# Patient Record
Sex: Male | Born: 1990 | Race: White | Hispanic: No | Marital: Single | Smoking: Current every day smoker
Health system: Southern US, Community
[De-identification: ages and names within clinical notes are randomized; demographics above are authoritative.]

## PROBLEM LIST (undated history)

## (undated) HISTORY — PX: LEG SURGERY: SHX1003

## (undated) HISTORY — PX: APPENDECTOMY: SHX54

---
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
## 2009-09-01 ENCOUNTER — Emergency Department: Payer: Self-pay | Admitting: Emergency Medicine
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
## 2010-01-26 ENCOUNTER — Emergency Department: Payer: Self-pay | Admitting: Emergency Medicine
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
## 2010-03-22 ENCOUNTER — Emergency Department: Payer: Self-pay | Admitting: Emergency Medicine

## 2010-04-17 ENCOUNTER — Emergency Department: Payer: Self-pay | Admitting: Emergency Medicine

## 2010-06-01 ENCOUNTER — Emergency Department: Payer: Self-pay | Admitting: Emergency Medicine
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
## 2016-03-23 ENCOUNTER — Emergency Department: Payer: Self-pay

## 2016-03-23 ENCOUNTER — Encounter: Payer: Self-pay | Admitting: Emergency Medicine

## 2016-03-23 ENCOUNTER — Emergency Department
Admission: EM | Admit: 2016-03-23 | Discharge: 2016-03-23 | Discharge status: Against Medical Advice | Payer: Self-pay | Attending: Emergency Medicine | Admitting: Emergency Medicine

## 2016-03-23 DIAGNOSIS — R079 Chest pain, unspecified: Secondary | ICD-10-CM

## 2016-03-23 DIAGNOSIS — F1410 Cocaine abuse, uncomplicated: Secondary | ICD-10-CM | POA: Insufficient documentation

## 2016-03-23 DIAGNOSIS — F1910 Other psychoactive substance abuse, uncomplicated: Secondary | ICD-10-CM

## 2016-03-23 DIAGNOSIS — F17200 Nicotine dependence, unspecified, uncomplicated: Secondary | ICD-10-CM | POA: Insufficient documentation

## 2016-03-23 LAB — TROPONIN I: Troponin I: <0.03 ng/mL (ref <0.031)

## 2016-03-23 LAB — CBC
HCT: 48 % (ref 40.0–52.0)
Hemoglobin: 16.2 g/dL (ref 13.0–18.0)
MCH: 28.9 pg (ref 26.0–34.0)
MCHC: 33.6 g/dL (ref 32.0–36.0)
MCV: 85.8 fL (ref 80.0–100.0)
Platelets: 300 10*3/uL (ref 150–440)
RBC: 5.59 MIL/uL (ref 4.40–5.90)
RDW: 13.5 % (ref 11.5–14.5)
WBC: 12.5 10*3/uL — AB (ref 3.8–10.6)

## 2016-03-23 LAB — ETHANOL: Alcohol, Ethyl (B): <5 mg/dL (ref <5)

## 2016-03-23 LAB — URINE DRUG SCREEN, QUALITATIVE (ARMC ONLY)
AMPHETAMINES, UR SCREEN: NOT DETECTED
Barbiturates, Ur Screen: NOT DETECTED
Benzodiazepine, Ur Scrn: POSITIVE — AB
CANNABINOID 50 NG, UR ~~LOC~~: POSITIVE — AB
COCAINE METABOLITE, UR ~~LOC~~: POSITIVE — AB
MDMA (ECSTASY) UR SCREEN: NOT DETECTED
Methadone Scn, Ur: NOT DETECTED
Opiate, Ur Screen: NOT DETECTED
Phencyclidine (PCP) Ur S: NOT DETECTED
TRICYCLIC, UR SCREEN: NOT DETECTED

## 2016-03-23 LAB — COMPREHENSIVE METABOLIC PANEL
ALBUMIN: 4.7 g/dL (ref 3.5–5.0)
ALT: 19 U/L (ref 17–63)
ANION GAP: 14 (ref 5–15)
AST: 26 U/L (ref 15–41)
Alkaline Phosphatase: 63 U/L (ref 38–126)
BUN: 16 mg/dL (ref 6–20)
CO2: 21 mmol/L — AB (ref 22–32)
Calcium: 9.7 mg/dL (ref 8.9–10.3)
Chloride: 104 mmol/L (ref 101–111)
Creatinine, Ser: 1.16 mg/dL (ref 0.61–1.24)
GFR calc Af Amer: >60 mL/min (ref >60)
GFR calc non Af Amer: >60 mL/min (ref >60)
GLUCOSE: 188 mg/dL — AB (ref 65–99)
Potassium: 3.1 mmol/L — ABNORMAL LOW (ref 3.5–5.1)
SODIUM: 139 mmol/L (ref 135–145)
Total Bilirubin: 0.8 mg/dL (ref 0.3–1.2)
Total Protein: 7.9 g/dL (ref 6.5–8.1)

## 2016-03-23 IMAGING — DX DG CHEST 1V PORT
1 series · 2 of 2 positions shown · non-contrast
Comparison: None.

CLINICAL DATA: Chest pain and anxiety after drug use, tachycardia
and hypertension.

EXAM:
PORTABLE CHEST 1 VIEW

[Series 1: chest ap · 0.14mm/px · 2 of 2 slices shown]
[im 1/2]
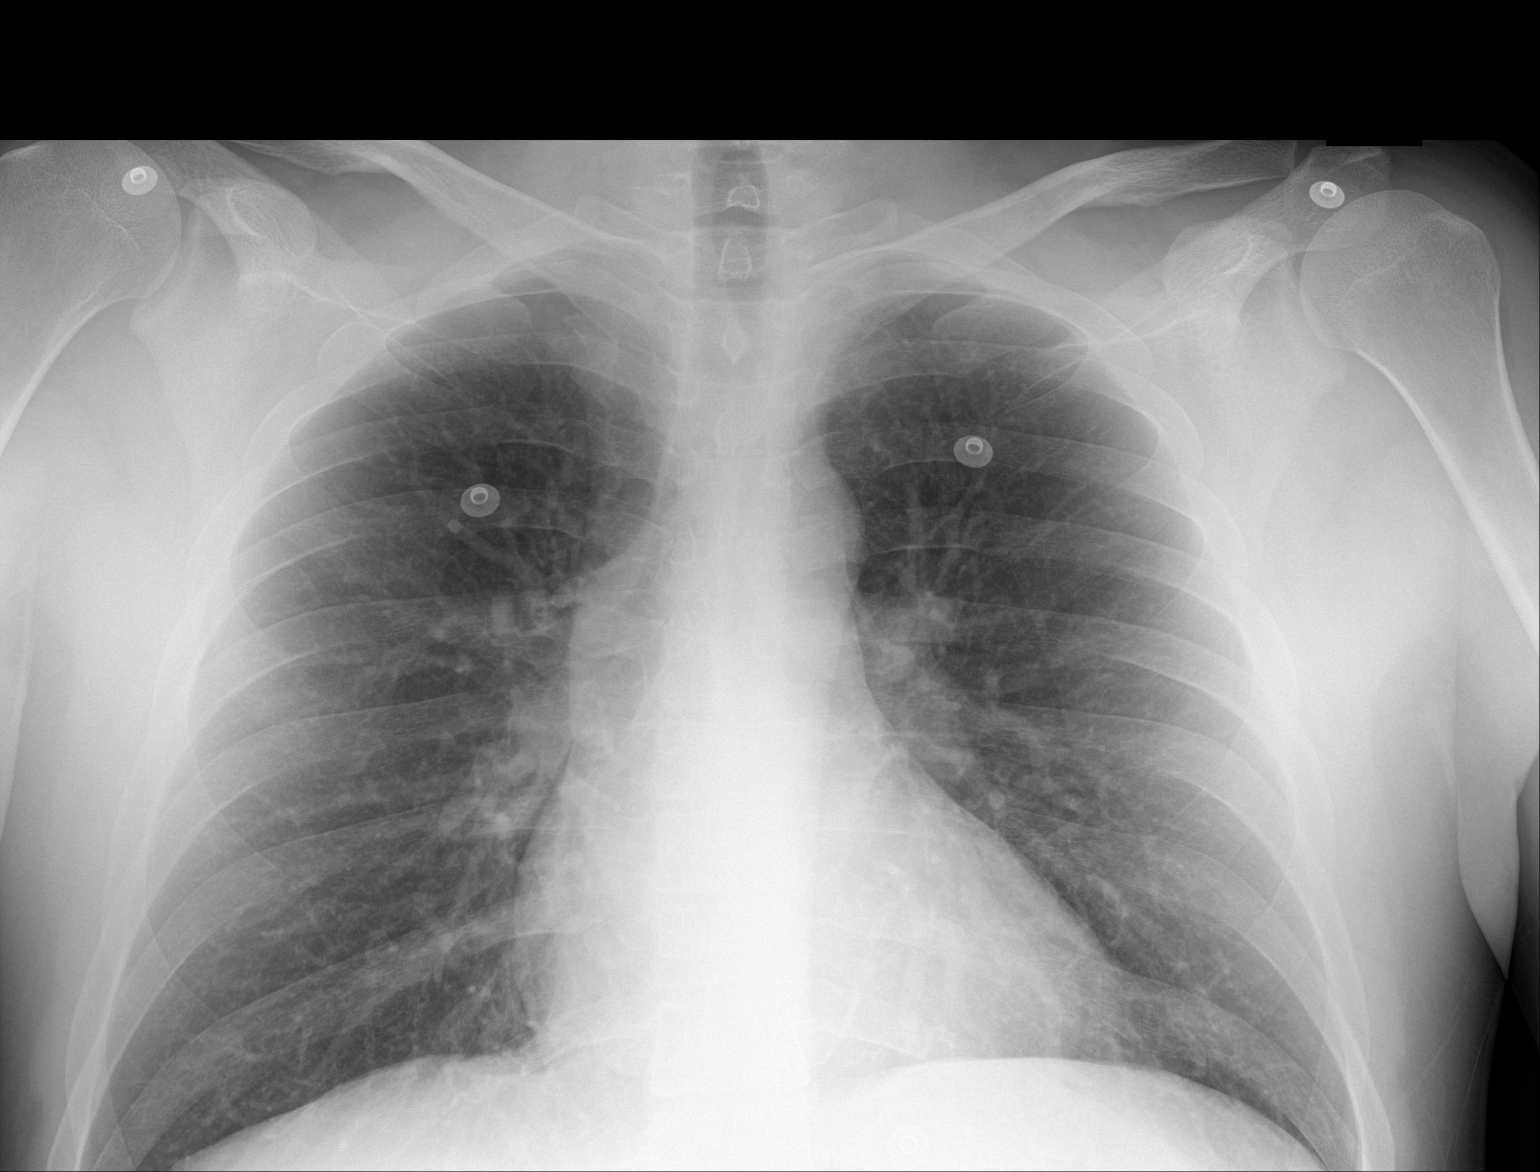
[im 2/2]
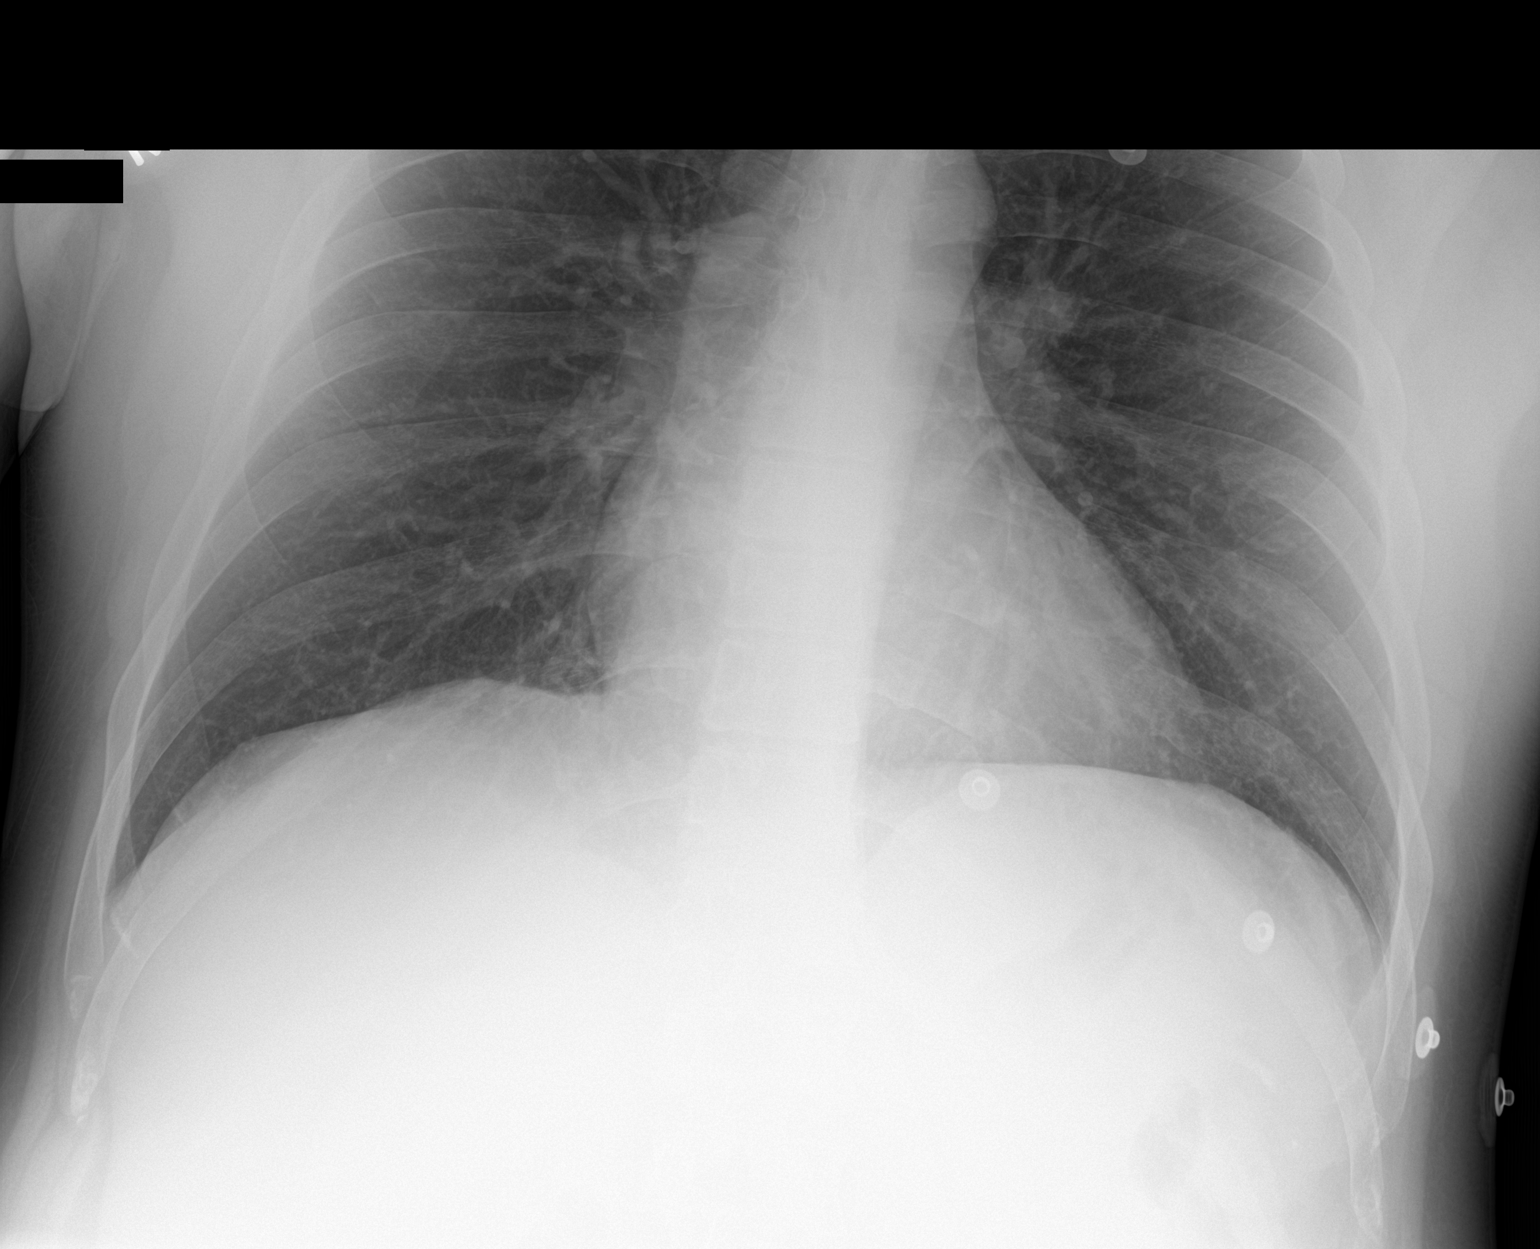

[2 of 2 positions shown; findings below may reference images not displayed]

FINDINGS: Cardiomediastinal silhouette is normal. Lung bases out of
field-of-view. The lungs are clear without pleural effusions or
focal consolidations. Trachea projects midline and there is no
pneumothorax. Soft tissue planes and included osseous structures are
non-suspicious.
IMPRESSION: Normal chest.

## 2016-03-23 MED ORDER — MAGNESIUM SULFATE 2 GM/50ML IV SOLN
2.0000 g | Freq: Once | INTRAVENOUS | Status: AC
Start: 2016-03-23 — End: 2016-03-23
  Administered 2016-03-23: 2 g via INTRAVENOUS
  Filled 2016-03-23: qty 50

## 2016-03-23 MED ORDER — LORAZEPAM 2 MG/ML IJ SOLN
1.0000 mg | Freq: Once | INTRAMUSCULAR | Status: AC
  Administered 2016-03-23: 1 mg via INTRAVENOUS

## 2016-03-23 MED ORDER — GI COCKTAIL ~~LOC~~
30.0000 mL | Freq: Once | ORAL | Status: AC
  Administered 2016-03-23: 30 mL via ORAL

## 2016-03-23 MED ORDER — SODIUM CHLORIDE 0.9 % IV BOLUS (SEPSIS)
1000.0000 mL | Freq: Once | INTRAVENOUS | Status: AC
  Administered 2016-03-23: 1000 mL via INTRAVENOUS

## 2016-03-23 MED ORDER — GI COCKTAIL ~~LOC~~
ORAL | Status: AC
  Administered 2016-03-23: 30 mL via ORAL
  Filled 2016-03-23: qty 30

## 2016-03-23 MED ORDER — LORAZEPAM 2 MG/ML IJ SOLN
1.0000 mg | Freq: Once | INTRAMUSCULAR | Status: AC
  Administered 2016-03-23: 1 mg via INTRAVENOUS
  Filled 2016-03-23: qty 1

## 2016-03-23 NOTE — ED Notes (Signed)
This Clinical research associatewriter went in for hourly rounding and pt voiced that he was ready to go home. Pt agrees to sign out AMA. edp aware and form was signed. No acute distress noted at time of leaving.

## 2016-03-23 NOTE — Discharge Instructions (Signed)
Your chest pain has not been fully evaluated. You will need a repeat troponin. 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 You understand the risk that you take with leaving the hospital and not knowing if there has been damage done to your heart from the drugs and you accept that risk when you sign out against medical advice. You may return for further/continued evaluation at any time.   Nonspecific Chest Pain  Chest pain can be caused by many different conditions. There is always a chance that your pain could be related to something serious, such as a heart attack or a blood clot in your lungs. Chest pain can also be caused by conditions that are not life-threatening. If you have chest pain, it is very important to follow up with your health care provider. CAUSES  Chest pain can be caused by:  Heartburn.  Pneumonia or bronchitis.  Anxiety or stress.  Inflammation around your heart (pericarditis) or lung (pleuritis or pleurisy).  A blood clot in your lung.  A collapsed lung (pneumothorax). It can develop suddenly on its own (spontaneous pneumothorax) or from trauma to the chest.  Shingles infection (varicella-zoster virus).  Heart attack.  Damage to the bones, muscles, and cartilage that make up your chest wall. This can include:  Bruised bones due to injury.  Strained muscles or cartilage due to frequent or repeated coughing or overwork.  Fracture to one or more ribs.  Sore cartilage due to inflammation (costochondritis). RISK FACTORS  Risk factors for chest pain may include:  Activities that increase your risk for trauma or injury to your chest.  Respiratory infections or conditions that cause frequent coughing.  Medical conditions or overeating that can cause heartburn.  Heart disease or family history of heart disease.  Conditions or health behaviors that increase your risk of developing a blood clot.  Having had chicken pox  (varicella zoster). SIGNS AND SYMPTOMS Chest pain can feel like:  Burning or tingling on the surface of your chest or deep in your chest.  Crushing, pressure, aching, or squeezing pain.  Dull or sharp pain that is worse when you move, cough, or take a deep breath.  Pain that is also felt in your back, neck, shoulder, or arm, or pain that spreads to any of these areas. Your chest pain may come and go, or it may stay constant. DIAGNOSIS Lab tests or other studies may be needed to find the cause of your pain. Your health care provider may have you take a test called an ambulatory ECG (electrocardiogram). An ECG records your heartbeat patterns at the time the test is performed. You may also have other tests, such as:  Transthoracic echocardiogram (TTE). During echocardiography, sound waves are used to create a picture of all of the heart structures and to look at how blood flows through your heart.  Transesophageal echocardiogram (TEE).This is a more advanced imaging test that obtains images from inside your body. It allows your health care provider to see your heart in finer detail.  Cardiac monitoring. This allows your health care provider to monitor your heart rate and rhythm in real time.  Holter monitor. This is a portable device that records your heartbeat and can help to diagnose abnormal heartbeats. It allows your health care provider to track your heart activity for several days, if needed.  Stress tests. These can be done through exercise or by taking medicine that makes your heart beat more quickly.  Blood tests.  Imaging tests. TREATMENT  Your treatment depends on what is causing your chest pain. Treatment may include:  Medicines. These may include:  Acid blockers for heartburn.  Anti-inflammatory medicine.  Pain medicine for inflammatory conditions.  Antibiotic medicine, if an infection is present.  Medicines to dissolve blood clots.  Medicines to treat coronary  artery disease.  Supportive care for conditions that do not require medicines. This may include:  Resting.  Applying heat or cold packs to injured areas.  Limiting activities until pain decreases. HOME CARE INSTRUCTIONS  If you were prescribed an antibiotic medicine, finish it all even if you start to feel better.  Avoid any activities that bring on chest pain.  Do not use any tobacco products, including cigarettes, chewing tobacco, or electronic cigarettes. If you need help quitting, ask your health care provider.  Do not drink alcohol.  Take medicines only as directed by your health care provider.  Keep all follow-up visits as directed by your health care provider. This is important. This includes any further testing if your chest pain does not go away.  If heartburn is the cause for your chest pain, you may be told to keep your head raised (elevated) while sleeping. This reduces the chance that acid will go from your stomach into your esophagus.  Make lifestyle changes as directed by your health care provider. These may include:  Getting regular exercise. Ask your health care provider to suggest some activities that are safe for you.  Eating a heart-healthy diet. A registered dietitian can help you to learn healthy eating options.  Maintaining a healthy weight.  Managing diabetes, if necessary.  Reducing stress. SEEK MEDICAL CARE IF:  Your chest pain does not go away after treatment.  You have a rash with blisters on your chest.  You have a fever. SEEK IMMEDIATE MEDICAL CARE IF:   Your chest pain is worse.  You have an increasing cough, or you cough up blood.  You have severe abdominal pain.  You have severe weakness.  You faint.  You have chills.  You have sudden, unexplained chest discomfort.  You have sudden, unexplained discomfort in your arms, back, neck, or jaw.  You have shortness of breath at any time.  You suddenly start to sweat, or your  skin gets clammy.  You feel nauseous or you vomit.  You suddenly feel light-headed or dizzy.  Your heart begins to beat quickly, or it feels like it is skipping beats. These symptoms may represent a serious problem that is an emergency. Do not wait to see if the symptoms will go away. Get medical help right away. Call your local emergency services (911 in the [place] Do not drive yourself to the hospital.   This information is not intended to replace advice given to you by your health care provider. Make sure you discuss any questions you have with your health care provider.   Document Released: 08/19/2005 Document Revised: 11/30/2014 Document Reviewed: 06/15/2014 Elsevier Interactive Patient Education 2016 [employer]  Polysubstance Abuse When people abuse more than one drug or type of drug it is called polysubstance or polydrug abuse. For example, many smokers also drink alcohol. This is one form of polydrug abuse. Polydrug abuse also refers to the use of a drug to counteract an unpleasant effect produced by another drug. It may also be used to help with withdrawal from another drug. People who take stimulants may become agitated. Sometimes this agitation is countered with a tranquilizer. This  helps protect against the unpleasant side effects. Polydrug abuse also refers to the use of different drugs at the same time.  Anytime drug use is interfering with normal living activities, it has become abuse. This includes problems with family and friends. Psychological dependence has developed when your mind tells you that the drug is needed. This is usually followed by physical dependence which has developed when continuing increases of drug are required to get the same feeling or "high". This is known as addiction or chemical dependency. A person's risk is much higher if there is a history of chemical dependency in the family. SIGNS OF CHEMICAL DEPENDENCY  You have been told by friends or family  that drugs have become a problem.  You fight when using drugs.  You are having blackouts (not remembering what you do while using).  You feel sick from using drugs but continue using.  You lie about use or amounts of drugs (chemicals) used.  You need chemicals to get you going.  You are suffering in work performance or in school because of drug use.  You get sick from use of drugs but continue to use anyway.  You need drugs to relate to people or feel comfortable in social situations.  You use drugs to forget problems. "Yes" answered to any of the above signs of chemical dependency indicates there are problems. The longer the use of drugs continues, the greater the problems will become. If there is a family history of drug or alcohol use, it is best not to experiment with these drugs. Continual use leads to tolerance. After tolerance develops more of the drug is needed to get the same feeling. This is followed by addiction. With addiction, drugs become the most important part of life. It becomes more important to take drugs than participate in the other usual activities of life. This includes relating to friends and family. Addiction is followed by dependency. Dependency is a condition where drugs are now needed not just to get high, but to feel normal. Addiction cannot be cured but it can be stopped. This often requires outside help and the care of professionals. Treatment centers are listed in the yellow pages under: Cocaine, Narcotics, and Alcoholics Anonymous. Most hospitals and clinics can refer you to a specialized care center. Talk to your caregiver if you need help.   This information is not intended to replace advice given to you by your health care provider. Make sure you discuss any questions you have with your health care provider.   Document Released: 07/01/2005 Document Revised: 02/01/2012 Document Reviewed: 11/14/2014 Elsevier Interactive Patient Education [employer].
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
## 2016-03-23 NOTE — ED Notes (Signed)
Pt intermittently very anxious...worried about variations in heart rate and the feeling that his heart is racing and pounding. Explained to pt that given what he took these feelings are normal and that variations in heart rate when changing positions are normal. Reassured pt that we are monitoring his vital signs very carefully and that we can see everything on his monitor out at the desk and that if there are any abnormalities with his heart rhythm we will be able to see them. Assisted pt to calm himself down with some deep breathing.  Pt more relaxed afterwards and resting with eyes closed.

## 2016-03-23 NOTE — ED Provider Notes (Signed)
[facility] Emergency Department Provider Note   ____________________________________________  Time seen: Approximately 349 AM  I have reviewed the triage vital signs and the nursing notes.   HISTORY  Chief Complaint Drug Overdose    HPI Danny Turner is a 25 y.o. male who came into the hospital today with some chest pain and tachycardia. According to EMS the patient was doing [name] in cocaine and developed some chest pain. EMS reports that his heart rate was in the 160s and his blood pressure in the 170s. The patient was given Versed 2.5 mg and 4 baby aspirin. The patient also received nitroglycerin but he still having some pain. According to EMS the patient's girlfriend died of an overdose recently.The patient reports he feels as though he can't breathe and his arms are tingling. The patient does have a history of substance abuse and also reports that he buys [name] off the street. He reports that the symptoms started about 30 minutes ago but he used about an hour and a half ago. He reports he has never used [name] before but he has done cocaine. He is very anxious and keeps grabbing his neck to feel his pulse.   History reviewed. No pertinent past medical history.  There are no active problems to display for this patient.   Past Surgical History  Procedure Laterality Date  . Appendectomy    . Leg surgery Left     Current Outpatient Rx  Name  Route  Sig  Dispense  Refill  . buprenorphine-naloxone ([name]) 8-2 MG SUBL SL tablet   Sublingual   Place 1 tablet under the tongue daily.           Allergies Review of patient's allergies indicates no known allergies.  No family history on file.  Social History Social History  Substance Use Topics  . Smoking status: Current Every Day Smoker  . Smokeless tobacco: None  . Alcohol Use: Yes    Review of Systems Constitutional: No fever/chills Eyes: No visual changes. ENT: No sore  throat. Cardiovascular:  chest pain. Respiratory:  shortness of breath. Gastrointestinal: No abdominal pain.  No nausea, no vomiting.  No diarrhea.  No constipation. Genitourinary: Negative for dysuria. Musculoskeletal: Negative for back pain. Skin: Negative for rash. Neurological: Arm tingling  10-point ROS otherwise negative.  ____________________________________________   PHYSICAL EXAM:  VITAL SIGNS: ED Triage Vitals  Enc Vitals Group     BP 03/23/16 0352 140/81 mmHg     Pulse Rate 03/23/16 0352 157     Resp 03/23/16 0352 21     Temp 03/23/16 0352 99.4 F (37.4 C)     Temp Source 03/23/16 0352 Oral     SpO2 03/23/16 0352 99 %     Weight 03/23/16 0352 185 lb (83.915 kg)     Height 03/23/16 0352 5\' 10"  (1.778 m)     Head Cir --      Peak Flow --      Pain Score 03/23/16 0353 8     Pain Loc --      Pain Edu? --      Excl. in GC? --     Constitutional: Alert and oriented. Anxious appearing and in moderate distress. Eyes: Conjunctivae are normal. PERRL. EOMI. Head: Atraumatic. Nose: No congestion/rhinnorhea. Mouth/Throat: Mucous membranes are moist.  Oropharynx non-erythematous. Cardiovascular: Tachycardia regular rhythm. Grossly normal heart sounds.  Good peripheral circulation. Respiratory: Normal respiratory effort.  No retractions. Lungs CTAB. Gastrointestinal: Soft and nontender. No distention.  Positive bowel sounds Musculoskeletal: No lower extremity tenderness nor edema.   Neurologic:  Normal speech and language.  Skin:  Skin is warm, dry and intact.  Psychiatric: Mood and affect are normal.   ____________________________________________   LABS (all labs ordered are listed, but only abnormal results are displayed)  Labs Reviewed  CBC - Abnormal; Notable for the following:    WBC 12.5 (*)    All other components within normal limits  COMPREHENSIVE METABOLIC PANEL - Abnormal; Notable for the following:    Potassium 3.1 (*)    CO2 21 (*)    Glucose, Bld  188 (*)    All other components within normal limits  URINE DRUG SCREEN, QUALITATIVE ([facility] ONLY) - Abnormal; Notable for the following:    Cocaine Metabolite,Ur [place] POSITIVE (*)    Cannabinoid 50 Ng, Ur [place] POSITIVE (*)    Benzodiazepine, Ur [place] POSITIVE (*)    All other components within normal limits  TROPONIN I  ETHANOL   ____________________________________________  EKG  ED ECG REPORT I, [name],  Allison P, the attending physician, personally viewed and interpreted this ECG.   Date: 03/23/2016  EKG Time: 351  Rate: 157  Rhythm: sinus tachycardia  Axis: normal  Intervals:prolonged qtc  ST&T Change: none  ____________________________________________  RADIOLOGY  CXR: Normal chest ____________________________________________   PROCEDURES  Procedure(s) performed: None  Critical Care performed: No  ____________________________________________   INITIAL IMPRESSION / ASSESSMENT AND PLAN / ED COURSE  Pertinent labs & imaging results that were available during my care of the patient were reviewed by me and considered in my medical decision making (see chart for details).  This is a 25 year old male who comes into the hospital today with some chest pain and tachycardia after doing illegal substances. The patient did receive a liter of normal saline as well as 2 mg of Ativan. I will await the results of his blood work and give him some more fluids.  After the fluid and the Ativan the patient's heart rate did begin to improve. He did remain anxious though while in the emergency department. He called myself and the nurse and his room multiple times asking what we were doing and why weren't doing anything for him. I did explain to the patient that he's having these symptoms because of the drugs that he is used. I informed him that the anxiety is due to the new medication and we are evaluating and following his heart. The patient reports that 2 of Ativan is not enough for him and  it will not do anything to help with his symptoms. I again continued to encourage the patient that it is what he needs and I do not want him to be too sleepy given that he had done so many different illegal substances. The patient continued to have improvement in his heart rate although every time he moved or looked at the monitor he became more anxious. The patient began complaining of some heartburn symptoms so I did give him a GI cocktail. I informed him that we needed to repeat his troponin at 3 hours to ensure that he did not damage his heart using drugs. The patient did not have any complaint at that time. I did receive word from the nurse after about 15 minutes at the patient did not want to stay anymore and he wanted to go. His decision was to sign out AGAINST MEDICAL ADVICE. Patient was informed of the risks that he was taking in signing out but  he said he just wants to go home. The patient received paperwork as well as AGAINST MEDICAL ADVICE paperwork and will be discharged AGAINST MEDICAL ADVICE. ____________________________________________   FINAL CLINICAL IMPRESSION(S) / ED DIAGNOSES  Final diagnoses:  Substance abuse  Chest pain, unspecified chest pain type      NEW MEDICATIONS STARTED DURING THIS VISIT:  Discharge Medication List as of 03/23/2016  7:23 AM       Note:  This document was prepared using Dragon voice recognition software and may include unintentional dictation errors.    Rebecka Apley, MD 03/23/16 [phone]

## 2016-03-23 NOTE — ED Notes (Signed)
Pt attempting to urinate in urinal. ivf continue to infuse.

## 2017-06-13 ENCOUNTER — Emergency Department: Payer: Self-pay

## 2017-06-13 ENCOUNTER — Encounter: Payer: Self-pay | Admitting: Emergency Medicine

## 2017-06-13 ENCOUNTER — Emergency Department
Admission: EM | Admit: 2017-06-13 | Discharge: 2017-06-13 | Disposition: A | Discharge status: Home / Self Care | Payer: Self-pay | Attending: Emergency Medicine | Admitting: Emergency Medicine

## 2017-06-13 DIAGNOSIS — F1990 Other psychoactive substance use, unspecified, uncomplicated: Secondary | ICD-10-CM

## 2017-06-13 DIAGNOSIS — F1510 Other stimulant abuse, uncomplicated: Secondary | ICD-10-CM

## 2017-06-13 DIAGNOSIS — R51 Headache: Secondary | ICD-10-CM | POA: Insufficient documentation

## 2017-06-13 DIAGNOSIS — R079 Chest pain, unspecified: Secondary | ICD-10-CM

## 2017-06-13 DIAGNOSIS — F17210 Nicotine dependence, cigarettes, uncomplicated: Secondary | ICD-10-CM | POA: Insufficient documentation

## 2017-06-13 LAB — URINE DRUG SCREEN, QUALITATIVE (ARMC ONLY)
Amphetamines, Ur Screen: POSITIVE — AB
Barbiturates, Ur Screen: NOT DETECTED
Benzodiazepine, Ur Scrn: POSITIVE — AB
CANNABINOID 50 NG, UR ~~LOC~~: NOT DETECTED
COCAINE METABOLITE, UR ~~LOC~~: NOT DETECTED
MDMA (ECSTASY) UR SCREEN: NOT DETECTED
Methadone Scn, Ur: NOT DETECTED
OPIATE, UR SCREEN: NOT DETECTED
Phencyclidine (PCP) Ur S: NOT DETECTED
Tricyclic, Ur Screen: NOT DETECTED

## 2017-06-13 LAB — COMPREHENSIVE METABOLIC PANEL
ALBUMIN: 5 g/dL (ref 3.5–5.0)
ALK PHOS: 57 U/L (ref 38–126)
ALT: 20 U/L (ref 17–63)
ANION GAP: 11 (ref 5–15)
AST: 19 U/L (ref 15–41)
BUN: 14 mg/dL (ref 6–20)
CALCIUM: 10.4 mg/dL — AB (ref 8.9–10.3)
CO2: 26 mmol/L (ref 22–32)
Chloride: 103 mmol/L (ref 101–111)
Creatinine, Ser: 1.03 mg/dL (ref 0.61–1.24)
GFR calc Af Amer: >60 mL/min (ref >60)
GFR calc non Af Amer: >60 mL/min (ref >60)
GLUCOSE: 102 mg/dL — AB (ref 65–99)
POTASSIUM: 3.6 mmol/L (ref 3.5–5.1)
SODIUM: 140 mmol/L (ref 135–145)
Total Bilirubin: 3 mg/dL — ABNORMAL HIGH (ref 0.3–1.2)
Total Protein: 8.2 g/dL — ABNORMAL HIGH (ref 6.5–8.1)

## 2017-06-13 LAB — CBC
HCT: 51.8 % (ref 40.0–52.0)
HEMOGLOBIN: 18 g/dL (ref 13.0–18.0)
MCH: 31.2 pg (ref 26.0–34.0)
MCHC: 34.9 g/dL (ref 32.0–36.0)
MCV: 89.6 fL (ref 80.0–100.0)
Platelets: 380 10*3/uL (ref 150–440)
RBC: 5.78 MIL/uL (ref 4.40–5.90)
RDW: 13.3 % (ref 11.5–14.5)
WBC: 10.6 10*3/uL (ref 3.8–10.6)

## 2017-06-13 LAB — TROPONIN I
Troponin I: <0.03 ng/mL (ref <0.03)
Troponin I: <0.03 ng/mL (ref <0.03)

## 2017-06-13 LAB — ETHANOL: Alcohol, Ethyl (B): <5 mg/dL (ref <5)

## 2017-06-13 LAB — CK: Total CK: 117 U/L (ref 49–397)

## 2017-06-13 IMAGING — CT CT HEAD W/O CM
3 series · 16 of 47 positions shown, 19 images · non-contrast
Comparison: Prior CT scan the head [DATE]

CLINICAL DATA: 26-year-old male with chest pain following several
day methamphetamine binge

EXAM:
CT HEAD WITHOUT CONTRAST
TECHNIQUE: Contiguous axial images were obtained from the base of the skull
through the vertex without intravenous contrast.

[Series 2: head wo · axial · 0.43mm/px · z∈[-135,+0]mm · 10 of 33 slices shown, 13 images]
[im 3/33  brain]
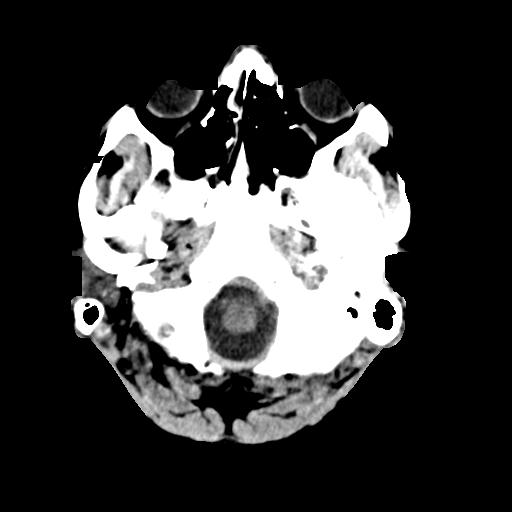
[im 3/33  bone]
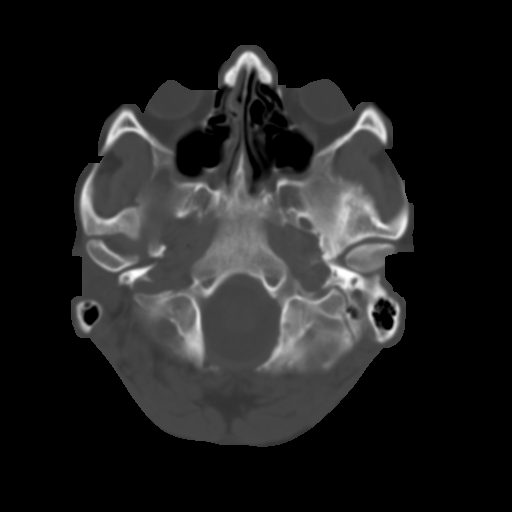
[im 6/33  brain]
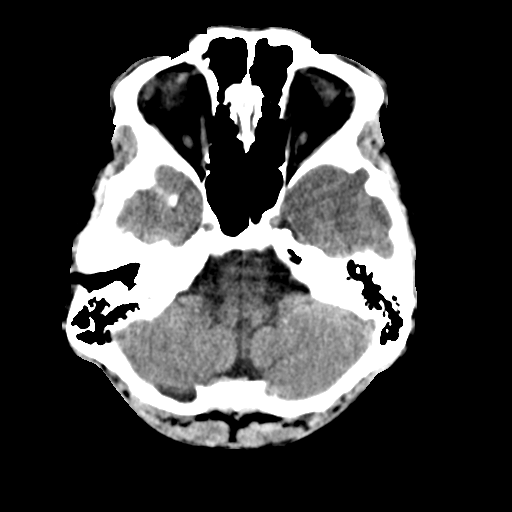
[im 9/33  brain]
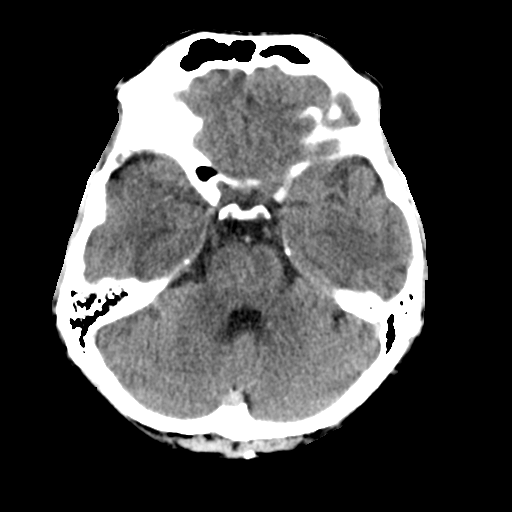
[im 12/33  brain]
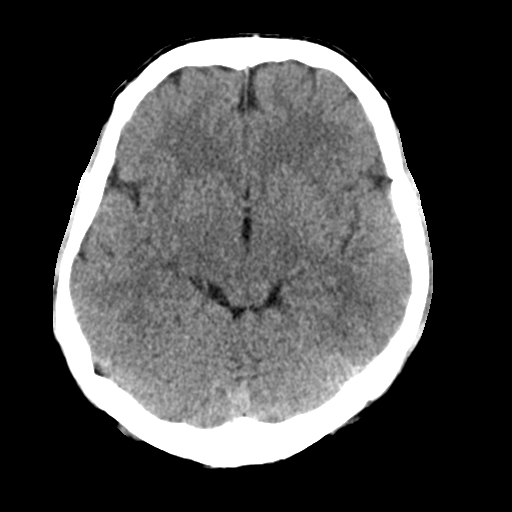
[im 15/33  brain]
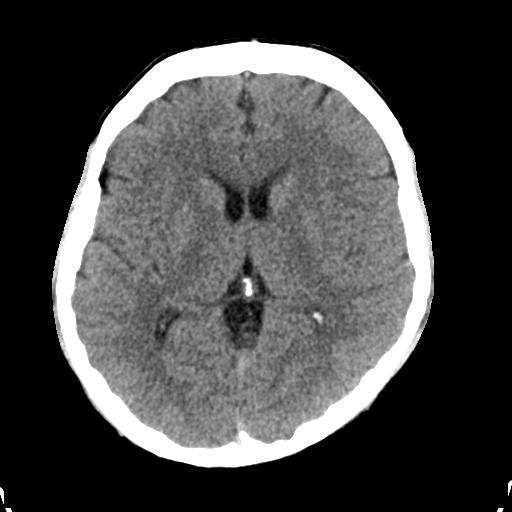
[im 15/33  bone]
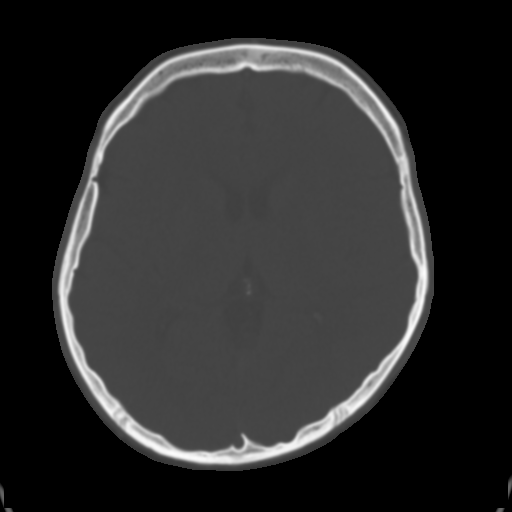
[im 18/33  brain]
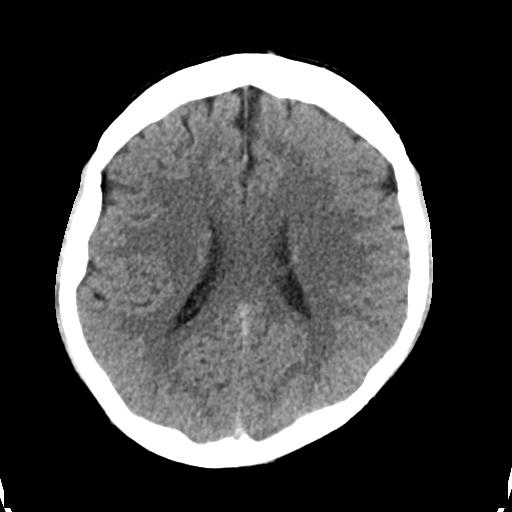
[im 21/33  brain]
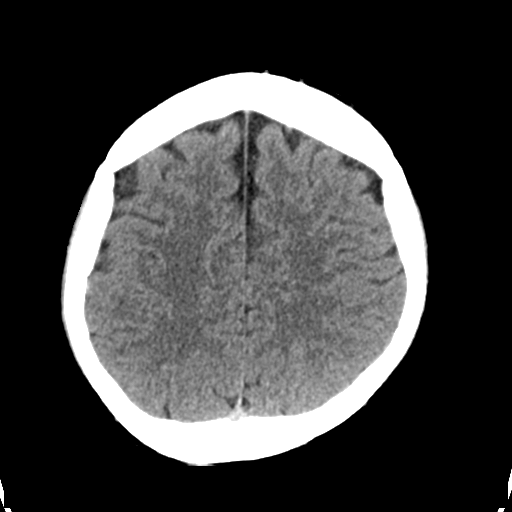
[im 25/33  brain]
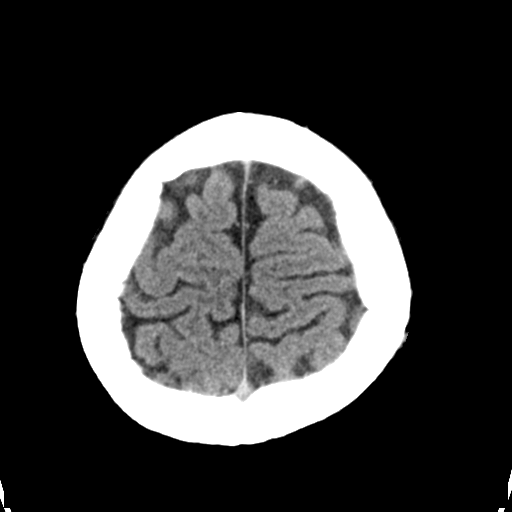
[im 27/33  brain]
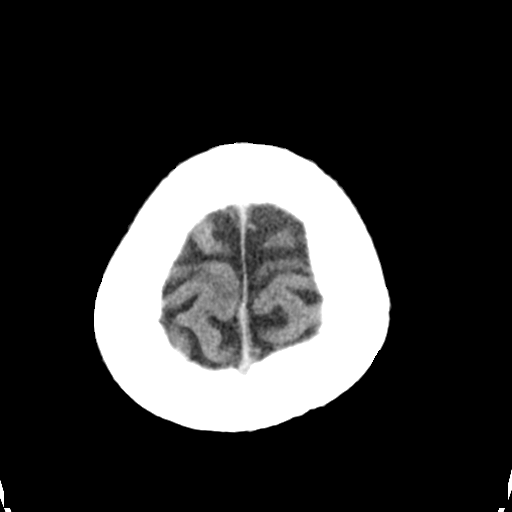
[im 27/33  bone]
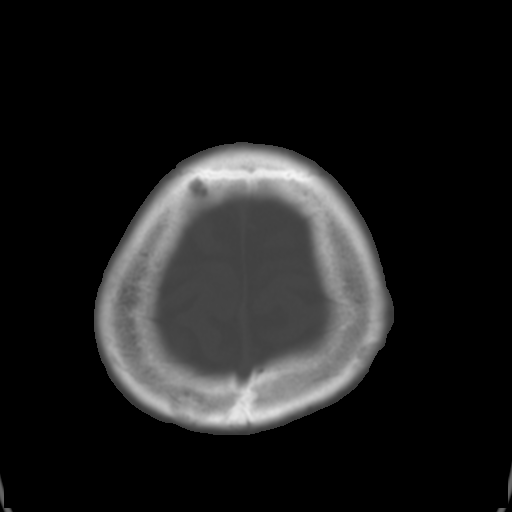
[im 30/33  brain]
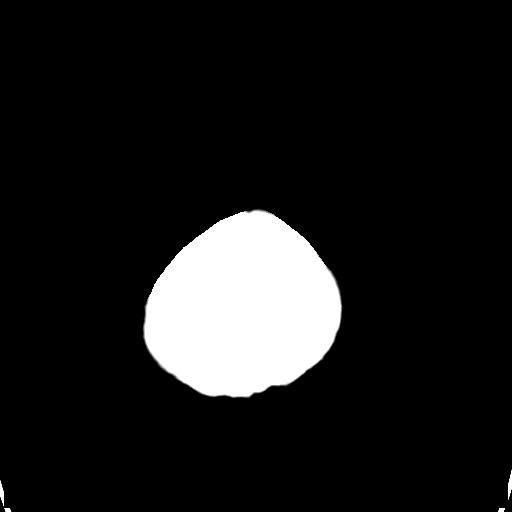

[Series 4: coronal soft tissue · coronal · 0.33mm/px · 3 of 66 slices shown]
[im 22/66  brain]
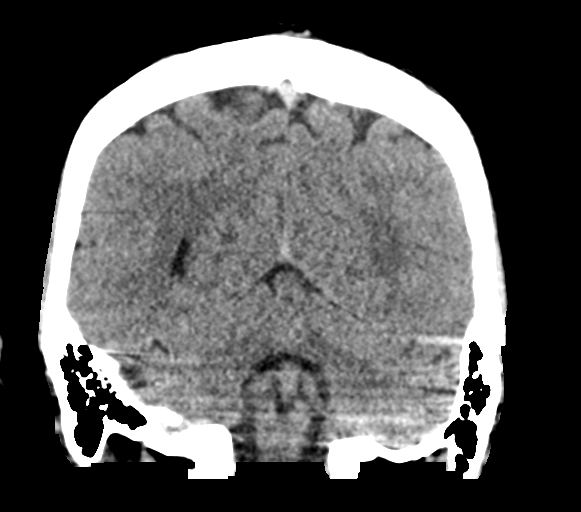
[im 29/66  brain]
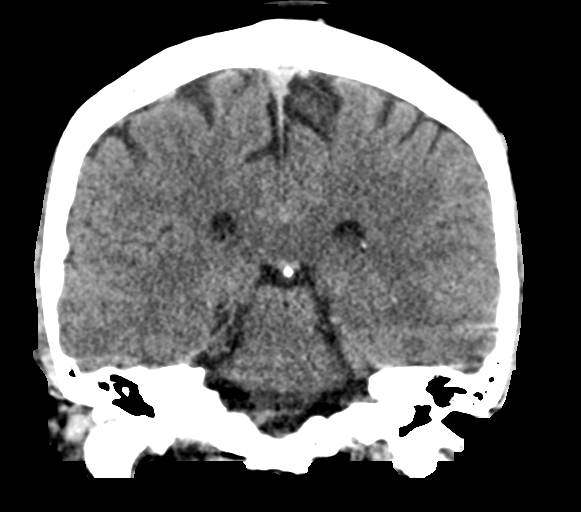
[im 37/66  brain]
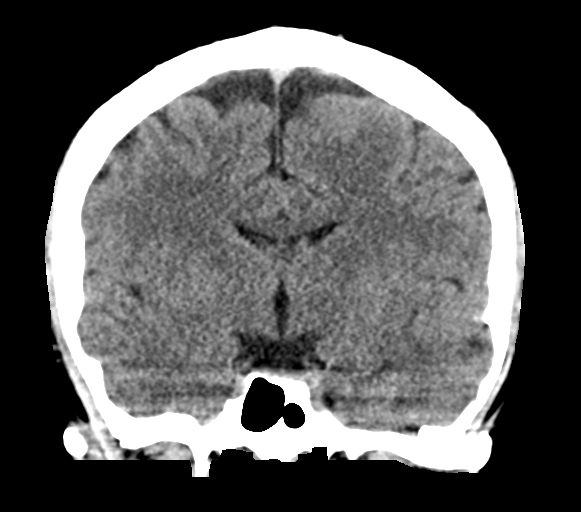

[Series 5: sagittal soft tissue · sagittal · 0.33mm/px · 3 of 60 slices shown]
[im 20/60  brain]
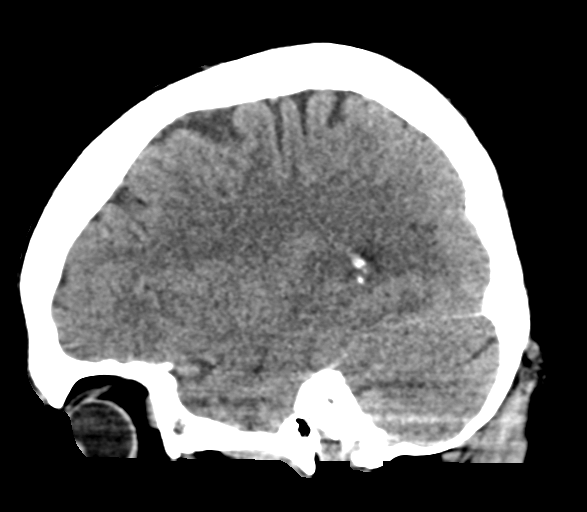
[im 30/60  brain]
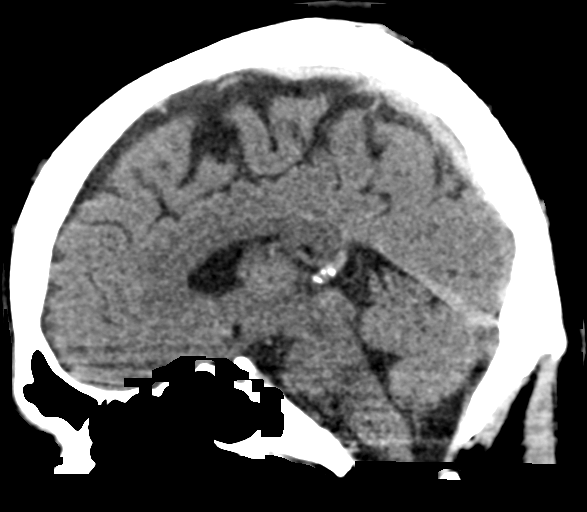
[im 40/60  brain]
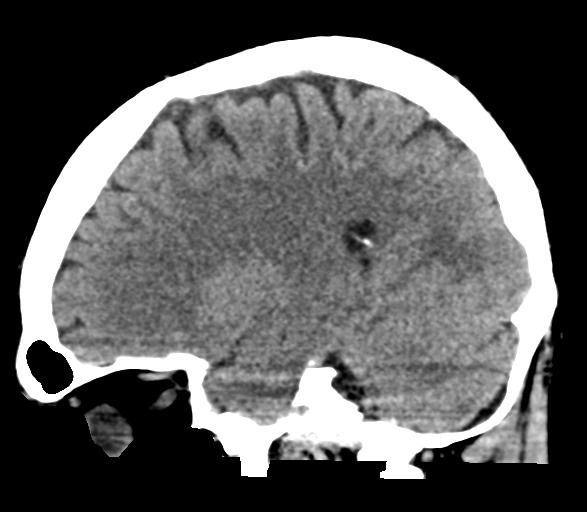

[16 of 47 positions shown; findings below may reference images not displayed]

FINDINGS: Brain: No evidence of acute infarction, hemorrhage, hydrocephalus,
extra-axial collection or mass lesion/mass effect.

Vascular: No hyperdense vessel or unexpected calcification.

Skull: Normal. Negative for fracture or focal lesion.

Sinuses/Orbits: No acute finding.

Other: None.
IMPRESSION: Negative head CT.

## 2017-06-13 IMAGING — DX DG CHEST 1V PORT
1 series · 1 of 1 positions shown · non-contrast
Comparison: Portable exam [1H] hours compared to [DATE]

CLINICAL DATA: Chest pain, was snorting and smoking meth all night

EXAM:
PORTABLE CHEST 1 VIEW

[chest ap]
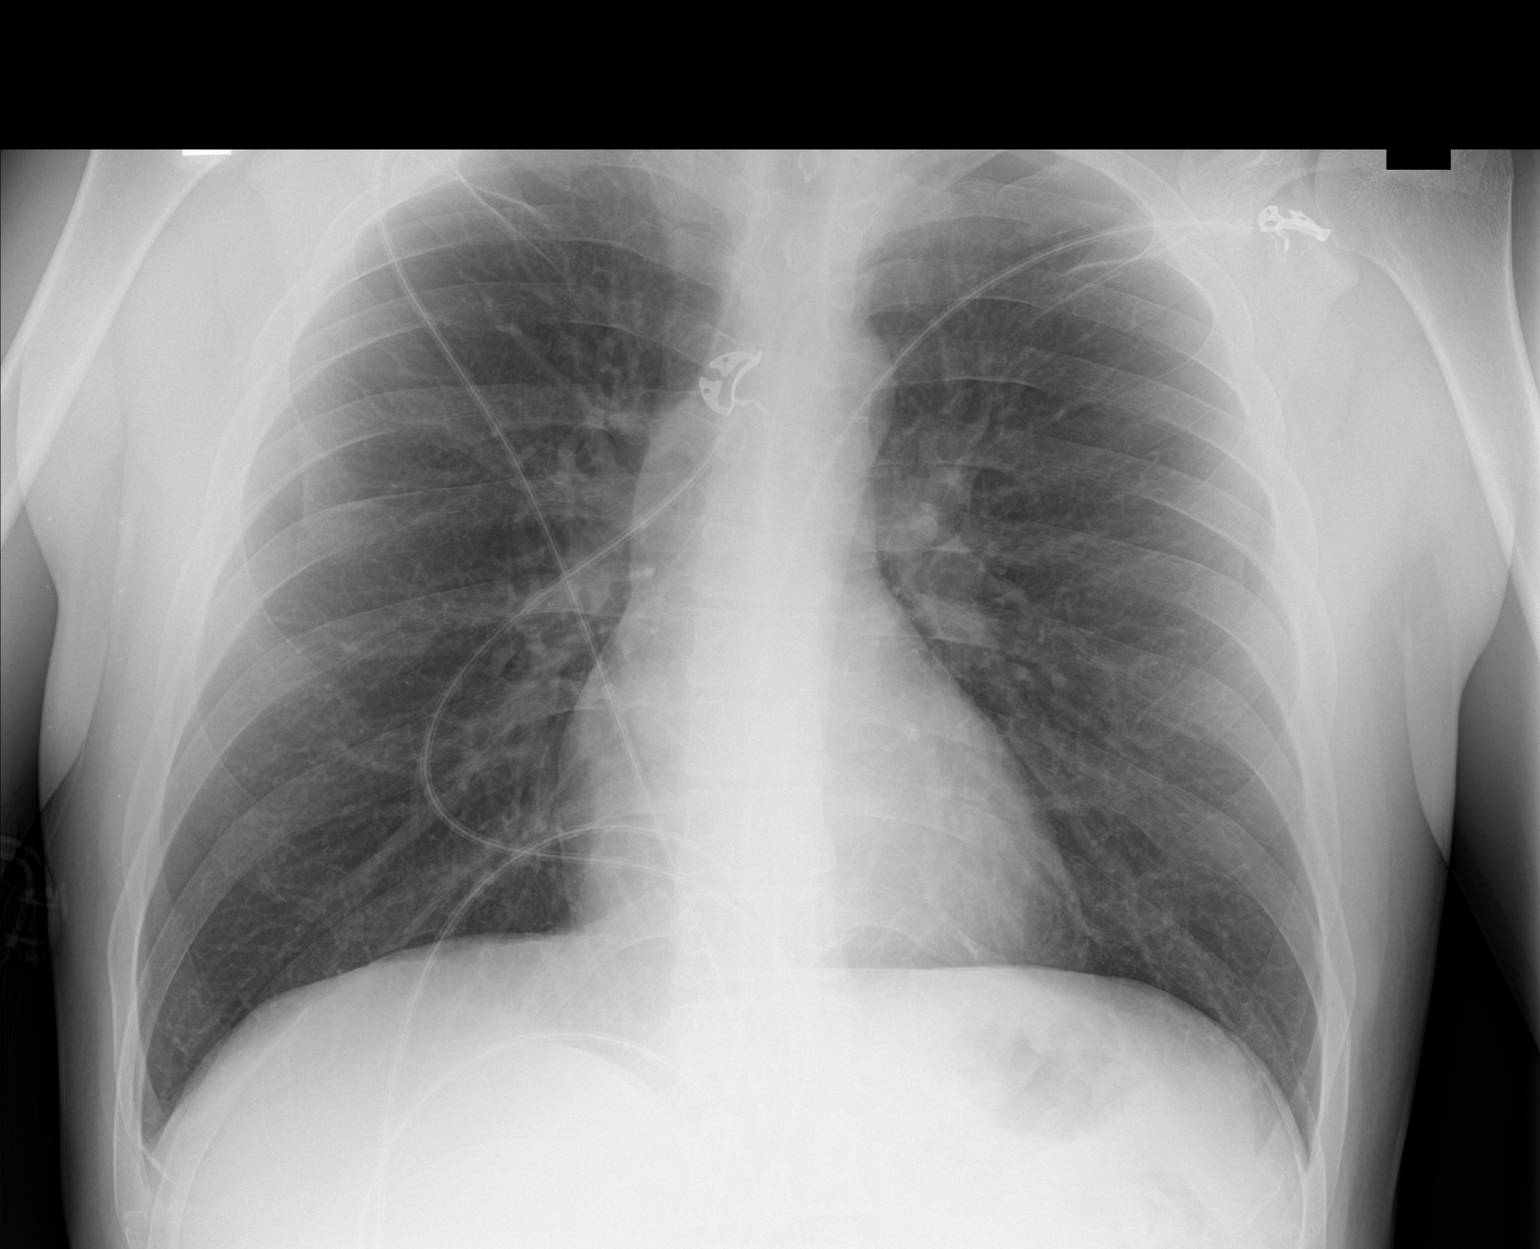

[1 of 1 positions shown; findings below may reference images not displayed]

FINDINGS: Normal heart size, mediastinal contours, and pulmonary vascularity.

Lungs clear.

No pleural effusion or pneumothorax.

Bones unremarkable.
IMPRESSION: No acute abnormalities.

## 2017-06-13 IMAGING — CT CT CHEST W/ CM
2 of 3 series · 15 of 36 positions shown, 18 images · IV contrast (iopamidol)
Comparison: Chest radiograph [DATE]

CLINICAL DATA: [place] amphetamine use over the last few days.
Headache, agitation, and chest pain. Back pain.

EXAM:
CT CHEST WITH CONTRAST
TECHNIQUE: Multidetector CT imaging of the chest was performed during
intravenous contrast administration.
CONTRAST:  75mL [LP] IOPAMIDOL ([LP]) INJECTION 61%

[Series 2: axial st · axial · 0.70mm/px · z∈[-518,-292]mm · 12 of 133 slices shown, 15 images]
[im 10/133  mediastinal]
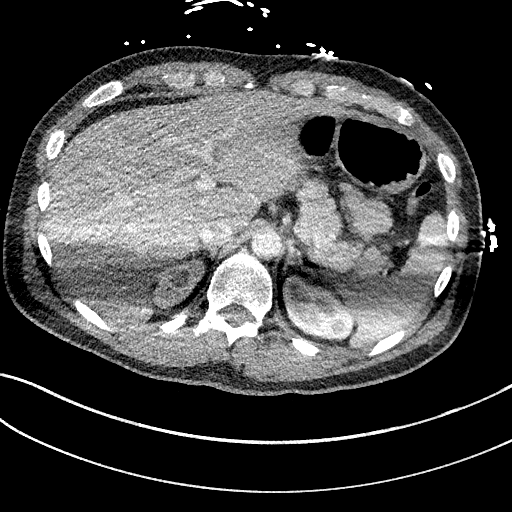
[im 10/133  lung]
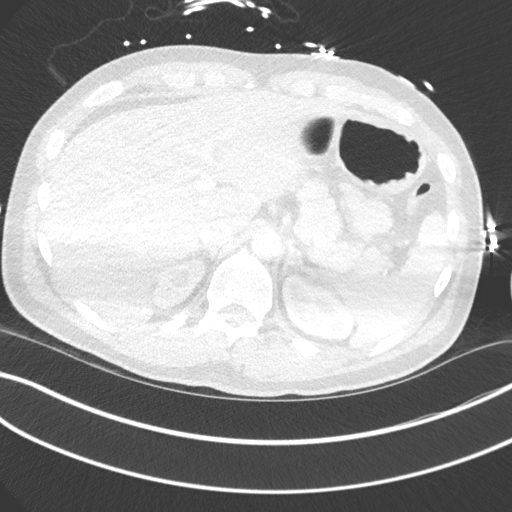
[im 20/133  lung]
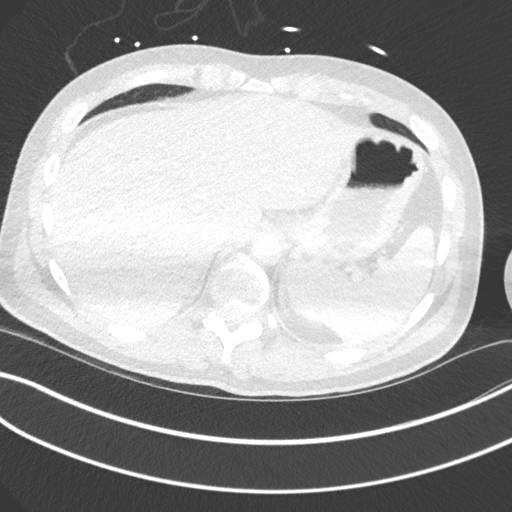
[im 30/133  lung]
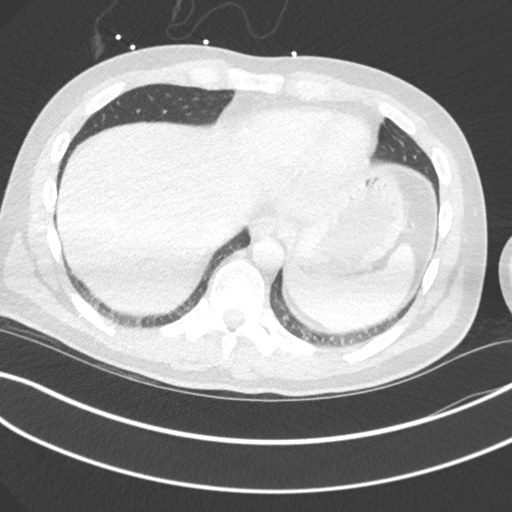
[im 40/133  lung]
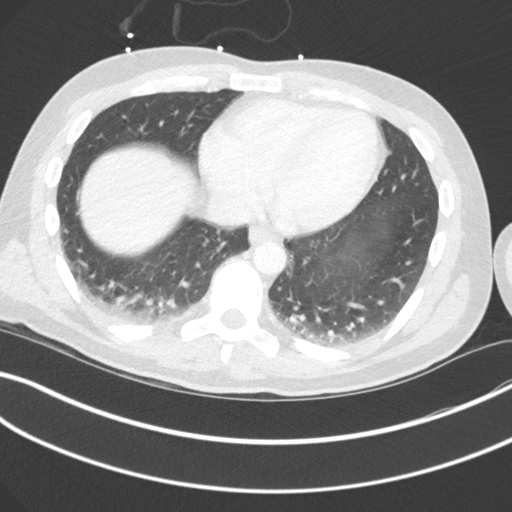
[im 49/133  mediastinal]
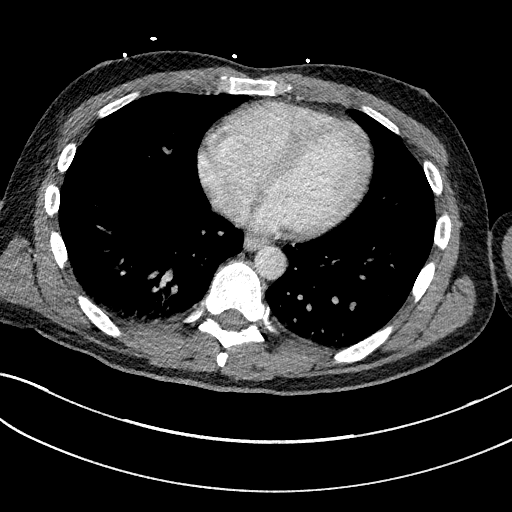
[im 49/133  lung]
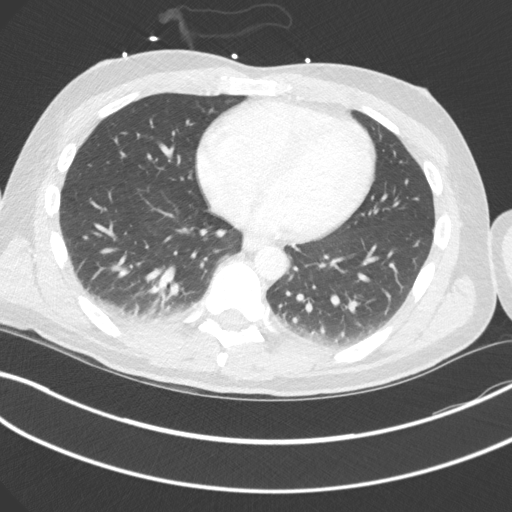
[im 59/133  lung]
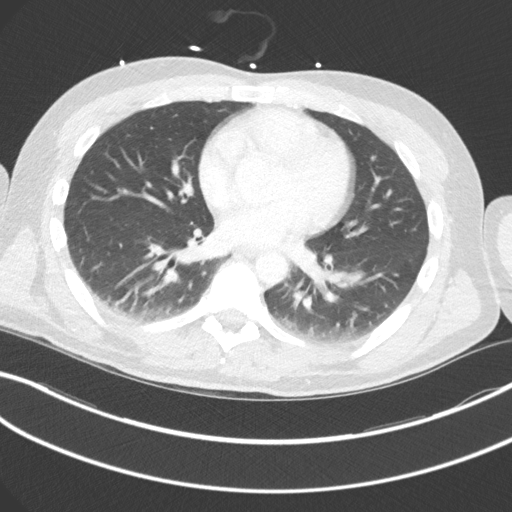
[im 74/133  lung]
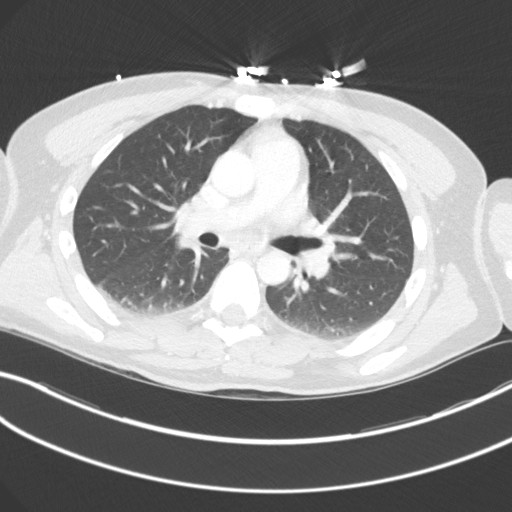
[im 84/133  lung]
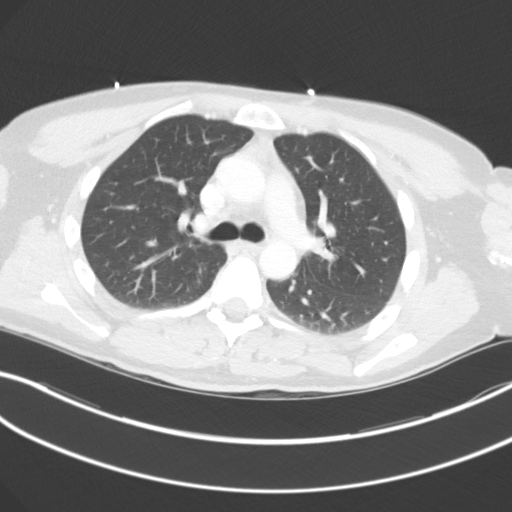
[im 93/133  mediastinal]
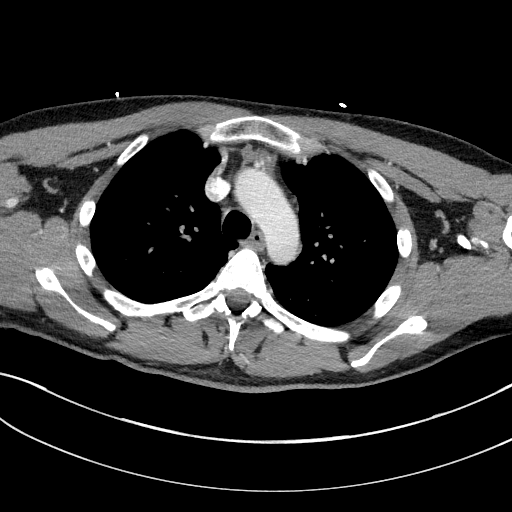
[im 93/133  lung]
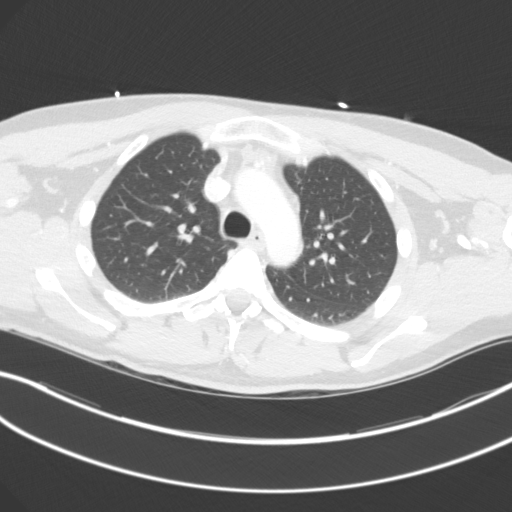
[im 103/133  lung]
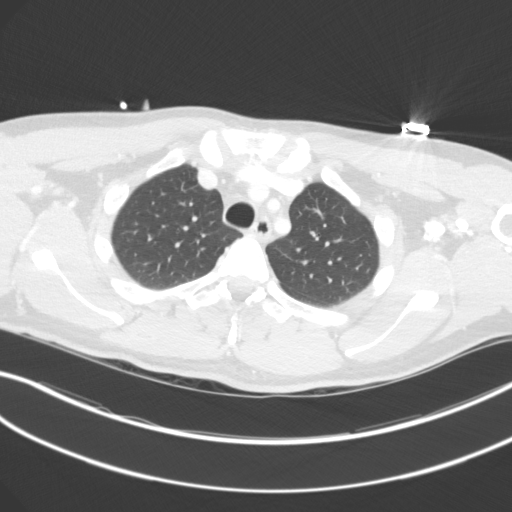
[im 113/133  lung]
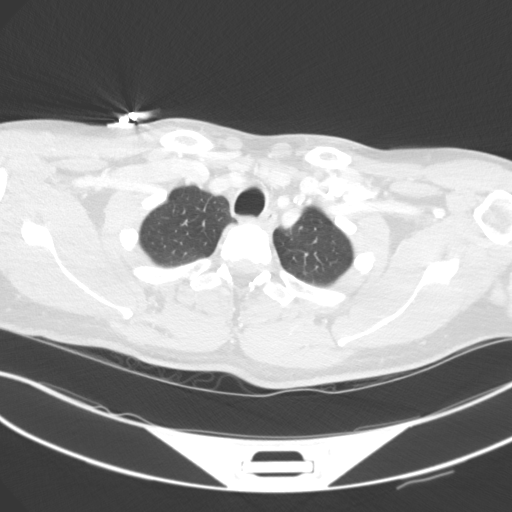
[im 123/133  lung]
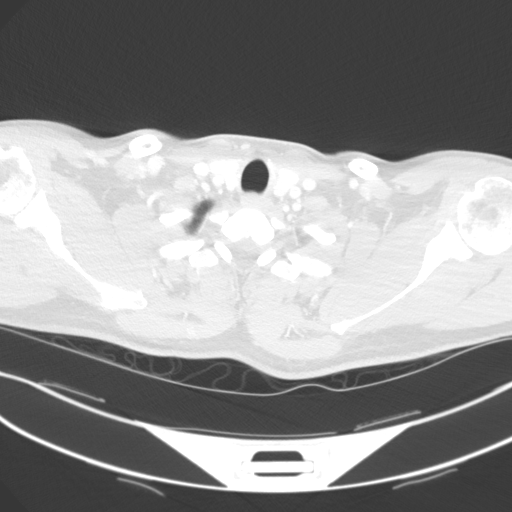

[Series 5: coronal · coronal · 0.57mm/px · 3 of 119 slices shown]
[im 24/119  lung]
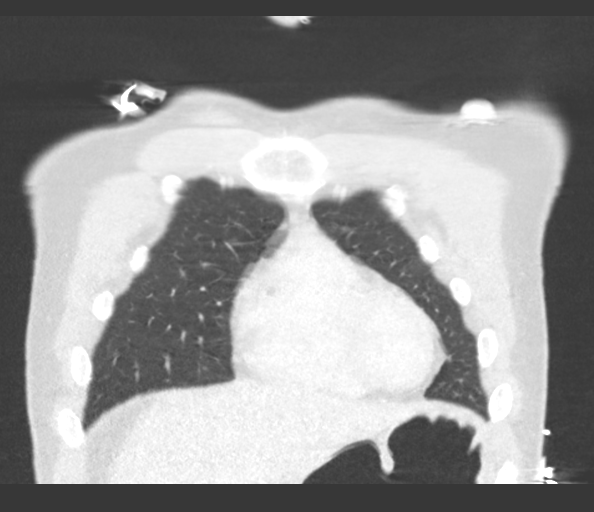
[im 48/119  lung]
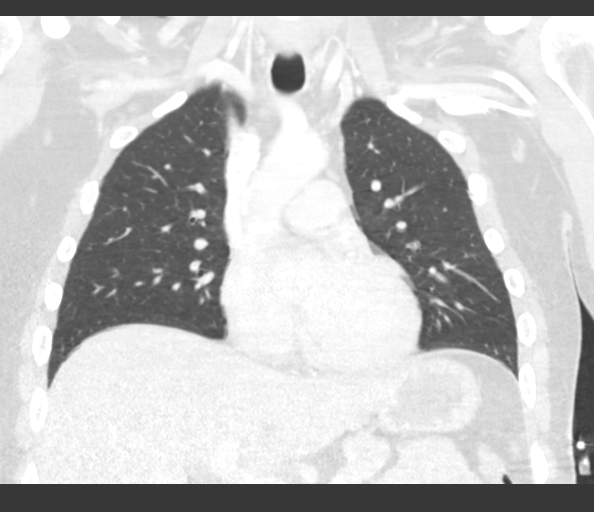
[im 71/119  lung]
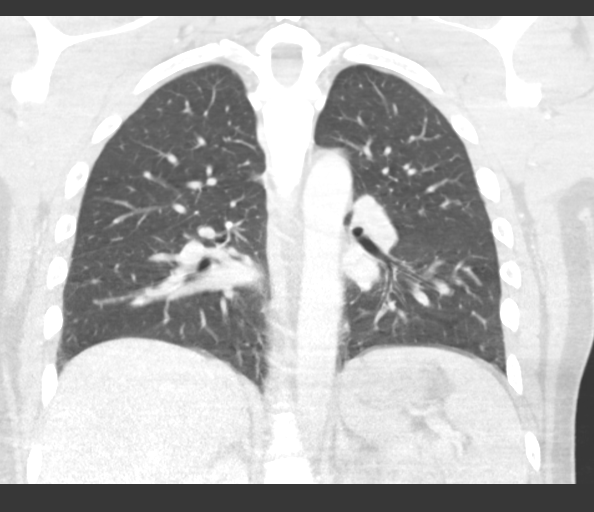

[15 of 36 positions shown; findings below may reference images not displayed]

FINDINGS: The patient was presumably unable to raise his arms for today' s
exam, introducing streak artifact which lowers diagnostic
sensitivity and specificity.

Cardiovascular: Unremarkable

Mediastinum/Nodes: Anterior mediastinal thymic tissue noted. No
pathologic adenopathy.

Lungs/Pleura: Unremarkable

Upper Abdomen: Unremarkable

Musculoskeletal: Unremarkable
IMPRESSION: 1.  No significant abnormality identified.

## 2017-06-13 MED ORDER — IOPAMIDOL (ISOVUE-300) INJECTION 61%
75.0000 mL | Freq: Once | INTRAVENOUS | Status: AC | PRN
  Administered 2017-06-13: 75 mL via INTRAVENOUS

## 2017-06-13 MED ORDER — LORAZEPAM 2 MG PO TABS
2.0000 mg | ORAL_TABLET | Freq: Once | ORAL | Status: DC
  Filled 2017-06-13: qty 1

## 2017-06-13 MED ORDER — LORAZEPAM 2 MG/ML IJ SOLN: 2.0000 mg | Freq: Once | INTRAMUSCULAR | Status: DC

## 2017-06-13 MED ORDER — LORAZEPAM 2 MG/ML IJ SOLN
INTRAMUSCULAR | Status: AC
  Filled 2017-06-13: qty 1

## 2017-06-13 MED ORDER — ASPIRIN 81 MG PO CHEW
324.0000 mg | CHEWABLE_TABLET | Freq: Once | ORAL | Status: DC
  Filled 2017-06-13: qty 4

## 2017-06-13 MED ORDER — LORAZEPAM 2 MG/ML IJ SOLN
1.0000 mg | Freq: Once | INTRAMUSCULAR | Status: AC
  Administered 2017-06-13: 1 mg via INTRAVENOUS

## 2017-06-13 MED ORDER — SODIUM CHLORIDE 0.9 % IV BOLUS (SEPSIS)
1000.0000 mL | Freq: Once | INTRAVENOUS | Status: AC
  Administered 2017-06-13: 1000 mL via INTRAVENOUS

## 2017-06-13 NOTE — ED Notes (Signed)
Called lab to inquire about status of add on troponin. States that it has not been run and placed another order

## 2017-06-13 NOTE — ED Notes (Addendum)
Pt stating that he wants help and to go to rehab facility. Pt making comments that we "are just going to send me home so I can use again". Pt advised that he should talk with psychiatrist to determine best plan of care for his wellbeing. Darci CurrentEd Tech, Jacob at bedside to assist with dress out.  Denies SI or HI.
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
## 2017-06-13 NOTE — ED Notes (Signed)
EDP at bedside to speak with patient. Pt now stating that he wants to go home.

## 2017-06-13 NOTE — ED Notes (Signed)
Informed EDP that pt would not take ordered PO medications. No further orders at this time. Pt states that he is cold, given warm blankets. Father at bedside. Pt offered again to take a sip of water, he will not attempt at this time. Updated on plan of care. Father indicates no further needs at this time. Father at bedside in attempt to help calm patient and walk him through calming breathing techniques.

## 2017-06-13 NOTE — ED Notes (Signed)
EDP at bedside. Pt cursing, complaining of chest pain. EDP applied oxygen for patient comfort. Verbal orders received.

## 2017-06-13 NOTE — ED Provider Notes (Addendum)
[facility] Emergency Department Provider Note   ____________________________________________   First MD Initiated Contact with Patient 06/13/17 [phone]     (approximate)  I have reviewed the triage vital signs and the nursing notes.   HISTORY  Chief Complaint Drug Problem and Chest Pain  History limited by agitation and lack of cooperation  HPI Danny Turner is a 26 y.o. male who says he's been snorting and smoking meth all might possibly for the last couple days. Complains of pain in his left temple complains of pain in his chest pain shooting down his neck and back at various times. When I see him he says he has pain in his left temple and pain in his chest which she cannot describe. He is very agitated and thrashing around in the bed wondering why his arms hurting when his blood pressure cuff goes off disoriented and confused. Patient given 2 mg of Ativan which promptly slows his respiratory rate heart rate from 130 to 107. Somewhat less agitated. Patient's mother and father trying to calm him down in the room. Patient had initially refused by mouth Ativan.   History reviewed. No pertinent past medical history.  There are no active problems to display for this patient.   Past Surgical History:  Procedure Laterality Date  . APPENDECTOMY    . LEG SURGERY Left     Prior to Admission medications   Not on File    Allergies Patient has no known allergies.  History reviewed. No pertinent family history.  Social History Social History  Substance Use Topics  . Smoking status: Current Every Day Smoker    Packs/day: 1.00    Types: Cigarettes  . Smokeless tobacco: Never Used  . Alcohol use Yes     Comment: daily use    Review of Systems Unable to obtain ____________________________________________   PHYSICAL EXAM:  VITAL SIGNS: ED Triage Vitals  Enc Vitals Group     BP 06/13/17 0825 (!) 132/103     Pulse Rate 06/13/17 0825 (!) 108   Resp 06/13/17 0825 16     Temp 06/13/17 0825 (!) 97.5 F (36.4 C)     Temp src --      SpO2 06/13/17 0825 100 %     Weight 06/13/17 0826 165 lb (74.8 kg)     Height 06/13/17 0826 5\' 7"  (1.702 m)     Head Circumference --      Peak Flow --      Pain Score 06/13/17 0837 8     Pain Loc --      Pain Edu? --      Excl. in GC? --     Constitutional: Alert  Agitated Eyes: Conjunctivae are normal. . Head: Atraumatic. Nose: No congestion/rhinnorhea. Mouth/Throat: Mucous membranes are moist.  Oropharynx non-erythematous. Neck: No stridor.  Cardiovascular: Rapid rate, regular rhythm. Grossly normal heart sounds.  Good peripheral circulation. Respiratory: Normal respiratory effort.  No retractions. Lungs CTAB. Gastrointestinal: Soft and nontender. No distention. No abdominal bruits. No CVA tenderness. Musculoskeletal: No lower extremity tenderness nor edema.  No joint effusions. Neurologic: Patient speaking clearly moving all extremities equally and well will not cooperate with exam Skin:  Skin is warm, dry and intact. No rash noted.   ____________________________________________   LABS (all labs ordered are listed, but only abnormal results are displayed)  Labs Reviewed  COMPREHENSIVE METABOLIC PANEL - Abnormal; Notable for the following:       Result Value   Glucose, Bld  102 (*)    Calcium 10.4 (*)    Total Protein 8.2 (*)    Total Bilirubin 3.0 (*)    All other components within normal limits  ETHANOL  CBC  TROPONIN I  CK  URINE DRUG SCREEN, QUALITATIVE ([facility] ONLY)   ____________________________________________  EKG  EKG read and interpreted by me shows sinus tachycardia rate of 119 left axis no acute ST-T wave changes  EKG #2 shows sinus tachycardia at 102 normal axis no acute ST-T wave changes ____________________________________________  RADIOLOGY  Ct Head Wo Contrast  Result Date: 06/13/2017 CLINICAL DATA:  26 year old male with chest pain following several day  methamphetamine binge EXAM: CT HEAD WITHOUT CONTRAST TECHNIQUE: Contiguous axial images were obtained from the base of the skull through the vertex without intravenous contrast. COMPARISON:  Prior CT scan the head 06/01/2010 FINDINGS: Brain: No evidence of acute infarction, hemorrhage, hydrocephalus, extra-axial collection or mass lesion/mass effect. Vascular: No hyperdense vessel or unexpected calcification. Skull: Normal. Negative for fracture or focal lesion. Sinuses/Orbits: No acute finding. Other: None. IMPRESSION: Negative head CT. Electronically Signed   By: Malachy Moan M.D.   On: 06/13/2017 10:55   Ct Chest W Contrast  Result Date: 06/13/2017 CLINICAL DATA:  [place] amphetamine use over the last few days. Headache, agitation, and chest pain. Back pain. EXAM: CT CHEST WITH CONTRAST TECHNIQUE: Multidetector CT imaging of the chest was performed during intravenous contrast administration. CONTRAST:  75mL ISOVUE-300 IOPAMIDOL (ISOVUE-300) INJECTION 61% COMPARISON:  Chest radiograph 06/13/2017 FINDINGS: The patient was presumably unable to raise his arms for today' s exam, introducing streak artifact which lowers diagnostic sensitivity and specificity. Cardiovascular: Unremarkable Mediastinum/Nodes: Anterior mediastinal thymic tissue noted. No pathologic adenopathy. Lungs/Pleura: Unremarkable Upper Abdomen: Unremarkable Musculoskeletal: Unremarkable IMPRESSION: 1.  No significant abnormality identified. Electronically Signed   By: Gaylyn Rong M.D.   On: 06/13/2017 10:55   Dg Chest Portable 1 View  Result Date: 06/13/2017 CLINICAL DATA:  Chest pain, was snorting and smoking meth all night EXAM: PORTABLE CHEST 1 VIEW COMPARISON:  Portable exam 0903 hours compared to 03/23/2016 FINDINGS: Normal heart size, mediastinal contours, and pulmonary vascularity. Lungs clear. No pleural effusion or pneumothorax. Bones unremarkable. IMPRESSION: No acute abnormalities. Electronically Signed   By: Ulyses Southward M.D.   On: 06/13/2017 09:18   CT of the head and chest are both normal ____________________________________________   PROCEDURES  Procedure(s) performed: Patient refused by mouth Ativan complaining that was giving him anything extremities do anything for him but still will not take any by mouth medicines give him 2 mg of IV Ativan. Patient calmed down some oral give him more Ativan if need be to get his CT of his head in his chest I'm unable to certain when he stopped using his meth how much meth use and if he is actually withdrawing or still intoxicated by the methamphetamine  Procedures  Critical Care performed:   ____________________________________________   INITIAL IMPRESSION / ASSESSMENT AND PLAN / ED COURSE  Pertinent labs & imaging results that were available during my care of the patient were reviewed by me and considered in my medical decision making (see chart for details).  Patient's parents tell me that he lost his child and the child's mother to drugs fairly recently. He was on methadone in [place] but could not did not want to get it when he came up here. He has been recently using [name] off the street. Has started meth also.      ____________________________________________  FINAL CLINICAL IMPRESSION(S) / ED DIAGNOSES  Final diagnoses:  Chest pain, unspecified type  Methamphetamine abuse      NEW MEDICATIONS STARTED DURING THIS VISIT:  New Prescriptions   No medications on file     Note:  This document was prepared using Dragon voice recognition software and may include unintentional dictation errors.    Arnaldo Natal, MD 06/13/17 1341 ----------------------------------------- 2:03 PM on 06/13/2017 -----------------------------------------  Patient is currently sleeping. His family reports he has slept for up to 2 days after coming off of methamphetamine in the past.   Arnaldo Natal, MD 06/13/17 1403 Patient is now awake and  does not want to stay refuses to stay he says he has been to rehabilitation multiple times knows everything there is no better he notes he just has to keep his nose in the ground and work on it. He will follow up with cardiology because he says he has chest pain occasionally even when he is sober. He is not having any now his 2 troponins are negative EKG looks okay and the CT scan of his head and chest were negative.   Arnaldo Natal, MD 06/13/17 1705  Patient confirms that he is not homicidal or suicidal at this time and has not been. He does say that the methamphetamine completely got rid of his [name] withdrawal.   Arnaldo Natal, MD 06/13/17 [phone]

## 2017-06-13 NOTE — ED Notes (Signed)
Per Dr. Darnelle CatalanMalinda, patient can receive another dose of IV ativan if he becomes anxious again.

## 2017-06-13 NOTE — Discharge Instructions (Signed)
I wish you would stay.  Please return here if you have any problems or questions or need any help with anything. We will do our best to help. As you said, make sure you continue to follow the straight and narrow path.  All of your heart tests were normal. However since you've had the chest pain and you said occasionally he has a one-year sober I would have you follow up with cardiologist. Dr. Darrold JunkerParaschos is on call his numbers above please give him a call and he should see you sometime this coming week. Again please return if you have any further problems or symptoms.

## 2017-06-13 NOTE — ED Notes (Signed)
Pt refusing to drink water, or take ordered pills.

## 2017-06-13 NOTE — ED Triage Notes (Addendum)
Pt arrived via EMS with c/o chest pain. Pt has been using meth for the past several days and also has not slept. Pt states he feels like he is having sharp pains down his back and neck.  Pt states he has been having chest pain in the middle of his chest.  Pt keeps saying that he feels like he is going to die because he is having a heart attack. Pt reassured that he did an EKG.

## 2017-06-13 NOTE — ED Notes (Addendum)
Inquiring about patient symptoms and what happened PTA, pt states that he has already told multiple people and patient refuses to tell this nurse. Pt asked about pain, states in chest but will not rate. Pt appears anxious, asking to speak with his sister and mother. PT states he is having a heart attack, "he knows it". Father at bedside of patient. Pt does report using meth, but will not elaborate further.  Pt will not attempt to drink water, pt will not attempt to sit up on his own, pt will not attempt to chew ordered aspirin.

## 2017-06-13 NOTE — ED Notes (Signed)
First Nurse Note: Pt brought in by [facility], pt is having chest pain. Pt admits to snorting and smoking meth all night. Pt states that he is having facial numbness.

## 2017-06-13 NOTE — ED Notes (Signed)
Pt awake and asking for meal tray. Pt given sandwich and drink. States he will attempt to urinate after he eats. Family at bedside.

## 2017-06-13 NOTE — ED Notes (Signed)
Pt states he has not used IV drugs in 8 years

## 2017-06-13 NOTE — ED Notes (Signed)
TTS at bedside. 

## 2017-06-13 NOTE — ED Notes (Signed)
Pt standing at door of room asking for discharge papers. Pt given papers by Darl PikesSusan, RN. Pt refused to sign.

## 2017-06-13 NOTE — ED Notes (Signed)
Pt assisted to stretcher by [employer] officer, first nurse Morrie SheldonAshley and this RN. Pt does not offer much assistance.

## 2017-06-13 NOTE — ED Notes (Addendum)
Pt mother back in room with patient. Pt continues to state that he is having a heart attack. Morrie SheldonAshley, RN at bedside to reassure patient. From nurses desk, "you f*ing b*tch" can be heard from patient.   Family apologetic

## 2020-02-04 ENCOUNTER — Emergency Department: Payer: Self-pay

## 2020-02-04 ENCOUNTER — Encounter: Payer: Self-pay | Admitting: Emergency Medicine

## 2020-02-04 ENCOUNTER — Other Ambulatory Visit: Payer: Self-pay

## 2020-02-04 ENCOUNTER — Emergency Department
Admission: EM | Admit: 2020-02-04 | Discharge: 2020-02-04 | Disposition: A | Discharge status: Home / Self Care | Payer: Self-pay | Attending: Emergency Medicine | Admitting: Emergency Medicine

## 2020-02-04 DIAGNOSIS — R202 Paresthesia of skin: Secondary | ICD-10-CM | POA: Insufficient documentation

## 2020-02-04 DIAGNOSIS — Z79899 Other long term (current) drug therapy: Secondary | ICD-10-CM | POA: Insufficient documentation

## 2020-02-04 DIAGNOSIS — R945 Abnormal results of liver function studies: Secondary | ICD-10-CM | POA: Insufficient documentation

## 2020-02-04 DIAGNOSIS — F17210 Nicotine dependence, cigarettes, uncomplicated: Secondary | ICD-10-CM | POA: Insufficient documentation

## 2020-02-04 DIAGNOSIS — R7989 Other specified abnormal findings of blood chemistry: Secondary | ICD-10-CM

## 2020-02-04 DIAGNOSIS — R200 Anesthesia of skin: Secondary | ICD-10-CM | POA: Insufficient documentation

## 2020-02-04 LAB — DIFFERENTIAL
Abs Immature Granulocytes: 0.07 10*3/uL (ref 0.00–0.07)
Basophils Absolute: 0 10*3/uL (ref 0.0–0.1)
Basophils Relative: 0 %
Eosinophils Absolute: 0.1 10*3/uL (ref 0.0–0.5)
Eosinophils Relative: 1 %
Immature Granulocytes: 1 %
Lymphocytes Relative: 17 %
Lymphs Abs: 2 10*3/uL (ref 0.7–4.0)
Monocytes Absolute: 0.7 10*3/uL (ref 0.1–1.0)
Monocytes Relative: 7 %
Neutro Abs: 8.4 10*3/uL — ABNORMAL HIGH (ref 1.7–7.7)
Neutrophils Relative %: 74 %

## 2020-02-04 LAB — HEPATITIS PANEL, ACUTE
HCV Ab: REACTIVE — AB
Hep A IgM: NONREACTIVE
Hep B C IgM: NONREACTIVE
Hepatitis B Surface Ag: NONREACTIVE

## 2020-02-04 LAB — URINE DRUG SCREEN, QUALITATIVE (ARMC ONLY)
Amphetamines, Ur Screen: POSITIVE — AB
Barbiturates, Ur Screen: NOT DETECTED
Benzodiazepine, Ur Scrn: NOT DETECTED
Cannabinoid 50 Ng, Ur ~~LOC~~: NOT DETECTED
Cocaine Metabolite,Ur ~~LOC~~: NOT DETECTED
MDMA (Ecstasy)Ur Screen: NOT DETECTED
Methadone Scn, Ur: NOT DETECTED
Opiate, Ur Screen: NOT DETECTED
Phencyclidine (PCP) Ur S: NOT DETECTED
Tricyclic, Ur Screen: NOT DETECTED

## 2020-02-04 LAB — COMPREHENSIVE METABOLIC PANEL
ALT: 266 U/L — ABNORMAL HIGH (ref 0–44)
AST: 87 U/L — ABNORMAL HIGH (ref 15–41)
Albumin: 4.3 g/dL (ref 3.5–5.0)
Alkaline Phosphatase: 41 U/L (ref 38–126)
Anion gap: 9 (ref 5–15)
BUN: 21 mg/dL — ABNORMAL HIGH (ref 6–20)
CO2: 26 mmol/L (ref 22–32)
Calcium: 9.1 mg/dL (ref 8.9–10.3)
Chloride: 103 mmol/L (ref 98–111)
Creatinine, Ser: 0.88 mg/dL (ref 0.61–1.24)
GFR calc Af Amer: >60 mL/min (ref >60)
GFR calc non Af Amer: >60 mL/min (ref >60)
Glucose, Bld: 98 mg/dL (ref 70–99)
Potassium: 3.6 mmol/L (ref 3.5–5.1)
Sodium: 138 mmol/L (ref 135–145)
Total Bilirubin: 1.9 mg/dL — ABNORMAL HIGH (ref 0.3–1.2)
Total Protein: 7.5 g/dL (ref 6.5–8.1)

## 2020-02-04 LAB — PROTIME-INR
INR: 1 (ref 0.8–1.2)
Prothrombin Time: 13.3 seconds (ref 11.4–15.2)

## 2020-02-04 LAB — LIPASE, BLOOD: Lipase: 25 U/L (ref 11–51)

## 2020-02-04 LAB — CBC
HCT: 49.3 % (ref 39.0–52.0)
Hemoglobin: 17.1 g/dL — ABNORMAL HIGH (ref 13.0–17.0)
MCH: 28.8 pg (ref 26.0–34.0)
MCHC: 34.7 g/dL (ref 30.0–36.0)
MCV: 83 fL (ref 80.0–100.0)
Platelets: 333 10*3/uL (ref 150–400)
RBC: 5.94 MIL/uL — ABNORMAL HIGH (ref 4.22–5.81)
RDW: 12.7 % (ref 11.5–15.5)
WBC: 11.3 10*3/uL — ABNORMAL HIGH (ref 4.0–10.5)
nRBC: 0 % (ref 0.0–0.2)

## 2020-02-04 LAB — TROPONIN I (HIGH SENSITIVITY)
Troponin I (High Sensitivity): <2 ng/L (ref <18)
Troponin I (High Sensitivity): <2 ng/L (ref <18)

## 2020-02-04 LAB — ETHANOL: Alcohol, Ethyl (B): <10 mg/dL (ref <10)

## 2020-02-04 LAB — ACETAMINOPHEN LEVEL: Acetaminophen (Tylenol), Serum: <10 ug/mL — ABNORMAL LOW (ref 10–30)

## 2020-02-04 LAB — GLUCOSE, CAPILLARY: Glucose-Capillary: 89 mg/dL (ref 70–99)

## 2020-02-04 LAB — APTT: aPTT: 32 seconds (ref 24–36)

## 2020-02-04 IMAGING — CT CT HEAD CODE STROKE
3 series · 16 of 47 positions shown, 19 images · non-contrast
Comparison: [DATE]

CLINICAL DATA: Code stroke. Right-sided numbness that started 3
hours ago

EXAM:
CT HEAD WITHOUT CONTRAST
TECHNIQUE: Contiguous axial images were obtained from the base of the skull
through the vertex without intravenous contrast.

[Series 3: head wo · axial · 0.40mm/px · z∈[-124,+1]mm · 10 of 31 slices shown, 13 images]
[im 3/31  brain]
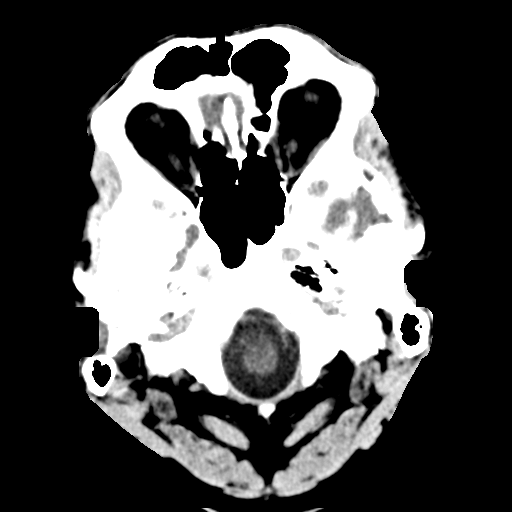
[im 3/31  bone]
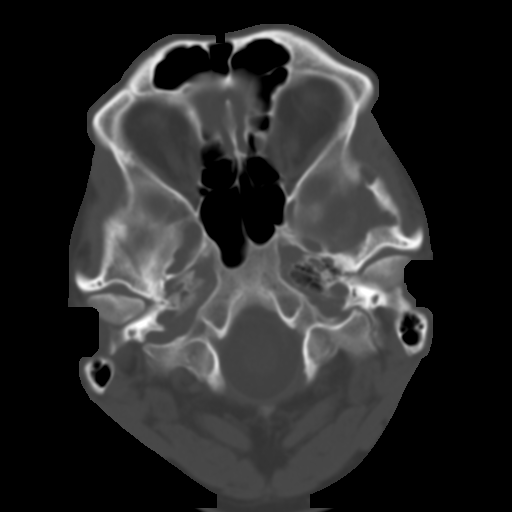
[im 6/31  brain]
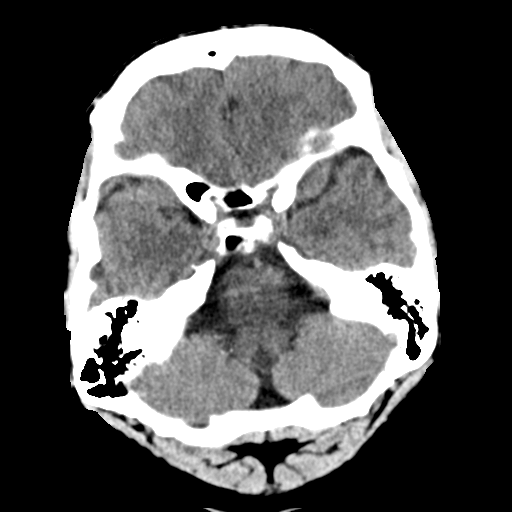
[im 9/31  brain]
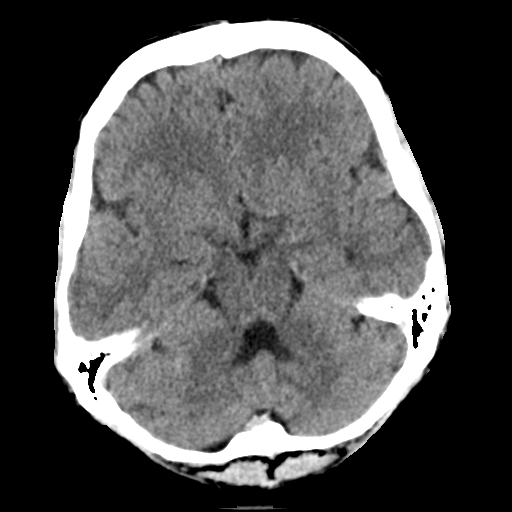
[im 11/31  brain]
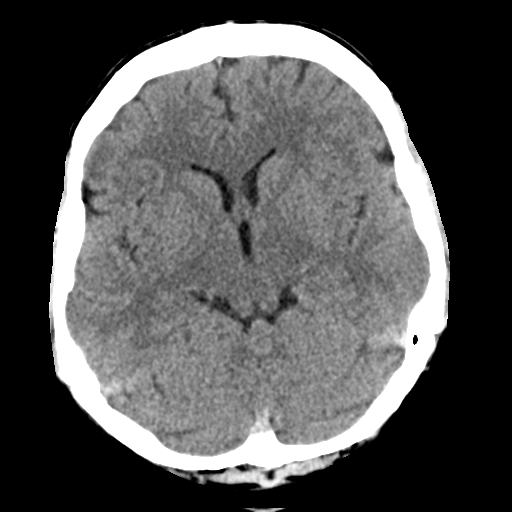
[im 14/31  brain]
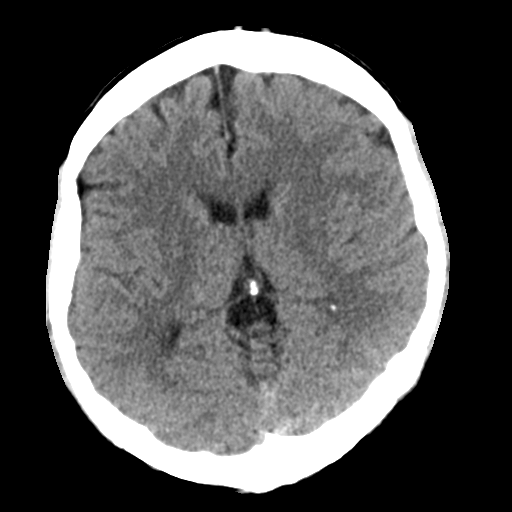
[im 14/31  bone]
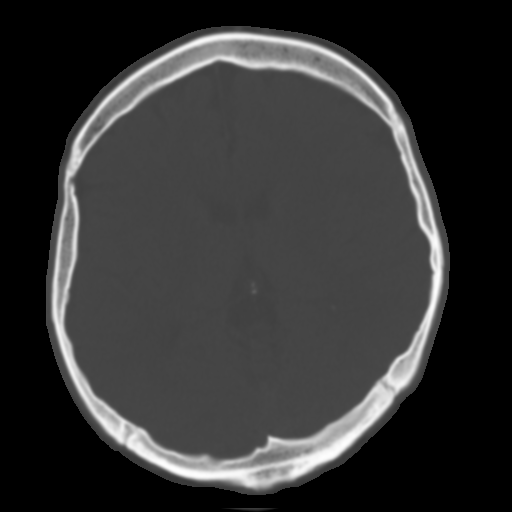
[im 17/31  brain]
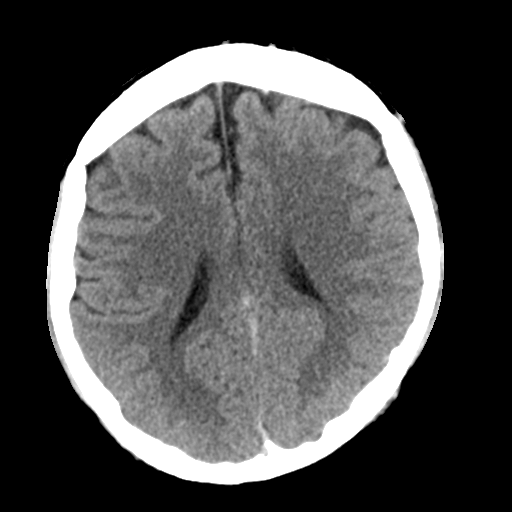
[im 20/31  brain]
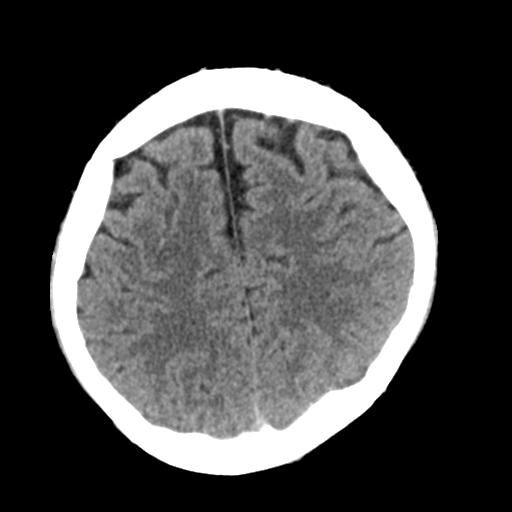
[im 23/31  brain]
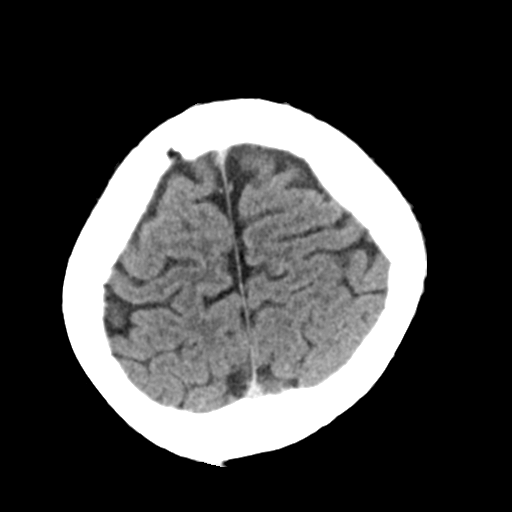
[im 25/31  brain]
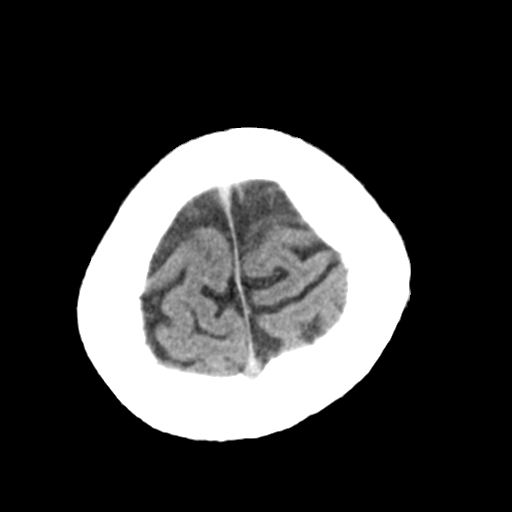
[im 25/31  bone]
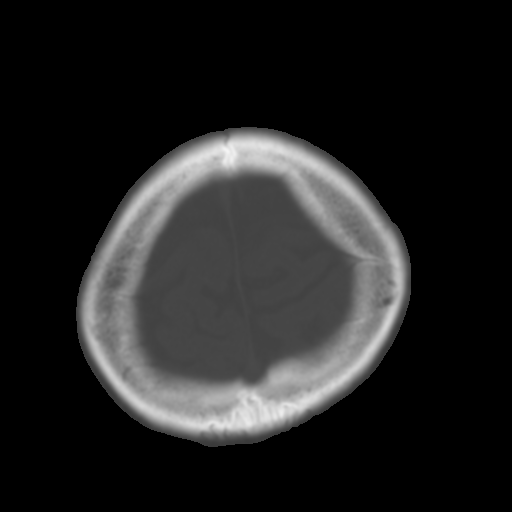
[im 28/31  brain]
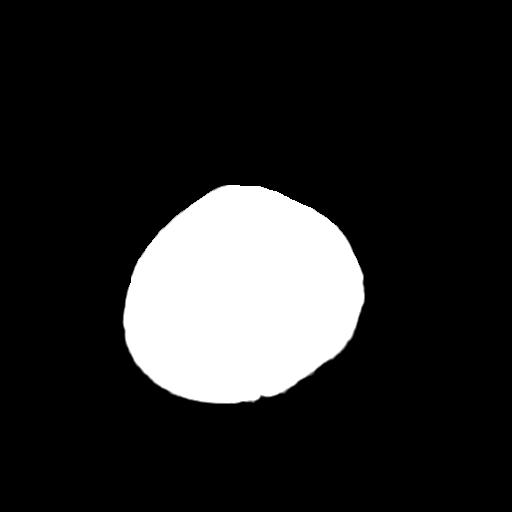

[Series 4: coronal soft tissue · coronal · 0.34mm/px · 3 of 66 slices shown]
[im 22/66  brain]
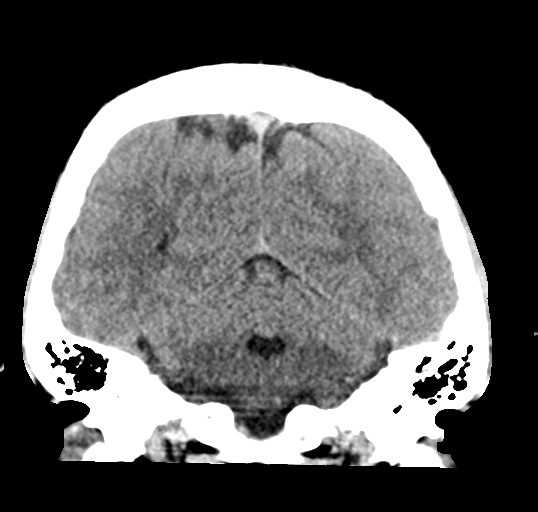
[im 29/66  brain]
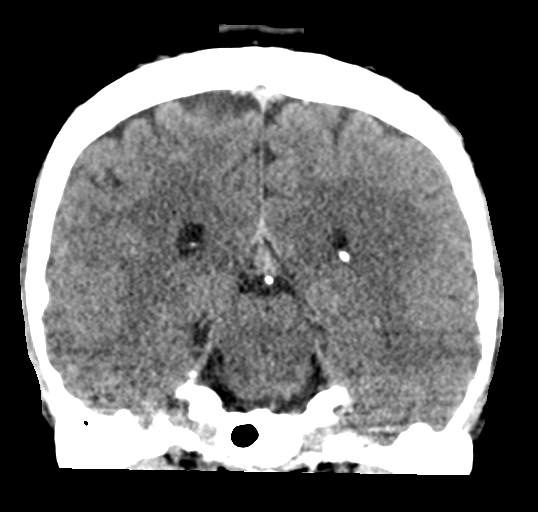
[im 37/66  brain]
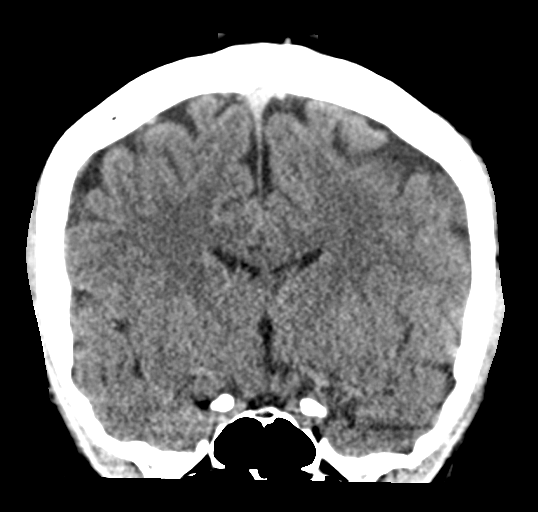

[Series 5: sagittal soft tissue · sagittal · 0.34mm/px · 3 of 62 slices shown]
[im 21/62  brain]
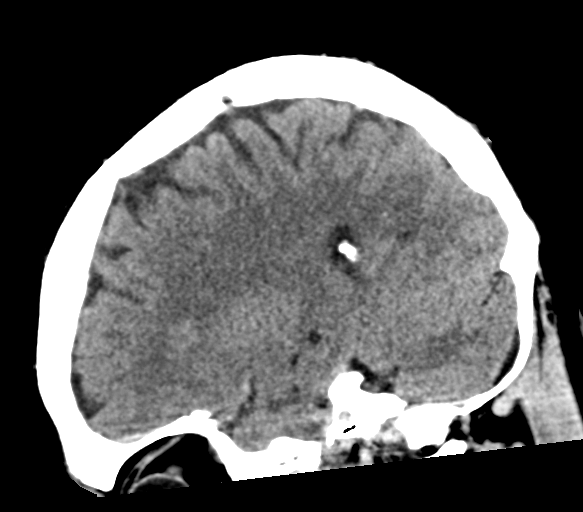
[im 31/62  brain]
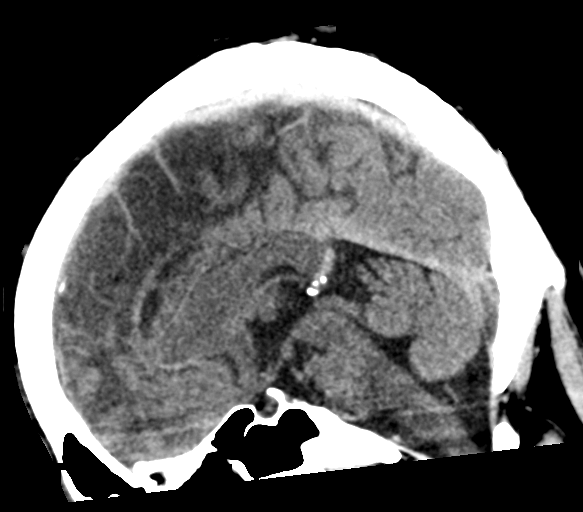
[im 41/62  brain]
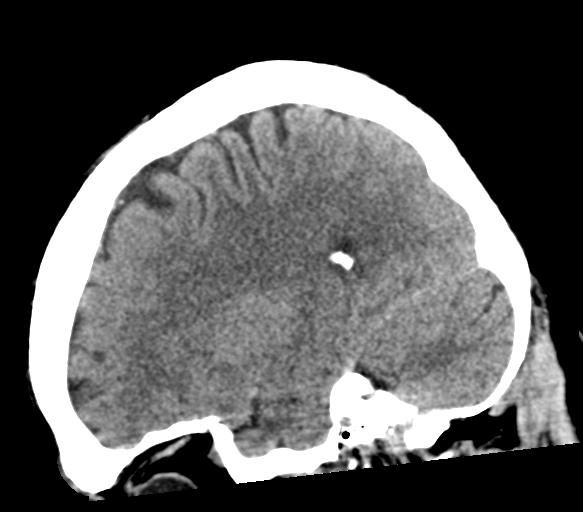

[16 of 47 positions shown; findings below may reference images not displayed]

FINDINGS: Brain: No evidence of acute infarction, hemorrhage, hydrocephalus,
extra-axial collection or mass lesion/mass effect.

Vascular: No hyperdense vessel or unexpected calcification.

Skull: Posterior scalp scarring.  No fracture.

Sinuses/Orbits: No acute finding.

Other: These results were called by telephone at the time of
interpretation on [DATE] at [DATE] to provider PAYRAW , who
verbally acknowledged these results.

ASPECTS (Alberta Stroke Program Early CT Score)

- Ganglionic level infarction (caudate, lentiform nuclei, internal
capsule, insula, M1-M3 cortex): 7

- Supraganglionic infarction (M4-M6 cortex): 3

Total score (0-10 with 10 being normal): 10
IMPRESSION: 1. Negative head CT.
2. ASPECTS is 10.

## 2020-02-04 IMAGING — US US ABDOMEN LIMITED
1 series · 14 of 25 positions shown · non-contrast
Comparison: [DATE] CT abdomen/pelvis. [DATE] abdominal
sonogram.

CLINICAL DATA: Elevated liver function tests.

EXAM:
ULTRASOUND ABDOMEN LIMITED RIGHT UPPER QUADRANT

[Series 1: us abdomen limited ruq · 14 of 53 slices shown]
[im 1/53]
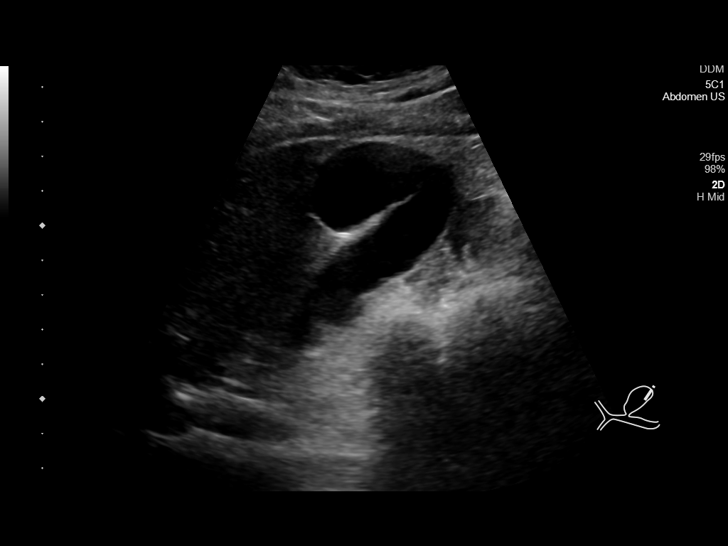
[im 5/53]
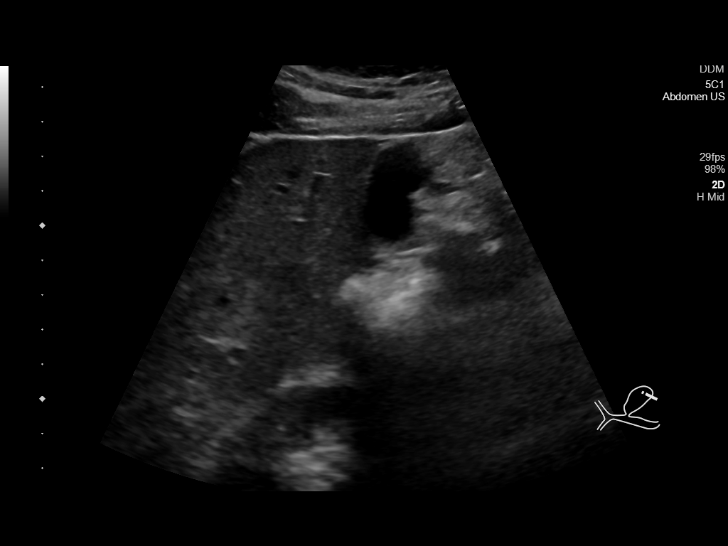
[im 9/53]
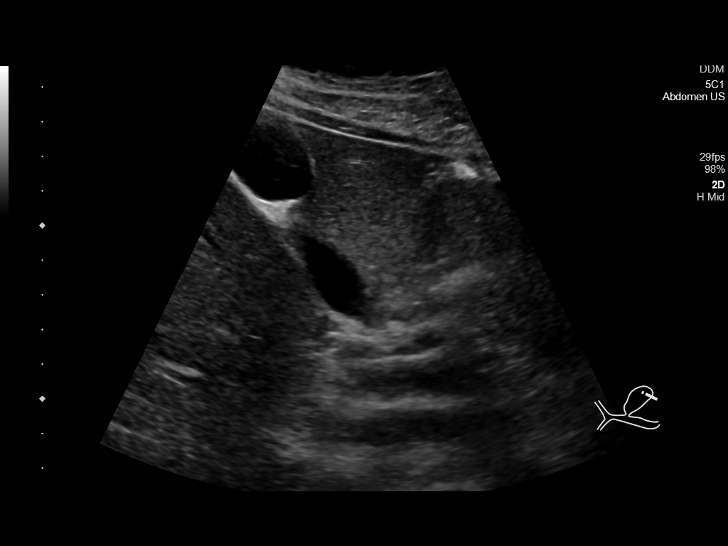
[im 14/53]
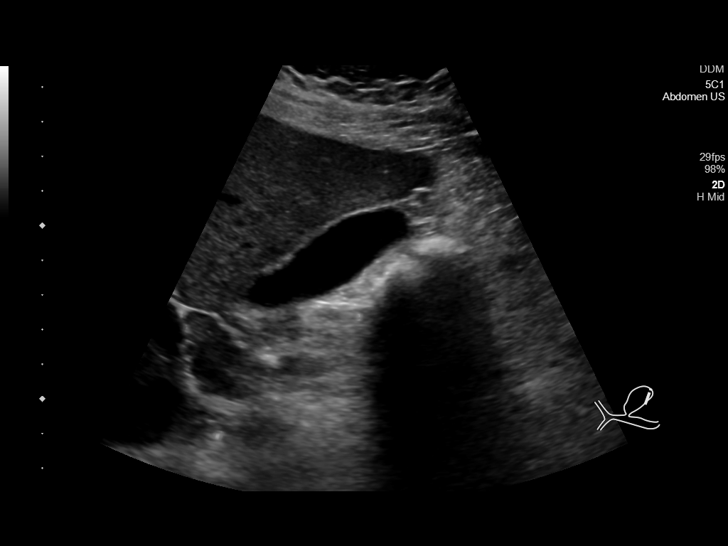
[im 18/53]
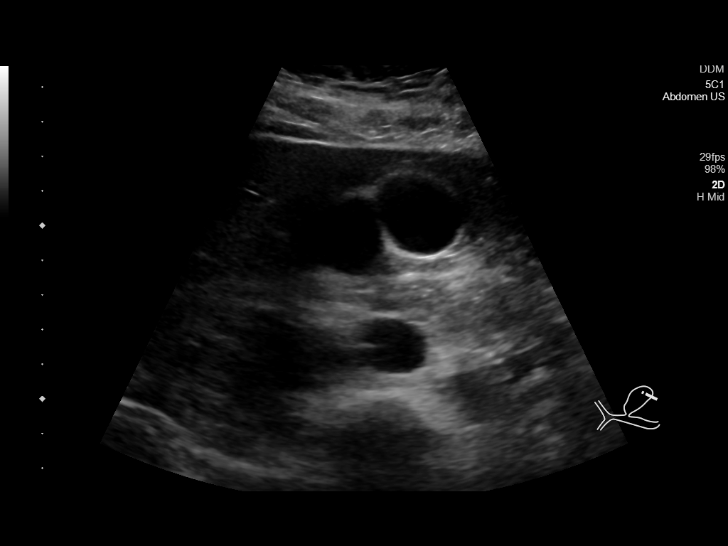
[im 20/53]
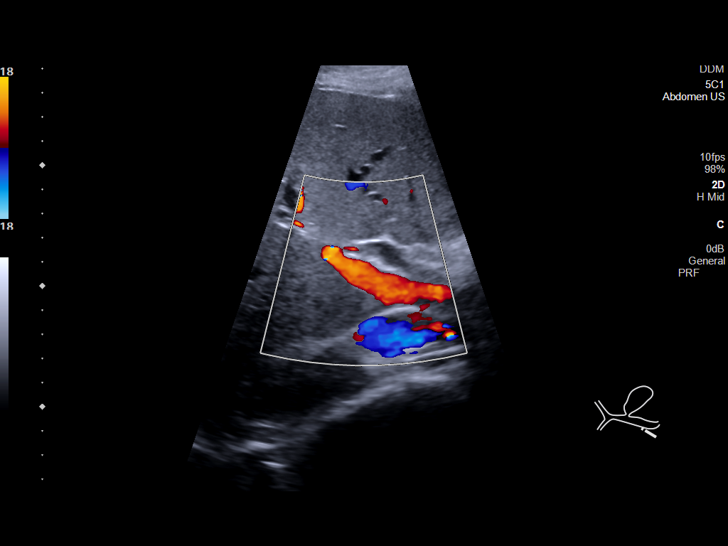
[im 24/53]
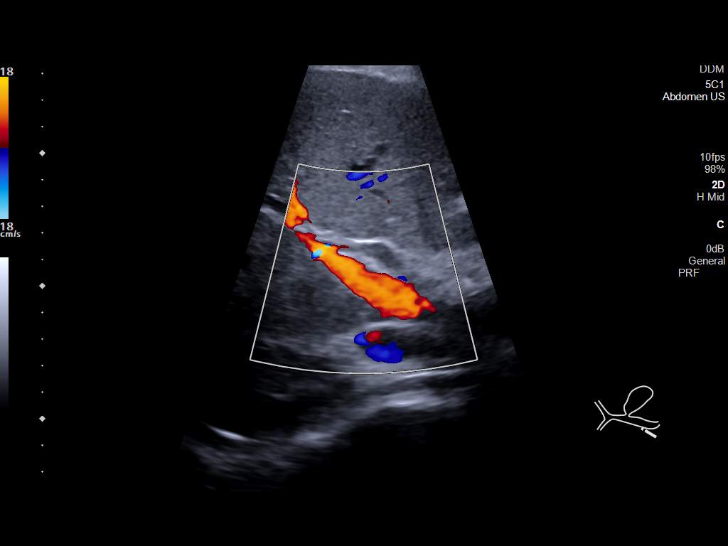
[im 29/53]
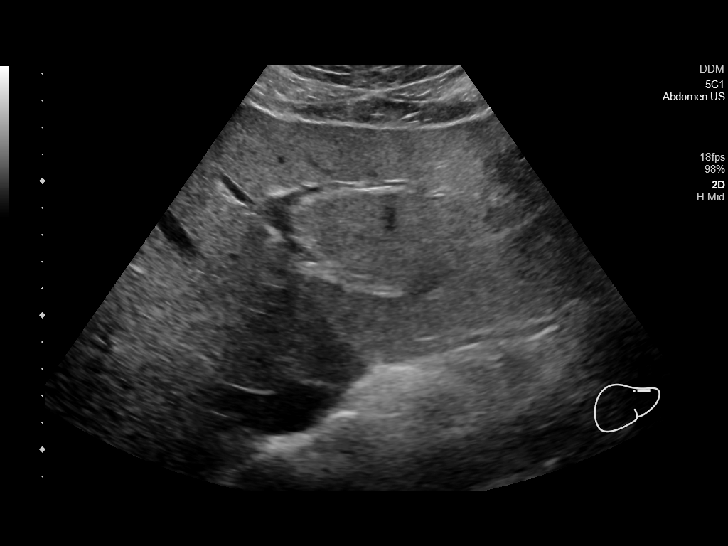
[im 33/53]
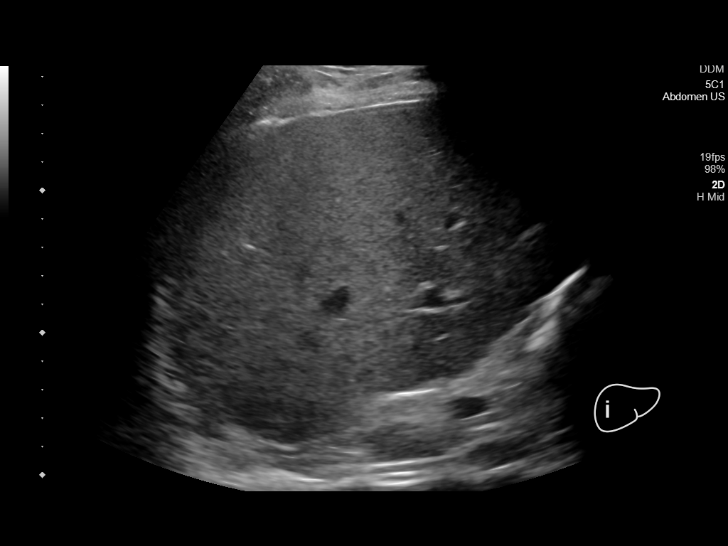
[im 35/53]
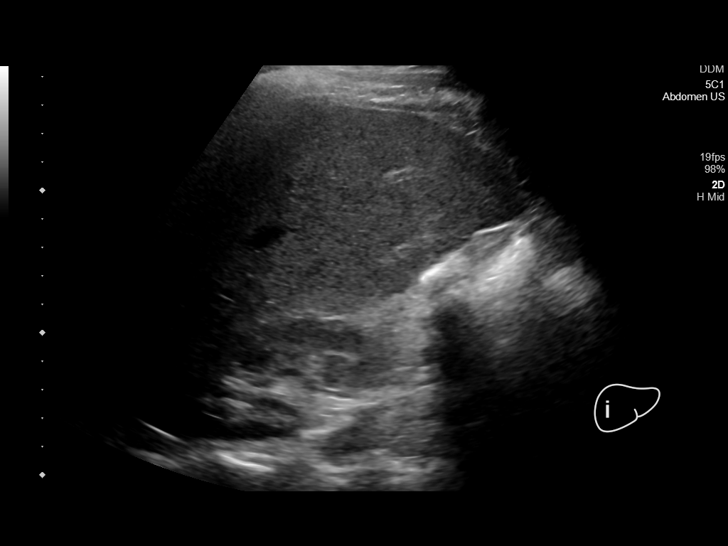
[im 40/53]
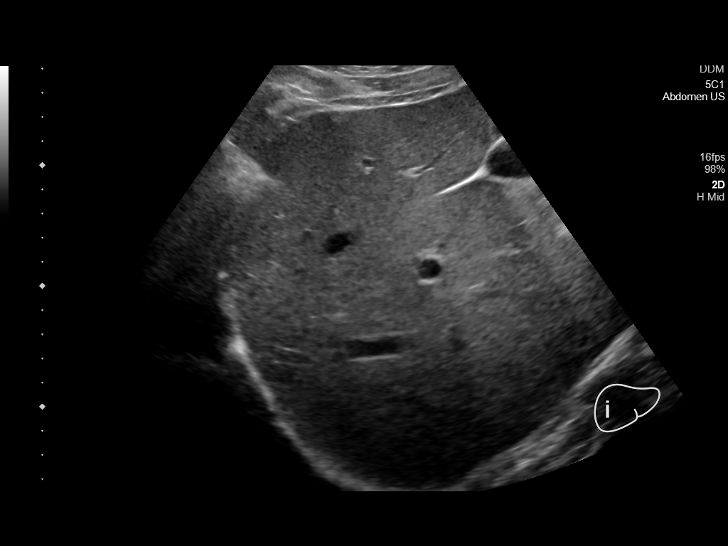
[im 44/53]
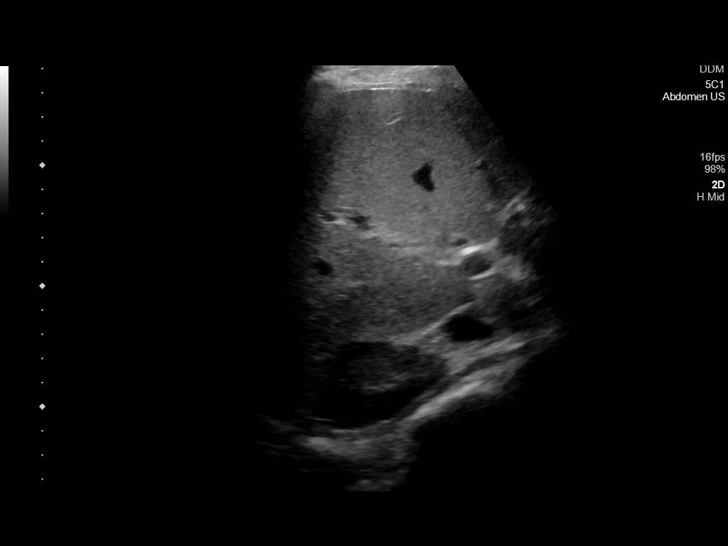
[im 48/53]
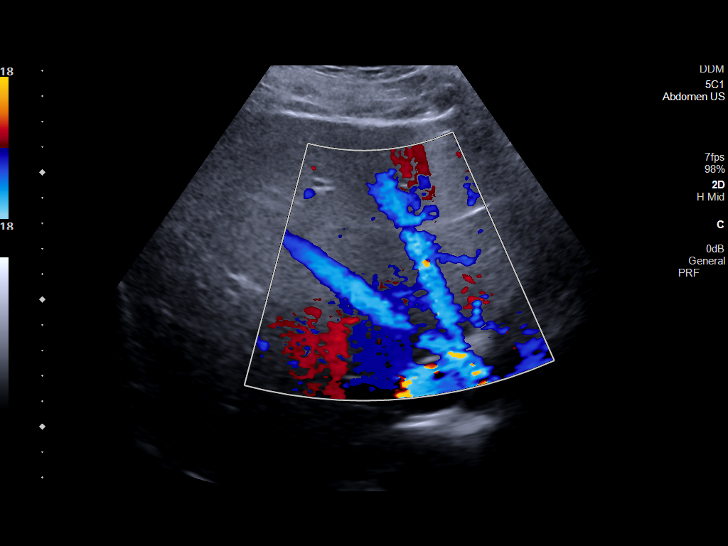
[im 53/53]
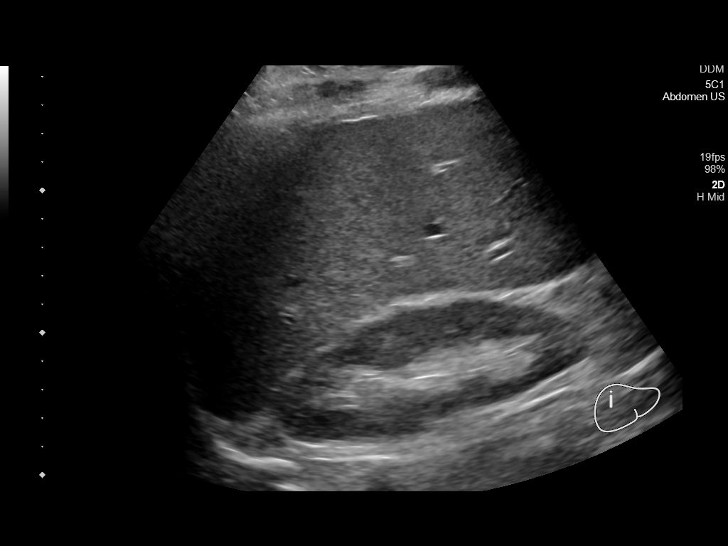

[14 of 25 positions shown; findings below may reference images not displayed]

FINDINGS: Gallbladder:

No gallstones or wall thickening visualized. No sonographic Murphy
sign noted by sonographer.

Common bile duct:

Diameter: 5 mm

Liver:

No focal lesion identified. Within normal limits in parenchymal
echogenicity. Portal vein is patent on color Doppler imaging with
normal direction of blood flow towards the liver.

Other: None.
IMPRESSION: Normal right upper quadrant abdominal sonogram, with no
cholelithiasis.

## 2020-02-04 IMAGING — CR DG CHEST 2V
1 series · 2 of 2 positions shown · non-contrast
Comparison: [DATE]

CLINICAL DATA: Right-sided numbness beginning 2 hours ago.

EXAM:
CHEST - 2 VIEW

[Series 1: dg chest 2 view · 0.14mm/px · 2 of 2 slices shown]
[im 1/2]
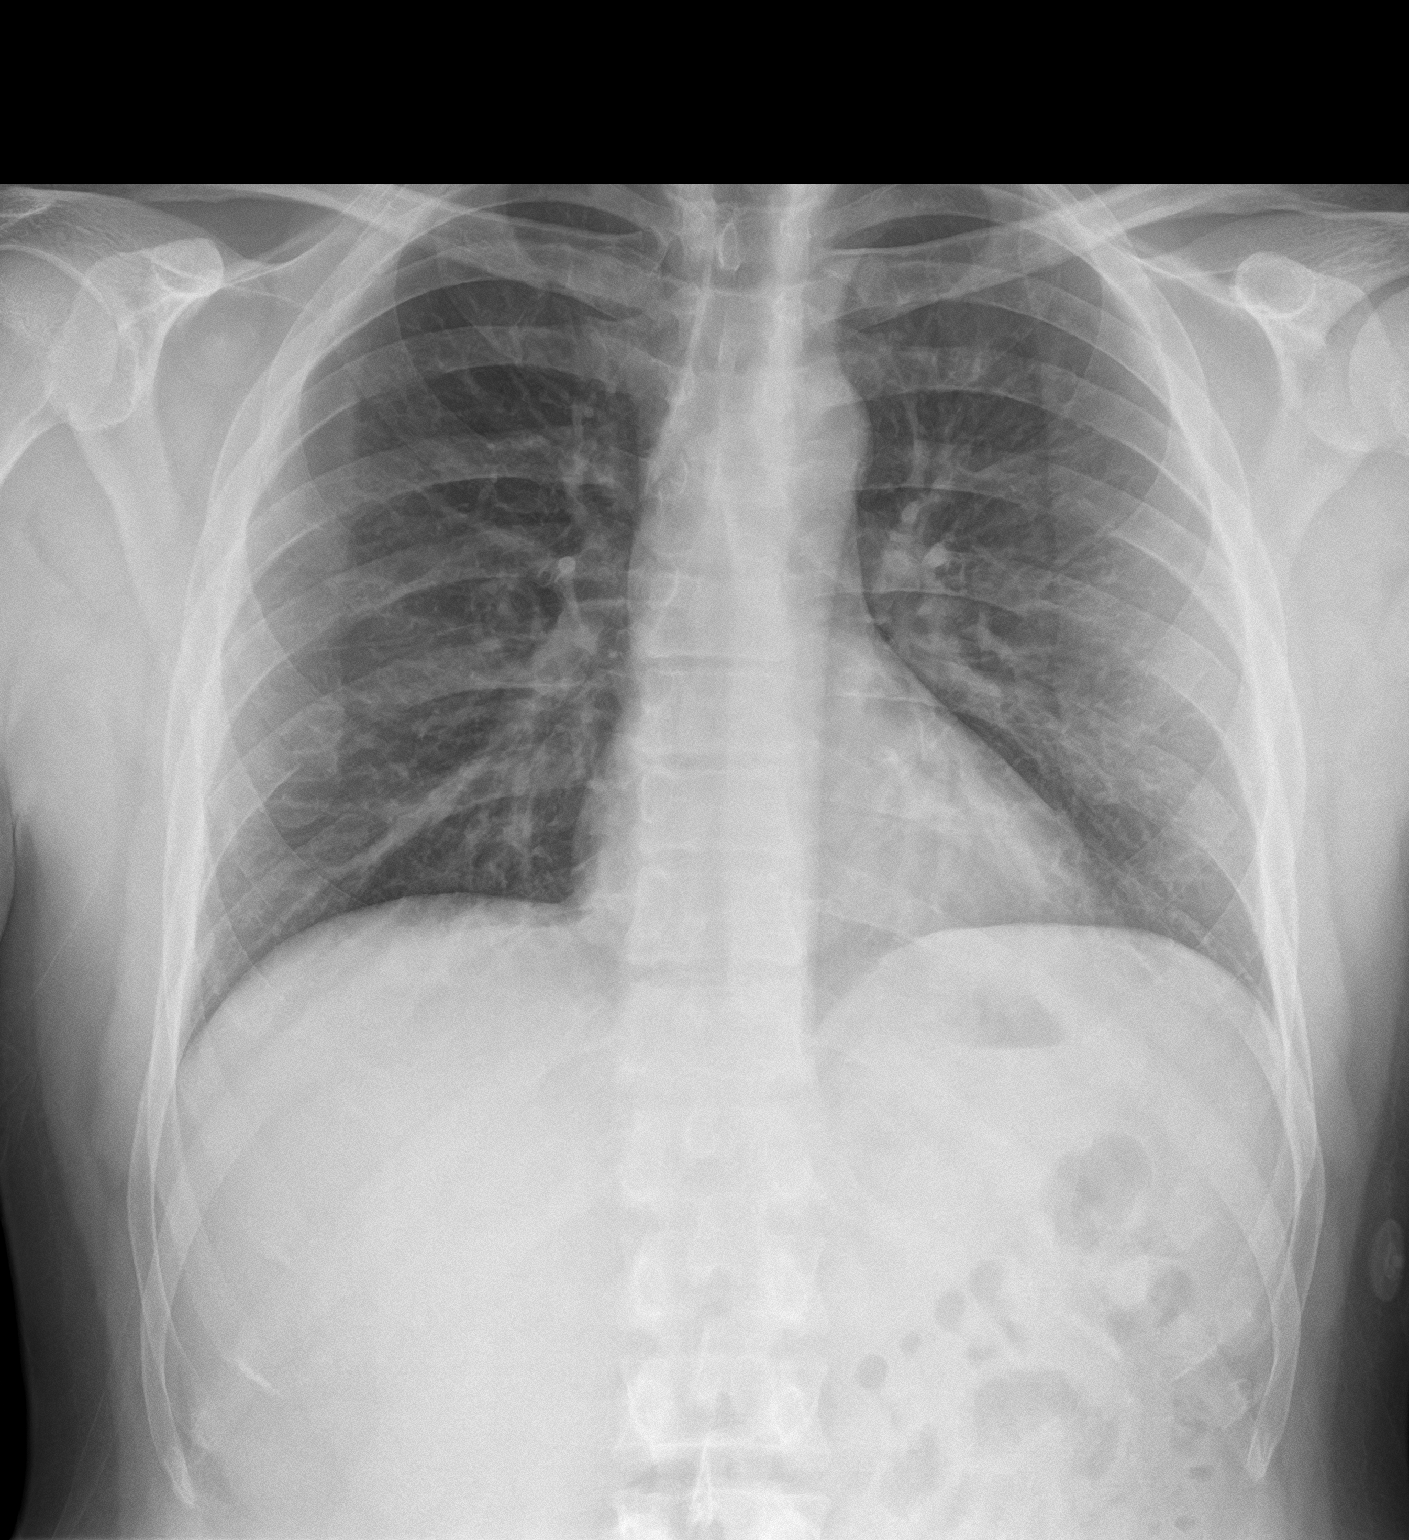
[im 2/2]
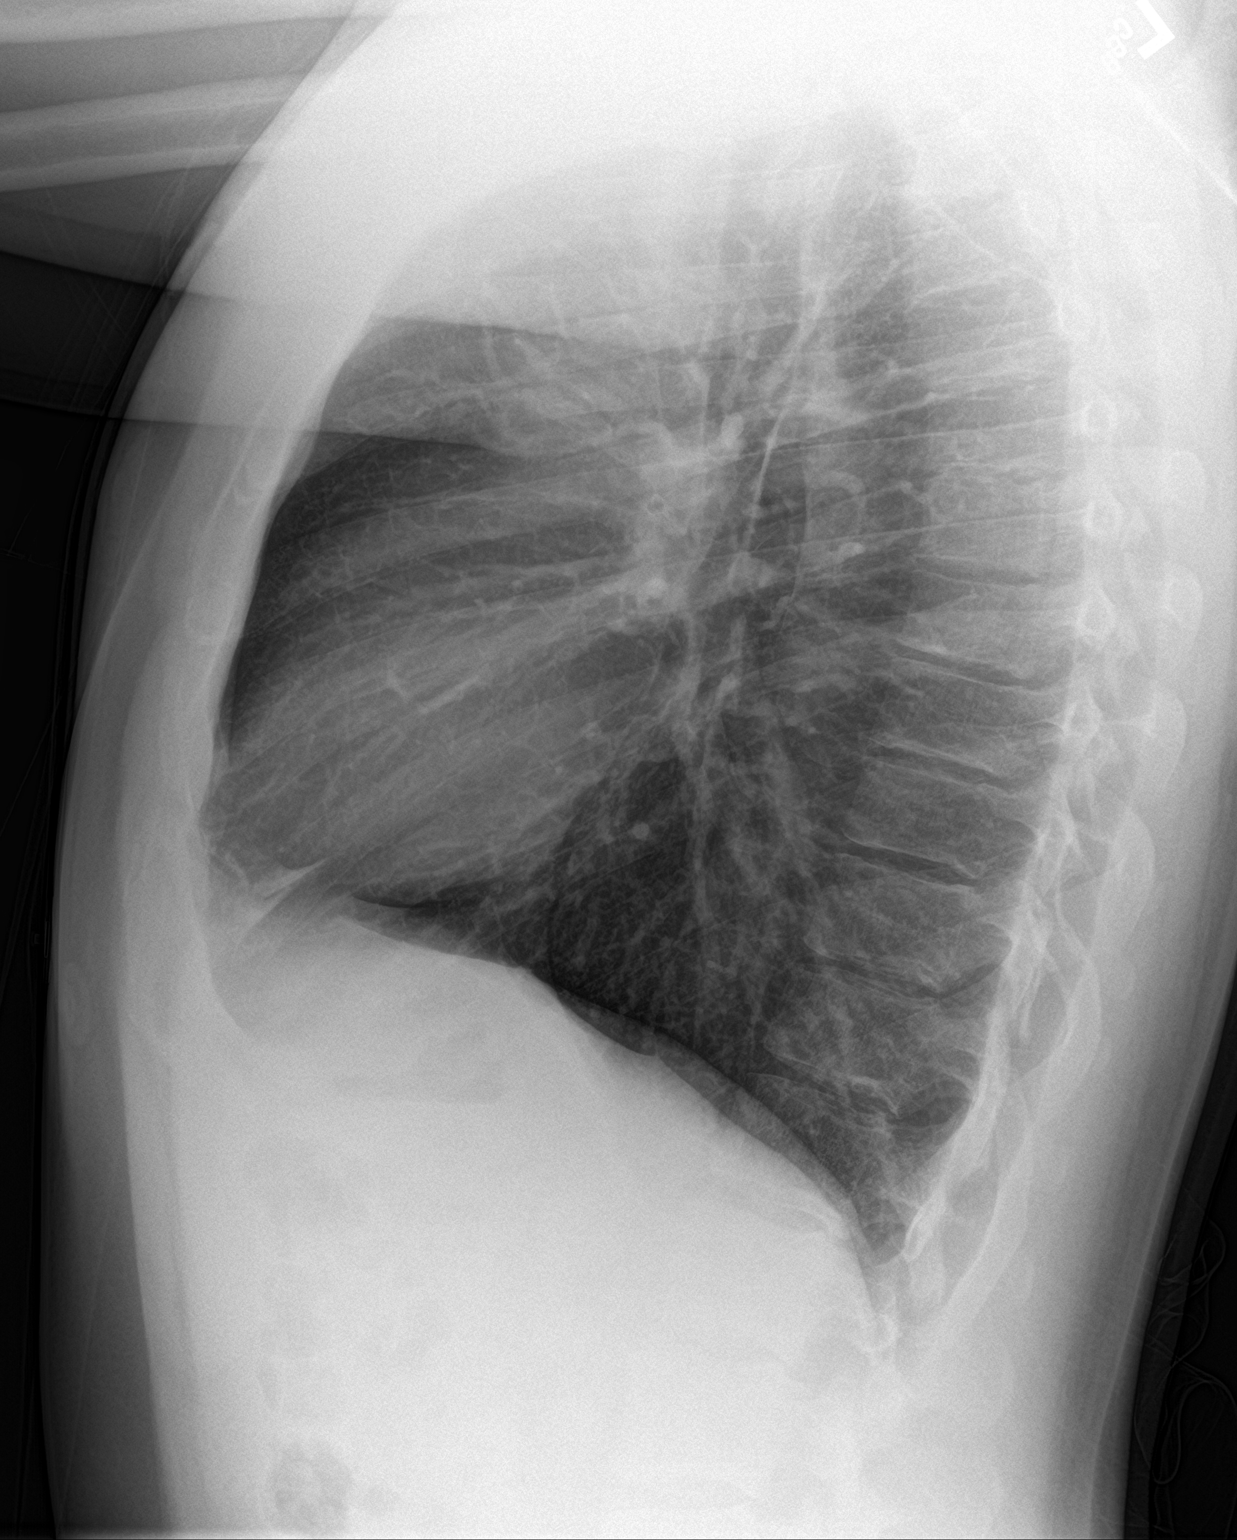

[2 of 2 positions shown; findings below may reference images not displayed]

FINDINGS: The heart size and mediastinal contours are within normal limits.
Both lungs are clear. No pleural effusion or pneumothorax. The
visualized skeletal structures are unremarkable.
IMPRESSION: No active cardiopulmonary disease.

## 2020-02-04 IMAGING — MR MR HEAD W/O CM
9 of 10 series · 43 of 48 positions shown · non-contrast
Comparison: CT head without contrast [DATE]

CLINICAL DATA: New onset of right-sided facial numbness beginning
today. Patient admits to using methamphetamines 2 days ago.

EXAM:
MRI HEAD WITHOUT CONTRAST
TECHNIQUE: Multiplanar, multiecho pulse sequences of the brain and surrounding
structures were obtained without intravenous contrast.

[Series 2: ax dwi_tracew · axial · 3.0mm · 0.71mm/px · z∈[-32,+132]mm · 7 of 56 slices shown]
[im 1/56]
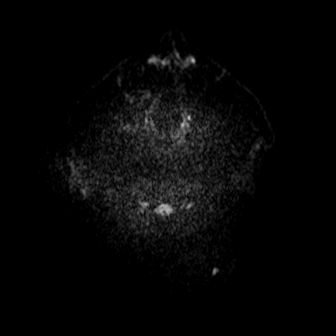
[im 10/56]
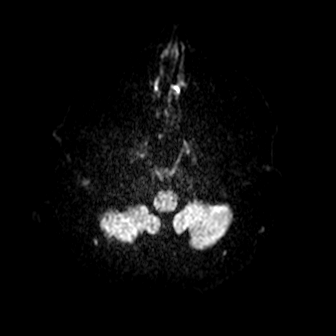
[im 19/56]
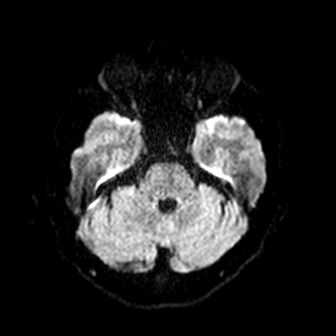
[im 28/56]
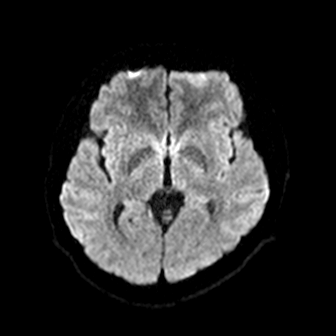
[im 37/56]
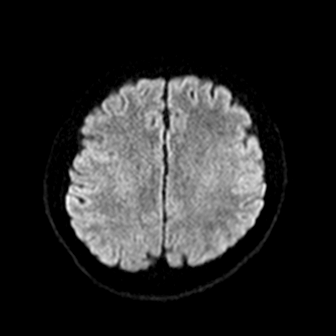
[im 46/56]
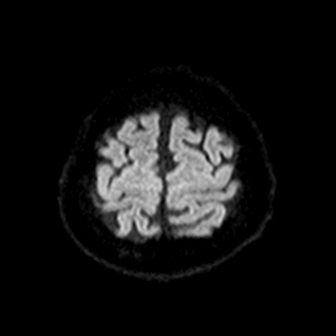
[im 56/56]
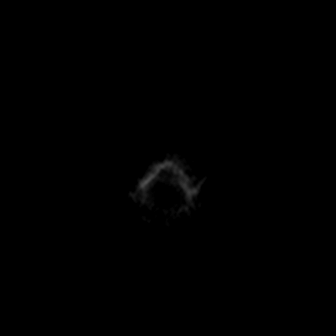

[Series 3: ax dwi_adc · axial · 3.0mm · 0.71mm/px · z∈[-32,+132]mm · 8 of 56 slices shown]
[im 1/56]
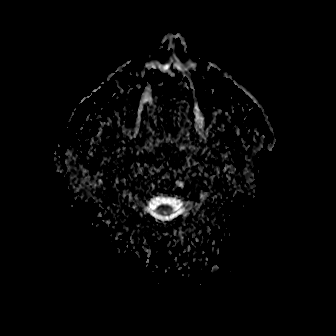
[im 8/56]
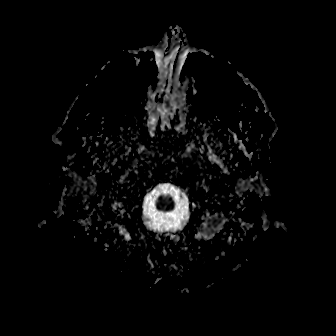
[im 16/56]
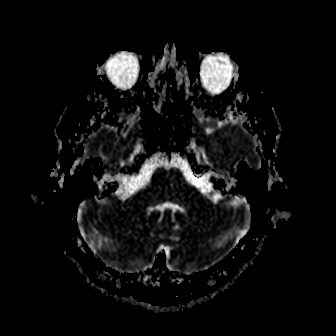
[im 24/56]
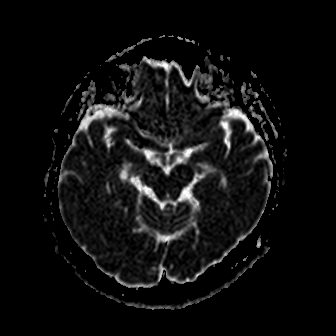
[im 32/56]
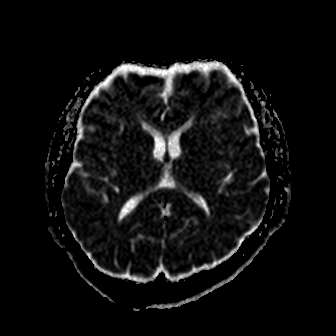
[im 40/56]
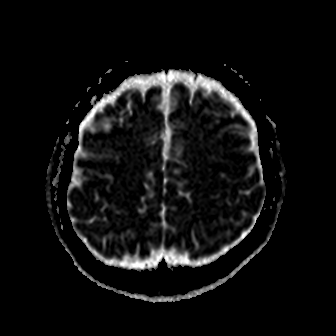
[im 48/56]
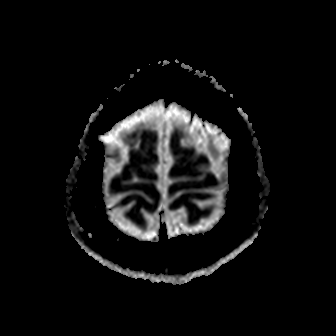
[im 56/56]
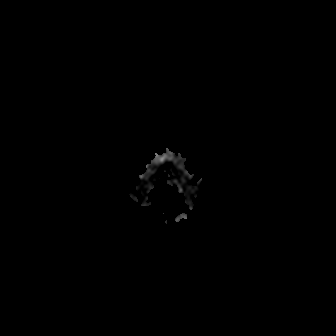

[Series 4: cor dwi_tracew · coronal · 5.0mm · 0.68mm/px · 5 of 40 slices shown]
[im 1/40]
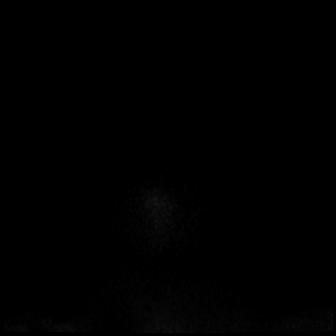
[im 10/40]
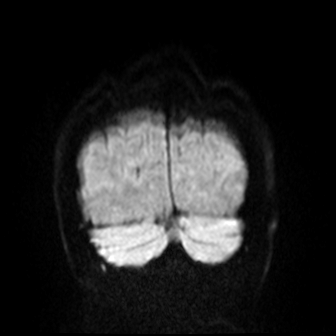
[im 20/40]
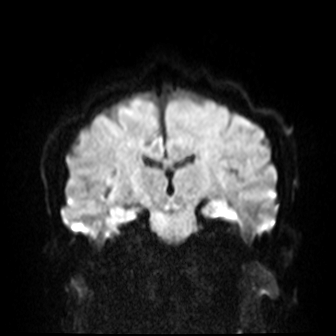
[im 30/40]
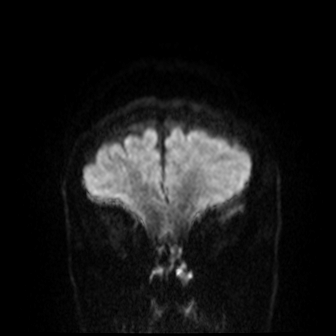
[im 40/40]
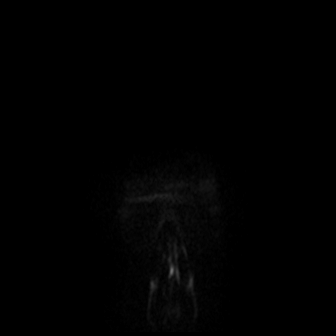

[Series 5: cor dwi_adc · coronal · 5.0mm · 0.68mm/px · 4 of 39 slices shown]
[im 1/39]
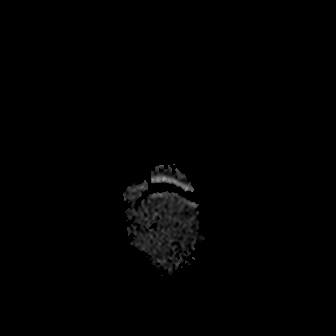
[im 10/39]
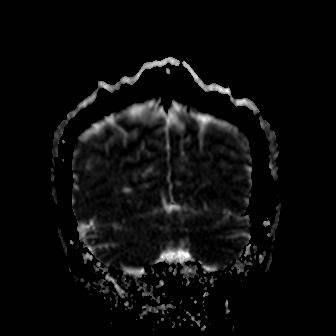
[im 20/39]
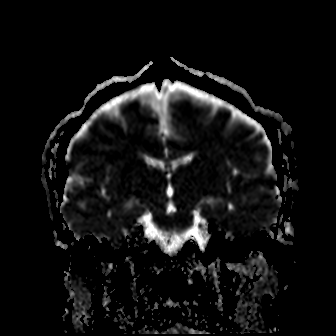
[im 29/39]
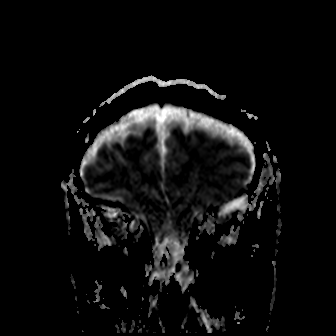

[Series 6: FLAIR · axial · 5.0mm · 1.20mm/px · z∈[-28,+128]mm · 4 of 27 slices shown]
[im 1/27]
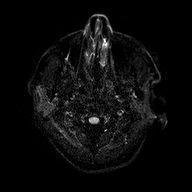
[im 9/27]
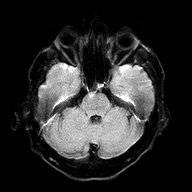
[im 18/27]
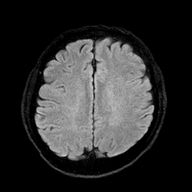
[im 27/27]
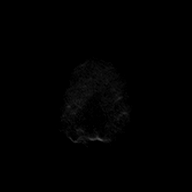

[Series 8: T2 · axial · 5.0mm · 0.45mm/px · z∈[-27,+129]mm · 4 of 27 slices shown (1 of 2)]
[im 1/27]
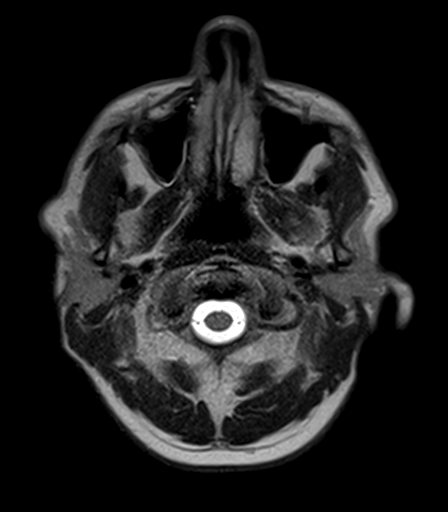
[im 9/27]
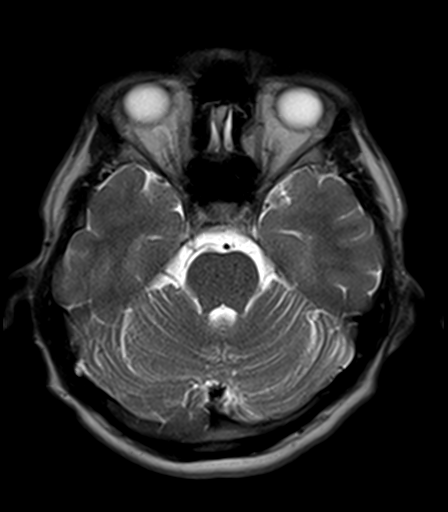
[im 18/27]
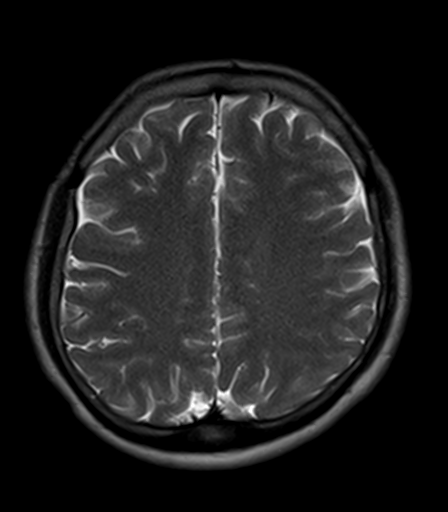
[im 27/27]
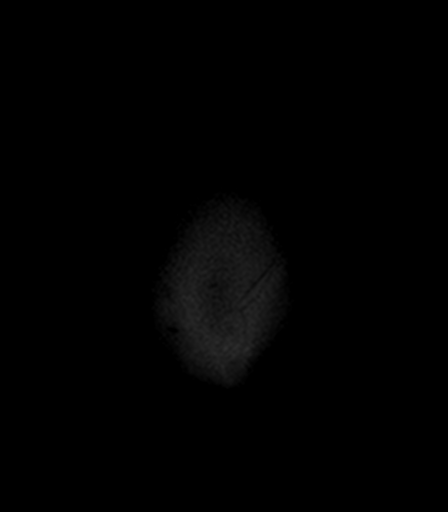

[Series 9: T1 · axial · 5.0mm · 0.90mm/px · z∈[-27,+129]mm · 4 of 27 slices shown (1 of 2)]
[im 1/27]
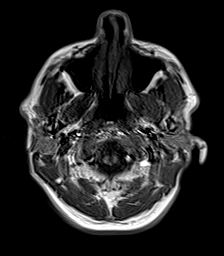
[im 9/27]
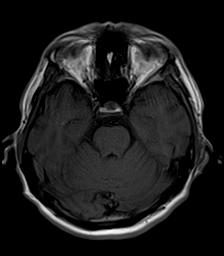
[im 18/27]
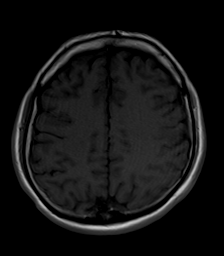
[im 27/27]
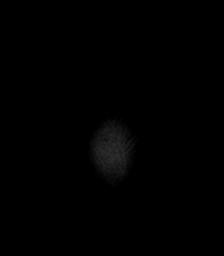

[Series 10: T1 · sagittal · 5.0mm · 0.94mm/px · 3 of 25 slices shown (2 of 2)]
[im 1/25]
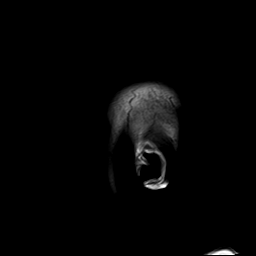
[im 13/25]
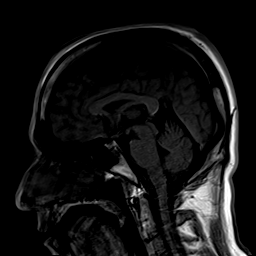
[im 25/25]
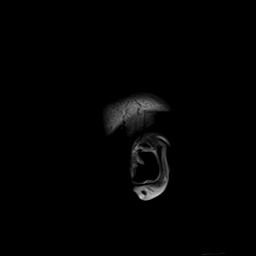

[Series 11: T2 · coronal · 5.0mm · 0.45mm/px · 4 of 33 slices shown (2 of 2)]
[im 1/33]
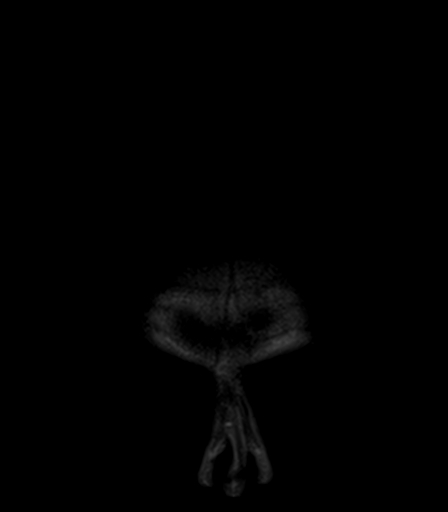
[im 11/33]
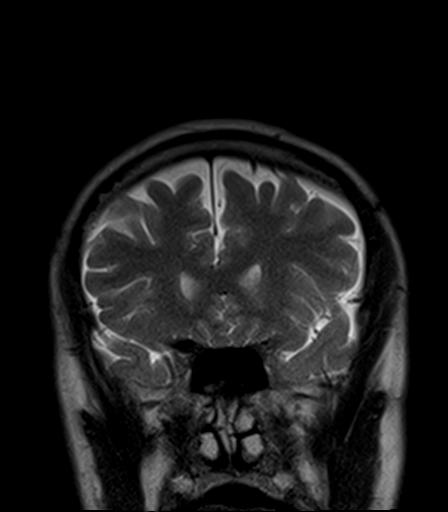
[im 22/33]
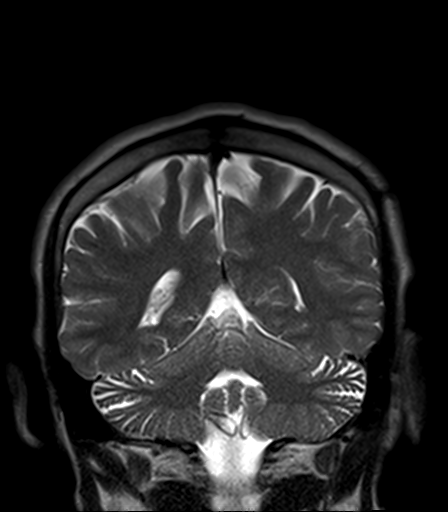
[im 33/33]
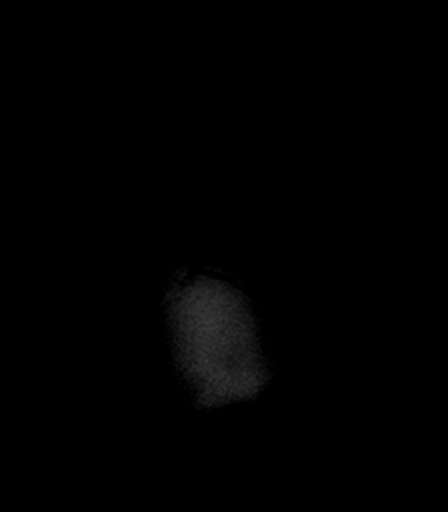

[43 of 48 positions shown; findings below may reference images not displayed]

FINDINGS: Brain: No acute infarct, hemorrhage, or mass lesion is present. No
significant white matter lesions are present. The ventricles are of
normal size. No significant extraaxial fluid collection is present.
The internal auditory canals are within normal limits. The brainstem
and cerebellum are within normal limits.

Vascular: Flow is present in the major intracranial arteries.

Skull and upper cervical spine: The craniocervical junction is
normal. Upper cervical spine is within normal limits. Marrow signal
is unremarkable.

Sinuses/Orbits: The paranasal sinuses and mastoid air cells are
clear. The globes and orbits are within normal limits.
IMPRESSION: Normal MRI of the brain.

## 2020-02-04 MED ORDER — LORAZEPAM 2 MG/ML IJ SOLN
1.0000 mg | Freq: Once | INTRAMUSCULAR | Status: AC
  Administered 2020-02-04: 1 mg via INTRAVENOUS
  Filled 2020-02-04: qty 1

## 2020-02-04 MED ORDER — SODIUM CHLORIDE 0.9% FLUSH: 3.0000 mL | Freq: Once | INTRAVENOUS | Status: DC

## 2020-02-04 MED ORDER — LABETALOL HCL 5 MG/ML IV SOLN
10.0000 mg | Freq: Once | INTRAVENOUS | Status: AC
  Administered 2020-02-04: 10 mg via INTRAVENOUS
  Filled 2020-02-04: qty 4

## 2020-02-04 MED ORDER — SODIUM CHLORIDE 0.9 % IV BOLUS
1000.0000 mL | Freq: Once | INTRAVENOUS | Status: AC
  Administered 2020-02-04: 1000 mL via INTRAVENOUS

## 2020-02-04 MED ORDER — LORAZEPAM 2 MG/ML IJ SOLN
0.5000 mg | Freq: Once | INTRAMUSCULAR | Status: AC
  Administered 2020-02-04: 13:00:00 0.5 mg via INTRAVENOUS
  Filled 2020-02-04: qty 1

## 2020-02-04 NOTE — ED Notes (Signed)
Light green and blue tubes sent to lab at this time for a recollect.

## 2020-02-04 NOTE — ED Notes (Signed)
Pt c/o of back pain "in spots that come and go" was c/o of abd pain earlier. States L foot feels numb. Points to specific area of L FA that feels numb. Pt states R side of face feels numb. Pt speech clear, moving all extremities on own. No distress noted. EDP at bedside.

## 2020-02-04 NOTE — ED Notes (Signed)
Pt stating that he has had some bleeding from his bottom for about 2 weeks. Per Dr. Dellia Cloud, not a candidate for TPA tx at this time due to this.

## 2020-02-04 NOTE — ED Notes (Signed)
Dr. Dellia Cloud at bedside.

## 2020-02-04 NOTE — ED Triage Notes (Signed)
Pt to ED via POV c/o right sided numbness and weakness that started 2 hours PTA. Pt states that LKW was 0830. Pt irritable in triage. Pt states that when he got in the shower and the water hit his face, his face started twitching. Pt has equal grips bilaterally, no facial droop noted. Pt is in NAD.

## 2020-02-04 NOTE — ED Notes (Signed)
Pt taken to MRI  

## 2020-02-04 NOTE — ED Provider Notes (Addendum)
5:26 PM Patient's RUQ ultrasound is normal.  Repeat troponin negative.  Acetaminophen level negative.  UDS is positive for amphetamines, strongly suspect this is at least partially contributing to his symptoms today.   Hepatitis panel sent for further evaluation of his LFTs.  Updated patient on results, at this time he feels comfortable with discharge.  Given information for PCP follow-up.  Patient voices understanding and is comfortable with the plan and discharge.  Radiology RUQ US IMPRESSION: Normal right upper quadrant abdominal sonogram, with no cholelithiasis.     Miguel Aschoff., MD 02/04/20 2140

## 2020-02-04 NOTE — ED Notes (Signed)
Pt given phone to contact family 

## 2020-02-04 NOTE — Consult Note (Signed)
Requesting Physician: Fuller Plan    Chief Complaint: Right arm numbness  I have been asked by Dr. Fuller Plan to see this patient in consultation for code stroke.  HPI: Danny Turner is an 29 y.o. male with a history of HTN who presents reporting that he awakened at baseline this morning.  After getting in the shower felt as if the right side of his face fell and that his tongue became thick. Then noted a change in vision and right arm numbness.  Symptoms persisted and he presented for evaluation.  Initial NIHSS of 1.     Date last known well: 02/04/2020 Time last known well: Time: 08:30 tPA Given: No: Patient reports that he has had blood in his stools for the past 2 weeks  Past medical history: HTN  Past Surgical History:  Procedure Laterality Date  . APPENDECTOMY    . LEG SURGERY Left     Family history: Patient adopted. Birth mother alive with mental health issues.  Little info about birth father   Social History:  reports that he has been smoking cigarettes. He has been smoking about 1.00 pack per day. He has never used smokeless tobacco. He reports previous alcohol use. He reports current drug use. 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 None   ROS: History obtained from the patient  General ROS: negative for - chills, fatigue, fever, night sweats, weight gain or weight loss Psychological ROS: negative for - behavioral disorder, hallucinations, memory difficulties, mood swings or suicidal ideation Ophthalmic ROS: blurry vision ENT ROS: negative for - epistaxis, nasal discharge, oral lesions, sore throat, tinnitus or vertigo Allergy and Immunology ROS: negative for - hives or itchy/watery eyes Hematological and Lymphatic ROS: negative for - bleeding problems, bruising or swollen lymph nodes Endocrine ROS: negative for - galactorrhea, hair pattern changes, polydipsia/polyuria  or temperature intolerance Respiratory ROS: negative for - cough, hemoptysis, shortness of breath or wheezing Cardiovascular ROS: negative for - chest pain, dyspnea on exertion, edema or irregular heartbeat Gastrointestinal ROS: as noted in HPI Genito-Urinary ROS: negative for - dysuria, hematuria, incontinence or urinary frequency/urgency Musculoskeletal ROS: negative for - joint swelling or muscular weakness Neurological ROS: as noted in HPI Dermatological ROS: negative for rash and skin lesion changes  Physical Examination: Blood pressure (!) 153/114, pulse (!) 109, temperature 98.5 F (36.9 C), temperature source Oral, resp. rate 18, height 5\' 11"  (1.803 m), weight 85.1 kg, SpO2 96 %.  HEENT-  Normocephalic, no lesions, without obvious abnormality.  Normal external eye and conjunctiva.  Normal TM's bilaterally.  Normal auditory canals and external ears. Normal external nose, mucus membranes and septum.  Normal pharynx. Cardiovascular- S1, S2 normal, pulses palpable throughout   Lungs- chest clear, no wheezing, rales, normal symmetric air entry Abdomen- soft, non-tender; bowel sounds normal; no masses,  no organomegaly Extremities- no edema Lymph-no adenopathy palpable Musculoskeletal-no joint tenderness, deformity or swelling Skin-warm and dry, no hyperpigmentation, vitiligo, or suspicious lesions  Neurological Examination   Mental Status: Alert, oriented, thought content appropriate.  Speech fluent without evidence of aphasia.  Able to follow 3 step commands without difficulty. Cranial Nerves: II: Discs flat bilaterally; Visual fields grossly normal, pupils equal, round, reactive to light and accommodation.  Blurred vision with binocular vision that resolves with monocular vision III,IV, VI: ptosis not present, extra-ocular motions intact bilaterally V,VII: smile symmetric, facial light touch sensation decreased on the right VIII: hearing normal bilaterally IX,X:  gag reflex  present XI: bilateral shoulder shrug XII: midline tongue extension Motor: Right : Upper extremity   5/5    Left:     Upper extremity   5/5  Lower extremity   5/5     Lower extremity   5/5 Tone and bulk:normal tone throughout; no atrophy noted Sensory: Pinprick and light touch decreased in the right upper and lower extremities Deep Tendon Reflexes: Symmetric throughout Plantars: Right: downgoing   Left: downgoing Cerebellar: Normal finger-to-nose and normal heel-to-shin testing bilaterally Gait: not tested due to safety concerns  Laboratory Studies:  Basic Metabolic Panel: No results for input(s): NA, K, CL, CO2, GLUCOSE, BUN, CREATININE, CALCIUM, MG, PHOS in the last 168 hours.  Liver Function Tests: No results for input(s): AST, ALT, ALKPHOS, BILITOT, PROT, ALBUMIN in the last 168 hours. No results for input(s): LIPASE, AMYLASE in the last 168 hours. No results for input(s): AMMONIA in the last 168 hours.  CBC: No results for input(s): WBC, NEUTROABS, HGB, HCT, MCV, PLT in the last 168 hours.  Cardiac Enzymes: No results for input(s): CKTOTAL, CKMB, CKMBINDEX, TROPONINI in the last 168 hours.  BNP: Invalid input(s): POCBNP  CBG: Recent Labs  Lab 02/04/20 1126  GLUCAP 89    Microbiology: No results found for this or any previous visit.  Coagulation Studies: No results for input(s): LABPROT, INR in the last 72 hours.  Urinalysis: No results for input(s): COLORURINE, LABSPEC, PHURINE, GLUCOSEU, HGBUR, BILIRUBINUR, KETONESUR, PROTEINUR, UROBILINOGEN, NITRITE, LEUKOCYTESUR in the last 168 hours.  Invalid input(s): APPERANCEUR  Lipid Panel: No results found for: CHOL, TRIG, HDL, CHOLHDL, VLDL, LDLCALC  HgbA1C: No results found for: HGBA1C  Urine Drug Screen:      Component Value Date/Time   LABOPIA NONE DETECTED 06/13/2017 1346   COCAINSCRNUR NONE DETECTED 06/13/2017 1346   LABBENZ POSITIVE (A) 06/13/2017 1346   AMPHETMU POSITIVE (A) 06/13/2017 1346   THCU  NONE DETECTED 06/13/2017 1346   LABBARB NONE DETECTED 06/13/2017 1346    Alcohol Level: No results for input(s): ETH in the last 168 hours.  Other results: EKG: sinus tachycardia at 133 bpm.  Imaging: CT HEAD CODE STROKE WO CONTRAST  Result Date: 02/04/2020 CLINICAL DATA:  Code stroke. Right-sided numbness that started 3 hours ago EXAM: CT HEAD WITHOUT CONTRAST TECHNIQUE: Contiguous axial images were obtained from the base of the skull through the vertex without intravenous contrast. COMPARISON:  06/13/2017 FINDINGS: Brain: No evidence of acute infarction, hemorrhage, hydrocephalus, extra-axial collection or mass lesion/mass effect. Vascular: No hyperdense vessel or unexpected calcification. Skull: Posterior scalp scarring.  No fracture. Sinuses/Orbits: No acute finding. Other: These results were called by telephone at the time of interpretation on 02/04/2020 at 11:42 am to provider [facility] , who verbally acknowledged these results. ASPECTS [facility] Stroke Program Early CT Score) - Ganglionic level infarction (caudate, lentiform nuclei, internal capsule, insula, M1-M3 cortex): 7 - Supraganglionic infarction (M4-M6 cortex): 3 Total score (0-10 with 10 being normal): 10 IMPRESSION: 1. Negative head CT. 2. ASPECTS is 10. Electronically Signed   By: Monte Fantasia M.D.   On: 02/04/2020 11:42    Assessment: 29 y.o. male with a history of HTN presenting with acute onset blurred vision and right sided numbness.  Head CT personally reviewed and shows no acute changes.  BP significantly elevated and patient tachycardic as well.  Although there is a possibility of acute infarct with history of smoking and recent drug use patient also reports GIB.  With minimal symptoms (NIHSS of 1) patient  not considered a tPA candidate.  Differential also includes hypertensive urgency (PRES) and drug effects.    Stroke Risk Factors - hypertension and smoking  Plan: 1. HgbA1c, fasting lipid panel 2. Prophylactic  therapy-Antiplatelet med: Aspirin - dose 325mg  now 3. NPO until RN stroke swallow screen 4. Telemetry monitoring 5. Frequent neuro checks 6. BP control with decrease in SBP by 20%.  Labetalol 10 mg to be given now.  7. MRI of the brain without contrast.  If no evidence of acute infarct,or PRES, since symptoms resolving with BP control, would continue ASA daily and patient may continue follow up on an outpatient basis.  Continue ASA daily. 8. Smoking and illicit drug abuse counseling  Case discussed with Dr. , MD Neurology [phone] 02/04/2020, 11:52 AM

## 2020-02-04 NOTE — ED Provider Notes (Signed)
[facility] Emergency Department Provider Note  ____________________________________________   First MD Initiated Contact with Patient 02/04/20 1134     (approximate)  I have reviewed the triage vital signs and the nursing notes.   HISTORY  Chief Complaint Code Stroke    HPI Danny Turner is a 29 y.o. male who comes in for right-sided tingling.  Patient states that he woke up this morning felt normal.  When he got in the shower around 8:30 AM on 02/04/2020 he had some right-sided tingling sensation on his face, arm, leg.  He denies any weakness.  He states that his vision he can see out of both eyes individually but when looking together it sometimes just feels weird but not able to give any other description on it.  Denies any chest pain or shortness of breath.  Patient denies alcohol use.  He states that he thinks he used meth last 2 days ago.  He also uses [name].  He says he does have a history of high blood pressure but has not taken it due to him recently getting out of jail and not having a prescription.          History reviewed. No pertinent past medical history.  There are no problems to display for this patient.   Past Surgical History:  Procedure Laterality Date  . APPENDECTOMY    . LEG SURGERY Left     Prior to Admission medications   Not on File    Allergies Patient has no known allergies.  No family history on file.  Social History Social History   Tobacco Use  . Smoking status: Current Every Day Smoker    Packs/day: 1.00    Types: Cigarettes  . Smokeless tobacco: Never Used  Substance Use Topics  . Alcohol use: Not Currently    Comment: daily use  . Drug use: Yes    Types: Methamphetamines      Review of Systems Constitutional: No fever/chills Eyes: No visual changes. ENT: No sore throat. Cardiovascular: Denies chest pain. Respiratory: Denies shortness of breath. Gastrointestinal: No abdominal pain.  No  nausea, no vomiting.  No diarrhea.  No constipation. Genitourinary: Negative for dysuria. Musculoskeletal: Negative for back pain. Skin: Negative for rash. Neurological: Negative for headaches, focal weakness, right-sided weakness and tingling All other ROS negative ____________________________________________   PHYSICAL EXAM:  VITAL SIGNS: ED Triage Vitals  Enc Vitals Group     BP 02/04/20 1118 (!) 153/91     Pulse Rate 02/04/20 1118 (!) 129     Resp 02/04/20 1118 18     Temp 02/04/20 1118 98.5 F (36.9 C)     Temp Source 02/04/20 1118 Oral     SpO2 02/04/20 1118 99 %     Weight 02/04/20 1118 187 lb 9.8 oz (85.1 kg)     Height 02/04/20 1118 5\' 11"  (1.803 m)     Head Circumference --      Peak Flow --      Pain Score 02/04/20 1126 0     Pain Loc --      Pain Edu? --      Excl. in GC? --     Constitutional: Alert and oriented. Well appearing and in no acute distress. Eyes: Conjunctivae are normal. EOMI. Head: Atraumatic. Nose: No congestion/rhinnorhea. Mouth/Throat: Mucous membranes are moist.   Neck: No stridor. Trachea Midline. FROM Cardiovascular: Tachycardic. Grossly normal heart sounds.  Good peripheral circulation. Respiratory: Normal respiratory effort.  No retractions.  Lungs CTAB. Gastrointestinal: Soft and nontender. No distention. No abdominal bruits.  Musculoskeletal: No lower extremity tenderness nor edema.  No joint effusions. Neurologic:  Normal speech and language. No gross focal neurologic deficits are appreciated.  Right-sided tingling sensation on his face arm and leg.  NIH stroke scale of 1 Skin:  Skin is warm, dry and intact. No rash noted. Psychiatric: Mood and affect are normal. Speech and behavior are normal. GU: Deferred   ____________________________________________   LABS (all labs ordered are listed, but only abnormal results are displayed)  Labs Reviewed  CBC - Abnormal; Notable for the following components:      Result Value   WBC 11.3  (*)    RBC 5.94 (*)    Hemoglobin 17.1 (*)    All other components within normal limits  DIFFERENTIAL - Abnormal; Notable for the following components:   Neutro Abs 8.4 (*)    All other components within normal limits  COMPREHENSIVE METABOLIC PANEL - Abnormal; Notable for the following components:   BUN 21 (*)    AST 87 (*)    ALT 266 (*)    Total Bilirubin 1.9 (*)    All other components within normal limits  GLUCOSE, CAPILLARY  ETHANOL  PROTIME-INR  APTT  LIPASE, BLOOD  URINE DRUG SCREEN, QUALITATIVE ([facility] ONLY)  ACETAMINOPHEN LEVEL  HEPATITIS PANEL, ACUTE  CBG MONITORING, ED  TROPONIN I (HIGH SENSITIVITY)  TROPONIN I (HIGH SENSITIVITY)   ____________________________________________   ED ECG REPORT I, Concha Se, the attending physician, personally viewed and interpreted this ECG.  EKG was sinus tachycardia no ST elevation, no T wave inversions, normal intervals ____________________________________________  RADIOLOGY Vela Prose, personally viewed and evaluated these images (plain radiographs) as part of my medical decision making, as well as reviewing the written report by the radiologist.  ED MD interpretation: CT was personally reviewed and negative  Official radiology report(s): DG Chest 2 View  Result Date: 02/04/2020 CLINICAL DATA:  Right-sided numbness beginning 2 hours ago. EXAM: CHEST - 2 VIEW COMPARISON:  06/13/2017 FINDINGS: The heart size and mediastinal contours are within normal limits. Both lungs are clear. No pleural effusion or pneumothorax. The visualized skeletal structures are unremarkable. IMPRESSION: No active cardiopulmonary disease. Electronically Signed   By: Amie Portland M.D.   On: 02/04/2020 14:20   MR BRAIN WO CONTRAST  Result Date: 02/04/2020 CLINICAL DATA:  New onset of right-sided facial numbness beginning today. Patient admits to using methamphetamines 2 days ago. EXAM: MRI HEAD WITHOUT CONTRAST TECHNIQUE: Multiplanar, multiecho  pulse sequences of the brain and surrounding structures were obtained without intravenous contrast. COMPARISON:  CT head without contrast 02/04/2020 FINDINGS: Brain: No acute infarct, hemorrhage, or mass lesion is present. No significant white matter lesions are present. The ventricles are of normal size. No significant extraaxial fluid collection is present. The internal auditory canals are within normal limits. The brainstem and cerebellum are within normal limits. Vascular: Flow is present in the major intracranial arteries. Skull and upper cervical spine: The craniocervical junction is normal. Upper cervical spine is within normal limits. Marrow signal is unremarkable. Sinuses/Orbits: The paranasal sinuses and mastoid air cells are clear. The globes and orbits are within normal limits. IMPRESSION: Normal MRI of the brain. Electronically Signed   By: Marin Roberts M.D.   On: 02/04/2020 14:15   CT HEAD CODE STROKE WO CONTRAST  Result Date: 02/04/2020 CLINICAL DATA:  Code stroke. Right-sided numbness that started 3 hours ago EXAM: CT HEAD WITHOUT CONTRAST  TECHNIQUE: Contiguous axial images were obtained from the base of the skull through the vertex without intravenous contrast. COMPARISON:  06/13/2017 FINDINGS: Brain: No evidence of acute infarction, hemorrhage, hydrocephalus, extra-axial collection or mass lesion/mass effect. Vascular: No hyperdense vessel or unexpected calcification. Skull: Posterior scalp scarring.  No fracture. Sinuses/Orbits: No acute finding. Other: These results were called by telephone at the time of interpretation on 02/04/2020 at 11:42 am to provider [facility] , who verbally acknowledged these results. ASPECTS [facility] Stroke Program Early CT Score) - Ganglionic level infarction (caudate, lentiform nuclei, internal capsule, insula, M1-M3 cortex): 7 - Supraganglionic infarction (M4-M6 cortex): 3 Total score (0-10 with 10 being normal): 10 IMPRESSION: 1. Negative head CT. 2.  ASPECTS is 10. Electronically Signed   By: Marnee Spring M.D.   On: 02/04/2020 11:42    ____________________________________________   PROCEDURES  Procedure(s) performed (including Critical Care):  Procedures   ____________________________________________   INITIAL IMPRESSION / ASSESSMENT AND PLAN / ED COURSE  Danny Turner was evaluated in Emergency Department on 02/04/2020 for the symptoms described in the history of present illness. He was evaluated in the context of the global COVID-19 pandemic, which necessitated consideration that the patient might be at risk for infection with the SARS-CoV-2 virus that causes COVID-19. Institutional protocols and algorithms that pertain to the evaluation of patients at risk for COVID-19 are in a state of rapid change based on information released by regulatory bodies including the CDC and federal and state organizations. These policies and algorithms were followed during the patient's care in the ED.    Patient is a 29 year old gentleman with hypertension who comes in with right-sided sensory deficits.  Stroke code was called in triage.  Glucose was normal.  Patient was seen by neurologist Dr. Thad Ranger at bedside.  Stroke scale was 1.  We discussed TPA although patient was not a candidate due to reporting a history of bright red blood per rectum for 2 weeks.  Also I feel that the risk outweighed the benefit.  Patient does report some drug use but could be contributing to symptoms.  Will get labs to evaluate for electrolyte abnormalities, AKI.  Does not need CTA given not thrombectomy candidate due to low NIH stroke scale  Discussed with neurology and patient's symptoms are getting better after the labetalol to try to lower his blood pressure.  Although patient now is having pain in his toes as well as tingling sensation in his chest.  We will add on cardiac marker although does not sound like he is really having chest pain.  Discussed with neurology  that from their perspective if the MRI is negative that he can be discharged.  12:27 PM patient now stating that he is having discomfort in his abdomen.  Reevaluated patient and the pain is in the upper abdomen.  Patient CMP and lipase still pending.  1:28 PM reevaluated patient and discussed doing a rectal due to him saying that he was having bleeding.  Patient is declining at this time.  Hemoglobin is stable so this is reasonable.  Patient now states that he has some discomfort and tingling in his left elbow a little bit of pain in his right ear and some stabbing pains throughout his back.  We will add on chest x-ray given patient is now reporting some shortness of breath as well.  Patient does state that the Ativan did seem to help his symptoms  3:44 PM reevaluated patient he states that most of his symptoms are  better but he still having some tingling in his face and some random stabbing discomforts.  I updated him on the negative MRI.  Updated him on getting an ultrasound given his slightly elevated LFTs.  We will give another dose of Ativan since it seems to be improving his symptoms.  Patient does report history of anxiety and PTSD and I told him that stress could be contributing to his symptoms.  We also discussed this could be related to drug use.  Patient states he had similar symptoms before for being on drugs but states he has not used anything in 2 days.  Patient handed off to oncoming team pending ultrasound and reevaluation.       ____________________________________________   FINAL CLINICAL IMPRESSION(S) / ED DIAGNOSES   Final diagnoses:  Elevated LFTs  Tingling      MEDICATIONS GIVEN DURING THIS VISIT:  Medications  LORazepam (ATIVAN) injection 1 mg (has no administration in time range)  labetalol (NORMODYNE) injection 10 mg (10 mg Intravenous Given 02/04/20 1152)  LORazepam (ATIVAN) injection 0.5 mg (0.5 mg Intravenous Given 02/04/20 1238)  sodium chloride 0.9 % bolus  1,000 mL (1,000 mLs Intravenous New Bag/Given 02/04/20 1324)  LORazepam (ATIVAN) injection 1 mg (1 mg Intravenous Given 02/04/20 1334)     ED Discharge Orders    None       Note:  This document was prepared using Dragon voice recognition software and may include unintentional dictation errors.   Vanessa Dayton, MD 02/04/20 [phone]

## 2020-02-04 NOTE — ED Notes (Signed)
Pt states that he does not drink but occasionally does meth. Pt last snorted meth about 2 days ago.

## 2020-02-04 NOTE — Progress Notes (Signed)
CH paged to ED for code stroke; pt. in CT scan when [facility] arrived; neurology examined pt. shortly after he returned to rm.  After medical team finished initial workup, CH introduced himself.  Pt. 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  Reported he sometimes goes to church and was amenable to [facility] offering a prayer for pt.'s treatment.  No further needs expressed; CH remains available as needed.    02/04/20 1130  Clinical Encounter Type  Visited With Patient;Health care provider  Visit Type Initial;Code;Social support  Referral From Nurse  Spiritual Encounters  Spiritual Needs Emotional  Stress Factors  Patient Stress Factors Health changes

## 2020-02-04 NOTE — Discharge Instructions (Addendum)
Thank you for letting us take care of you in the emergency department today.  ° °Please continue to take any regular, prescribed medications.  ° °Please follow up with: °- Your primary care doctor to review your ER visit and follow up on your symptoms.  ° °Please return to the ER for any new or worsening symptoms.  ° °

## 2020-02-05 ENCOUNTER — Telehealth: Payer: Self-pay | Admitting: Emergency Medicine

## 2020-02-05 NOTE — Telephone Encounter (Signed)
Called patient to inform of hep c reactive result.  He says he may have been told that he had hep c before, but doesn't sound sure. I told him o fthe importance of follow up and treatment.  [facility] not [facility].  I also gave him the number for [facility].

## 2020-06-04 ENCOUNTER — Telehealth: Payer: Self-pay | Admitting: General Practice

## 2020-06-04 NOTE — Telephone Encounter (Signed)
Individual has been contacted 3+ times regarding ED referral. No further attempts to contact individual will be made. 

## 2021-03-12 ENCOUNTER — Other Ambulatory Visit: Payer: Self-pay

## 2021-03-12 ENCOUNTER — Emergency Department
Admission: EM | Admit: 2021-03-12 | Discharge: 2021-03-12 | Disposition: A | Discharge status: Home / Self Care | Payer: Self-pay | Attending: Emergency Medicine | Admitting: Emergency Medicine

## 2021-03-12 DIAGNOSIS — Z202 Contact with and (suspected) exposure to infections with a predominantly sexual mode of transmission: Secondary | ICD-10-CM | POA: Insufficient documentation

## 2021-03-12 DIAGNOSIS — F17210 Nicotine dependence, cigarettes, uncomplicated: Secondary | ICD-10-CM | POA: Insufficient documentation

## 2021-03-12 LAB — CHLAMYDIA/NGC RT PCR (ARMC ONLY)
Chlamydia Tr: NOT DETECTED
N gonorrhoeae: NOT DETECTED

## 2021-03-12 MED ORDER — CEFTRIAXONE SODIUM 250 MG IJ SOLR
250.0000 mg | Freq: Once | INTRAMUSCULAR | Status: AC
  Administered 2021-03-12: 250 mg via INTRAMUSCULAR
  Filled 2021-03-12: qty 250

## 2021-03-12 MED ORDER — AZITHROMYCIN 500 MG PO TABS
1000.0000 mg | ORAL_TABLET | Freq: Once | ORAL | Status: AC
  Administered 2021-03-12: 1000 mg via ORAL
  Filled 2021-03-12: qty 2

## 2021-03-12 NOTE — ED Notes (Signed)
All documentation completed by Dorian James SRN reviewed and approved by this RN 

## 2021-03-12 NOTE — ED Triage Notes (Signed)
Pt via POV with C/O exposure to partner yesterday 04/19 who has tested positive for gonorrhea. Pt states that he is not having any urinary symptoms such as dysuria.
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
## 2021-05-28 ENCOUNTER — Emergency Department
Admission: EM | Admit: 2021-05-28 | Discharge: 2021-05-29 | Disposition: A | Discharge status: Home / Self Care | Payer: Self-pay | Attending: Emergency Medicine | Admitting: Emergency Medicine

## 2021-05-28 ENCOUNTER — Other Ambulatory Visit: Payer: Self-pay

## 2021-05-28 DIAGNOSIS — R079 Chest pain, unspecified: Secondary | ICD-10-CM | POA: Insufficient documentation

## 2021-05-28 DIAGNOSIS — F17210 Nicotine dependence, cigarettes, uncomplicated: Secondary | ICD-10-CM | POA: Insufficient documentation

## 2021-05-28 DIAGNOSIS — R1011 Right upper quadrant pain: Secondary | ICD-10-CM | POA: Insufficient documentation

## 2021-05-28 DIAGNOSIS — F1510 Other stimulant abuse, uncomplicated: Secondary | ICD-10-CM | POA: Insufficient documentation

## 2021-05-28 DIAGNOSIS — Y900 Blood alcohol level of less than 20 mg/100 ml: Secondary | ICD-10-CM | POA: Insufficient documentation

## 2021-05-28 LAB — CBC
HCT: 43.5 % (ref 39.0–52.0)
Hemoglobin: 14.7 g/dL (ref 13.0–17.0)
MCH: 29.3 pg (ref 26.0–34.0)
MCHC: 33.8 g/dL (ref 30.0–36.0)
MCV: 86.7 fL (ref 80.0–100.0)
Platelets: 271 10*3/uL (ref 150–400)
RBC: 5.02 MIL/uL (ref 4.22–5.81)
RDW: 13 % (ref 11.5–15.5)
WBC: 6.9 10*3/uL (ref 4.0–10.5)
nRBC: 0 % (ref 0.0–0.2)

## 2021-05-28 LAB — ETHANOL: Alcohol, Ethyl (B): <10 mg/dL (ref <10)

## 2021-05-28 NOTE — ED Triage Notes (Signed)
PT in with co meth abuse states wants to detox. Also co chest pain and abd pain.

## 2021-05-29 ENCOUNTER — Emergency Department: Payer: Self-pay

## 2021-05-29 LAB — COMPREHENSIVE METABOLIC PANEL
ALT: 291 U/L — ABNORMAL HIGH (ref 0–44)
AST: 124 U/L — ABNORMAL HIGH (ref 15–41)
Albumin: 4.6 g/dL (ref 3.5–5.0)
Alkaline Phosphatase: 47 U/L (ref 38–126)
Anion gap: 7 (ref 5–15)
BUN: 20 mg/dL (ref 6–20)
CO2: 28 mmol/L (ref 22–32)
Calcium: 9.8 mg/dL (ref 8.9–10.3)
Chloride: 104 mmol/L (ref 98–111)
Creatinine, Ser: 0.95 mg/dL (ref 0.61–1.24)
GFR, Estimated: >60 mL/min (ref >60)
Glucose, Bld: 140 mg/dL — ABNORMAL HIGH (ref 70–99)
Potassium: 4.1 mmol/L (ref 3.5–5.1)
Sodium: 139 mmol/L (ref 135–145)
Total Bilirubin: 1.5 mg/dL — ABNORMAL HIGH (ref 0.3–1.2)
Total Protein: 8.5 g/dL — ABNORMAL HIGH (ref 6.5–8.1)

## 2021-05-29 LAB — TROPONIN I (HIGH SENSITIVITY)
Troponin I (High Sensitivity): 3 ng/L (ref <18)
Troponin I (High Sensitivity): 3 ng/L (ref <18)

## 2021-05-29 IMAGING — US US ABDOMEN LIMITED
1 series · 14 of 25 positions shown · non-contrast
Comparison: [DATE]

CLINICAL DATA: Epigastric and right upper quadrant pain tonight.
Elevated liver function studies.

EXAM:
ULTRASOUND ABDOMEN LIMITED RIGHT UPPER QUADRANT

[Series 1: us abdomen limited ruq (liver/gb) · 14 of 42 slices shown]
[im 1/42]
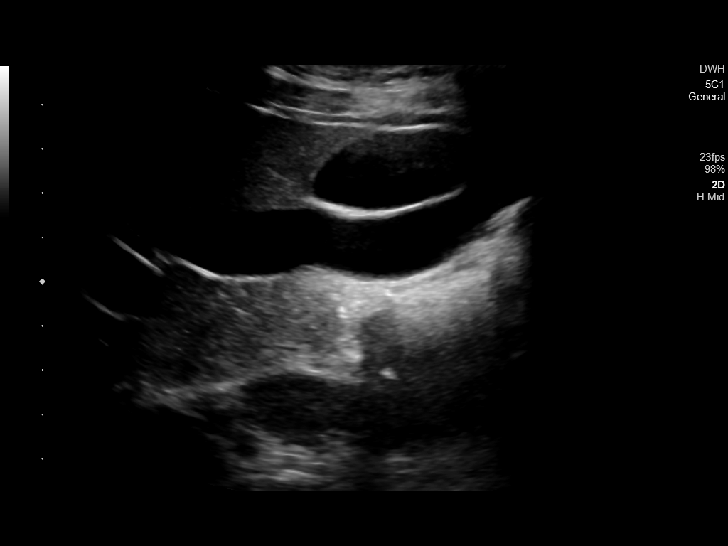
[im 4/42]
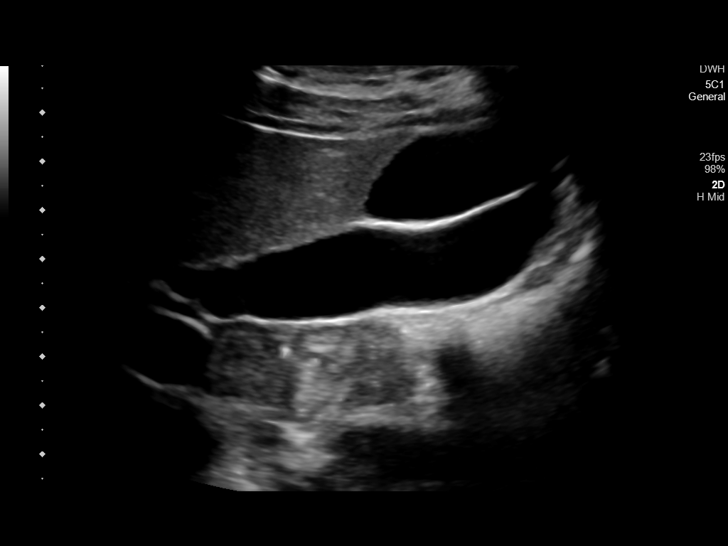
[im 7/42]
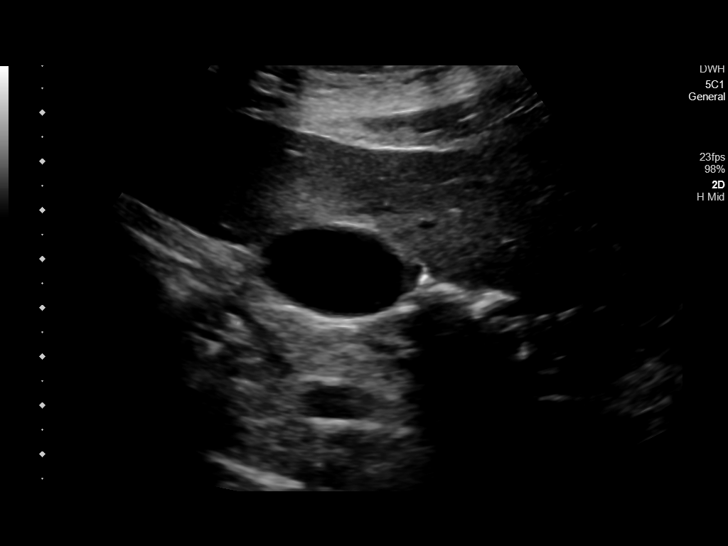
[im 11/42]
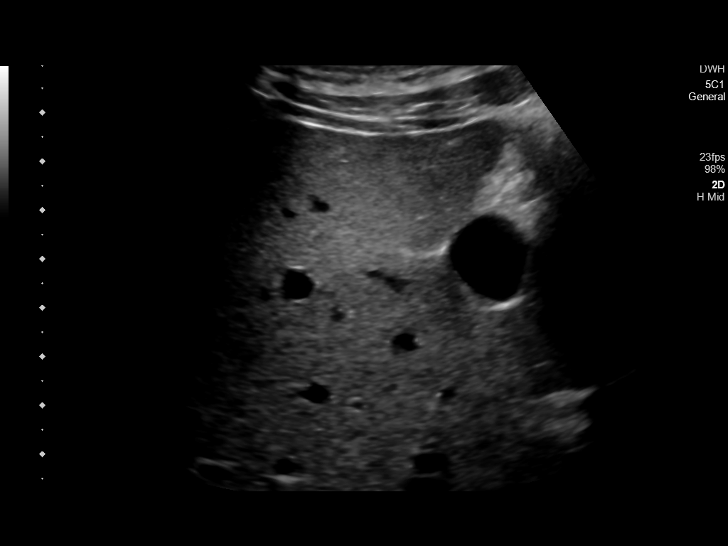
[im 14/42]
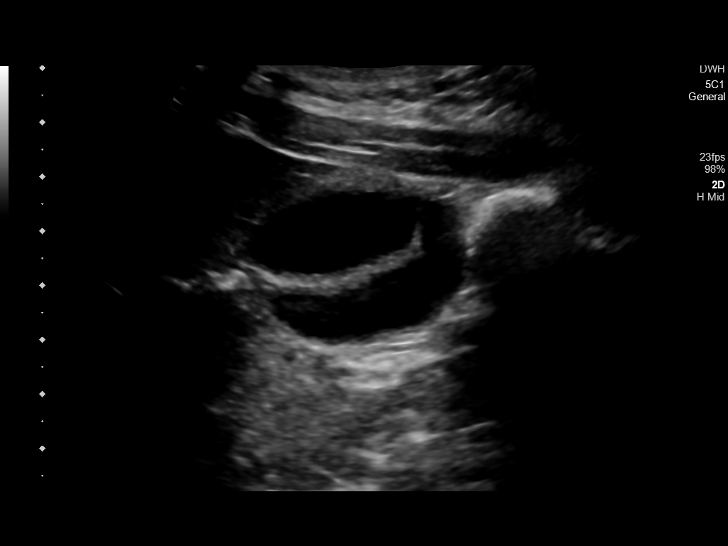
[im 16/42]
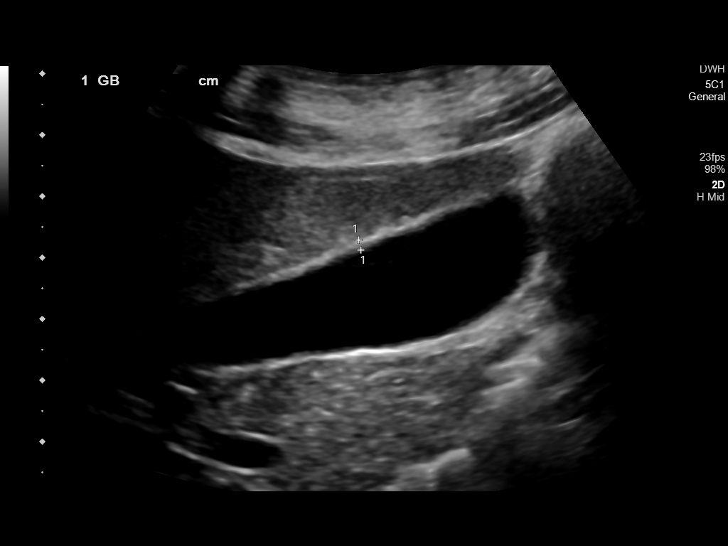
[im 19/42]
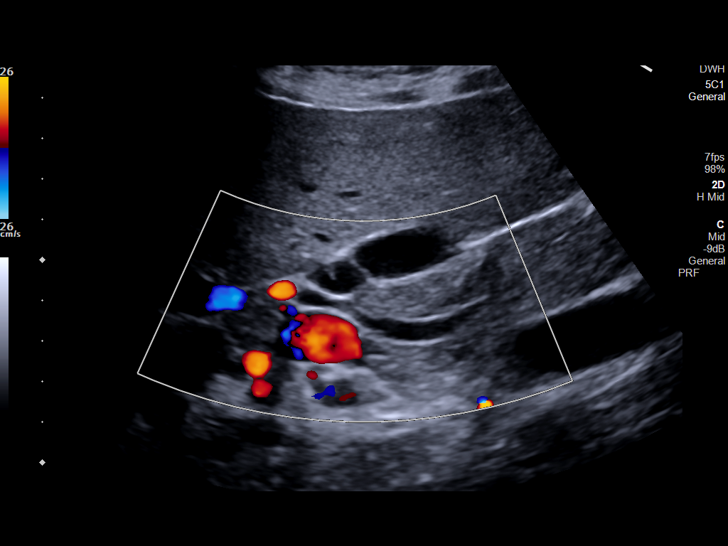
[im 23/42]
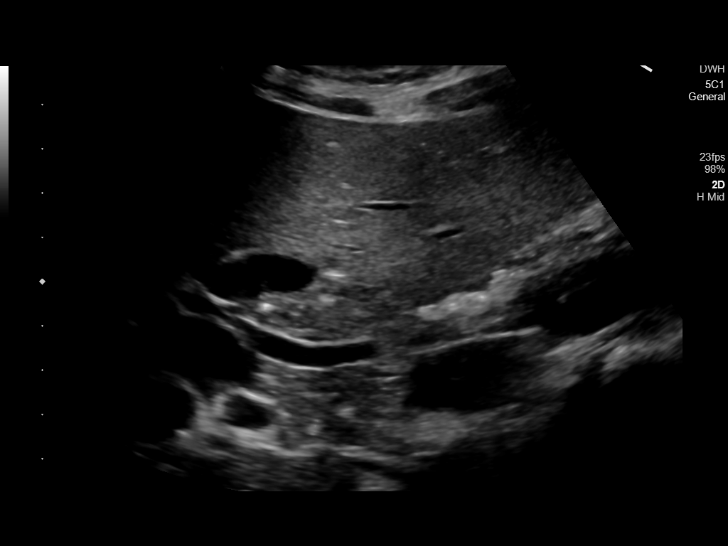
[im 26/42]
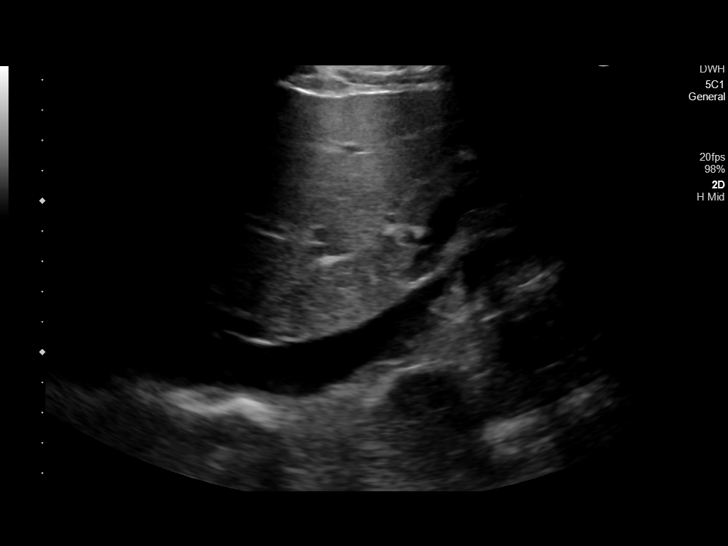
[im 28/42]
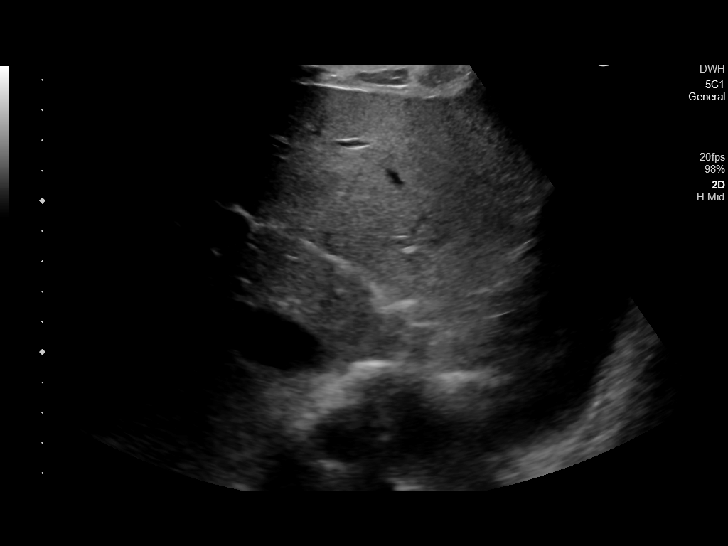
[im 31/42]
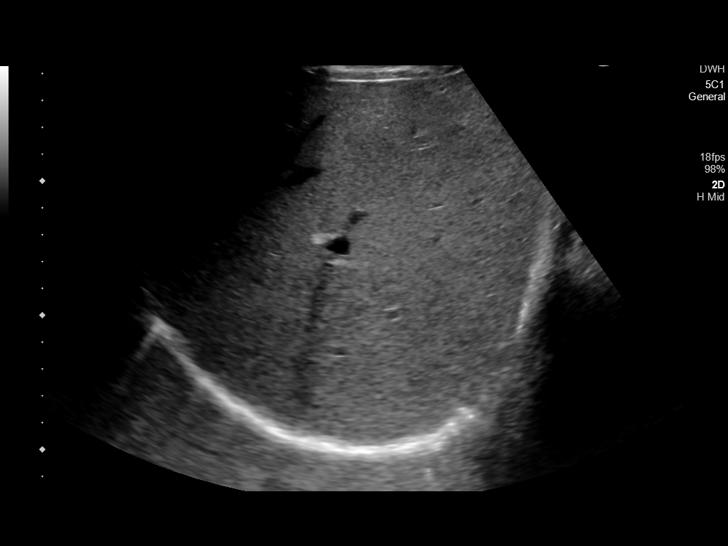
[im 35/42]
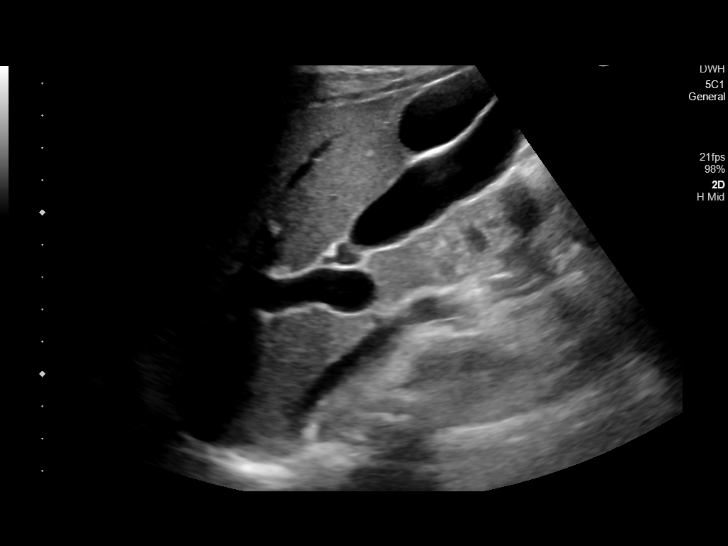
[im 38/42]
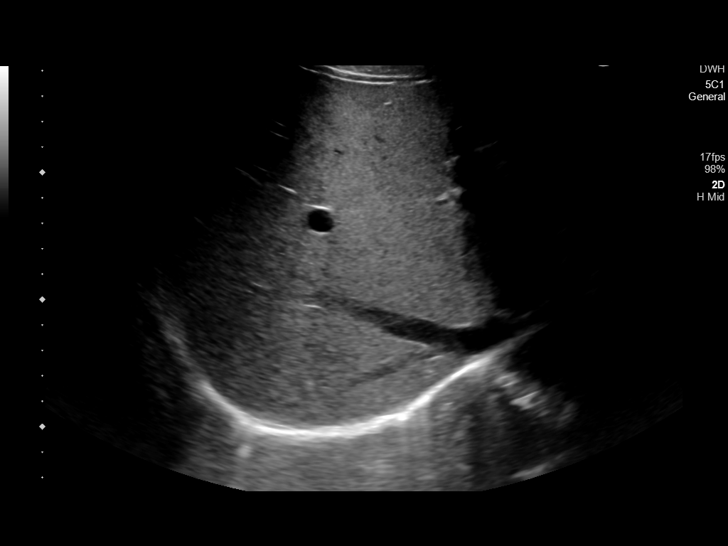
[im 42/42]
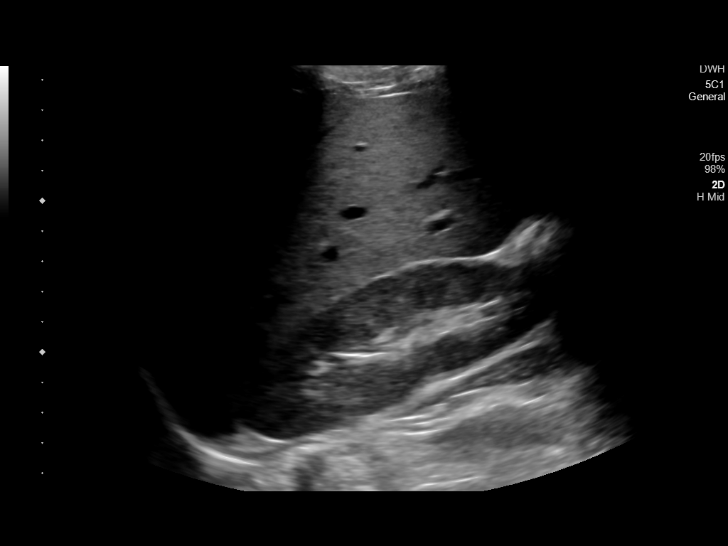

[14 of 25 positions shown; findings below may reference images not displayed]

FINDINGS: Gallbladder:

No gallstones or wall thickening visualized. No sonographic Murphy
sign noted by sonographer.

Common bile duct:

Diameter: 4 mm, normal

Liver:

No focal lesion identified. Within normal limits in parenchymal
echogenicity. Portal vein is patent on color Doppler imaging with
normal direction of blood flow towards the liver.

Other: None.
IMPRESSION: Normal examination. No evidence of cholelithiasis or acute
cholecystitis.

## 2021-05-29 IMAGING — CR DG CHEST 2V
2 series · 2 of 2 positions shown · non-contrast
Comparison: [DATE]

CLINICAL DATA: Chest pain

EXAM:
CHEST - 2 VIEW

[chest pa]
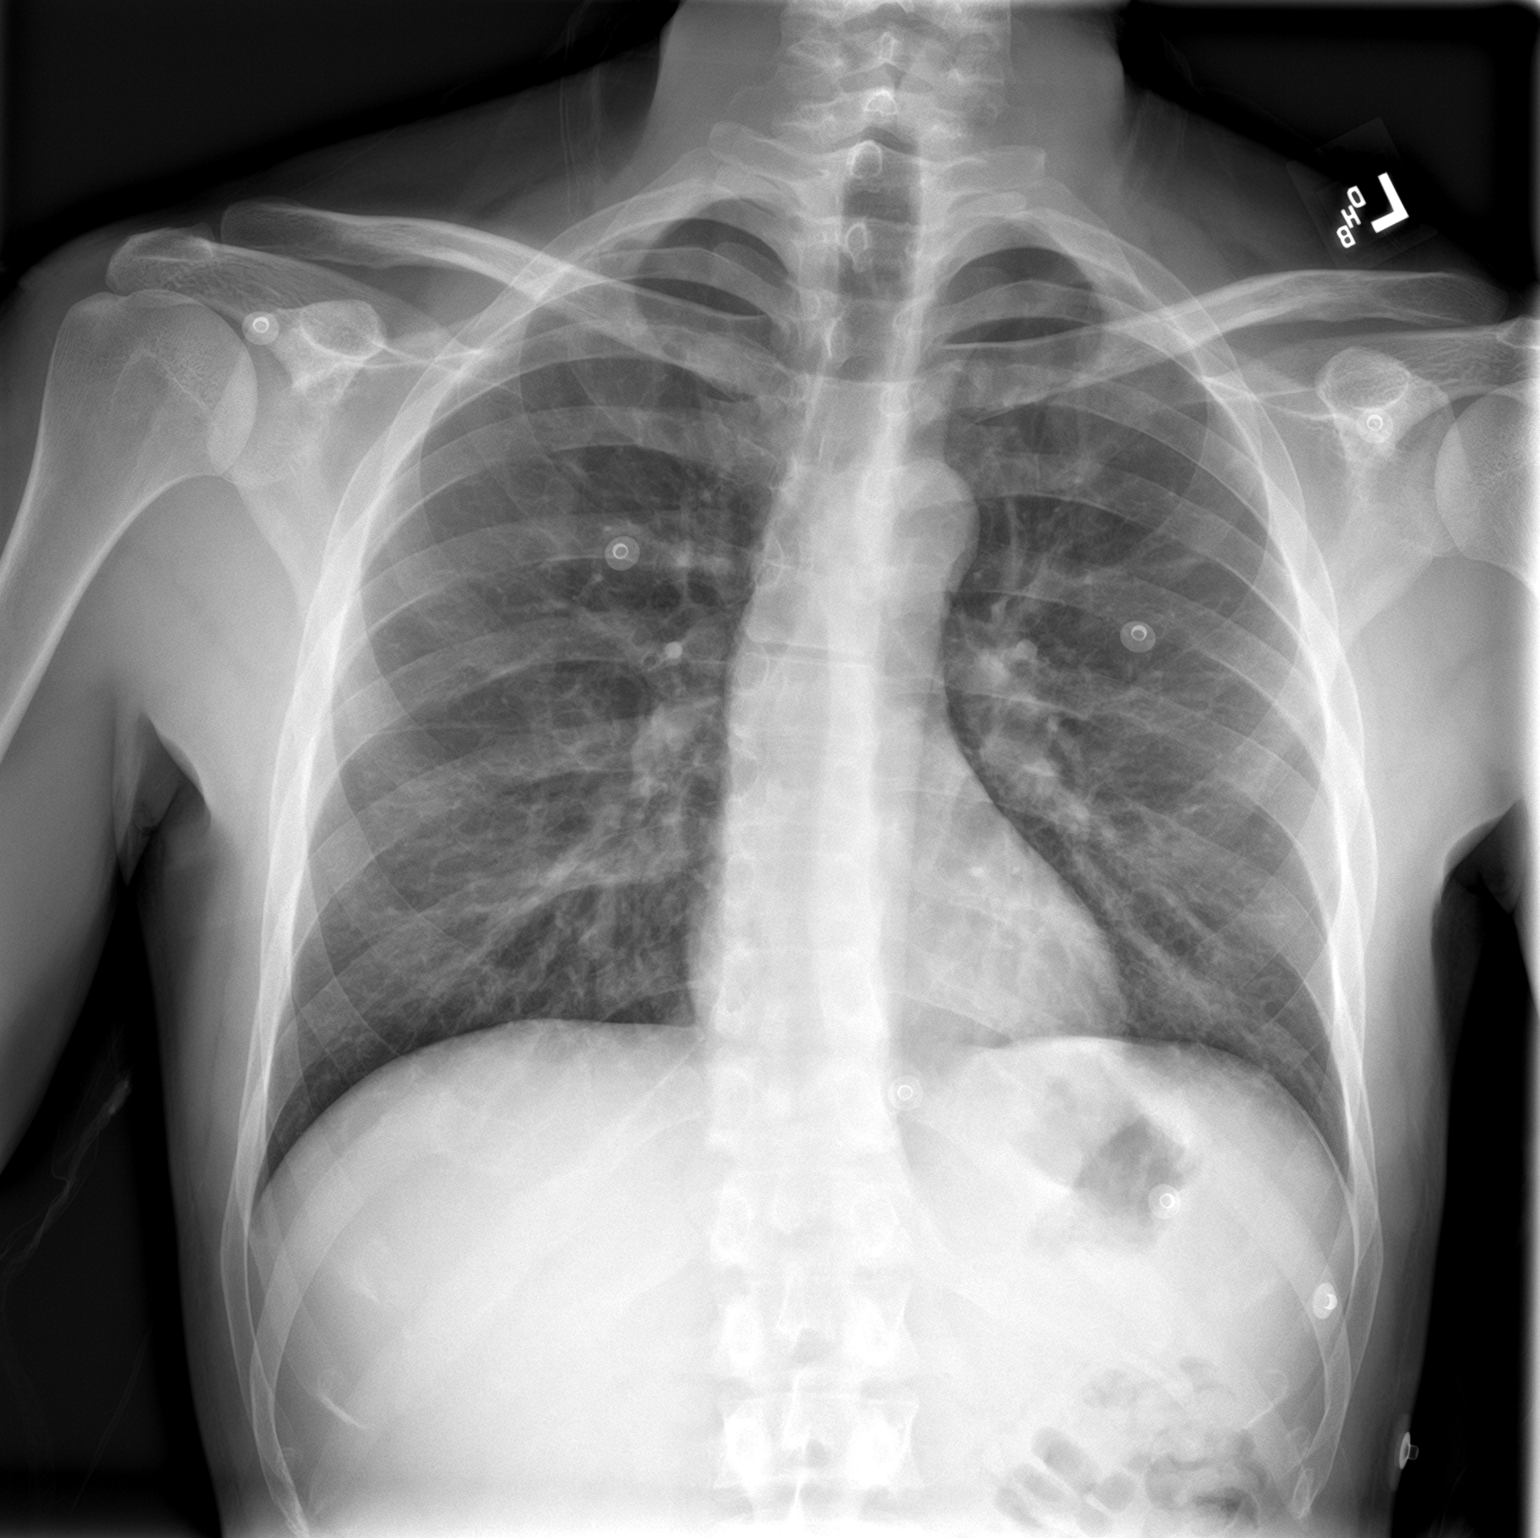

[chest lat]
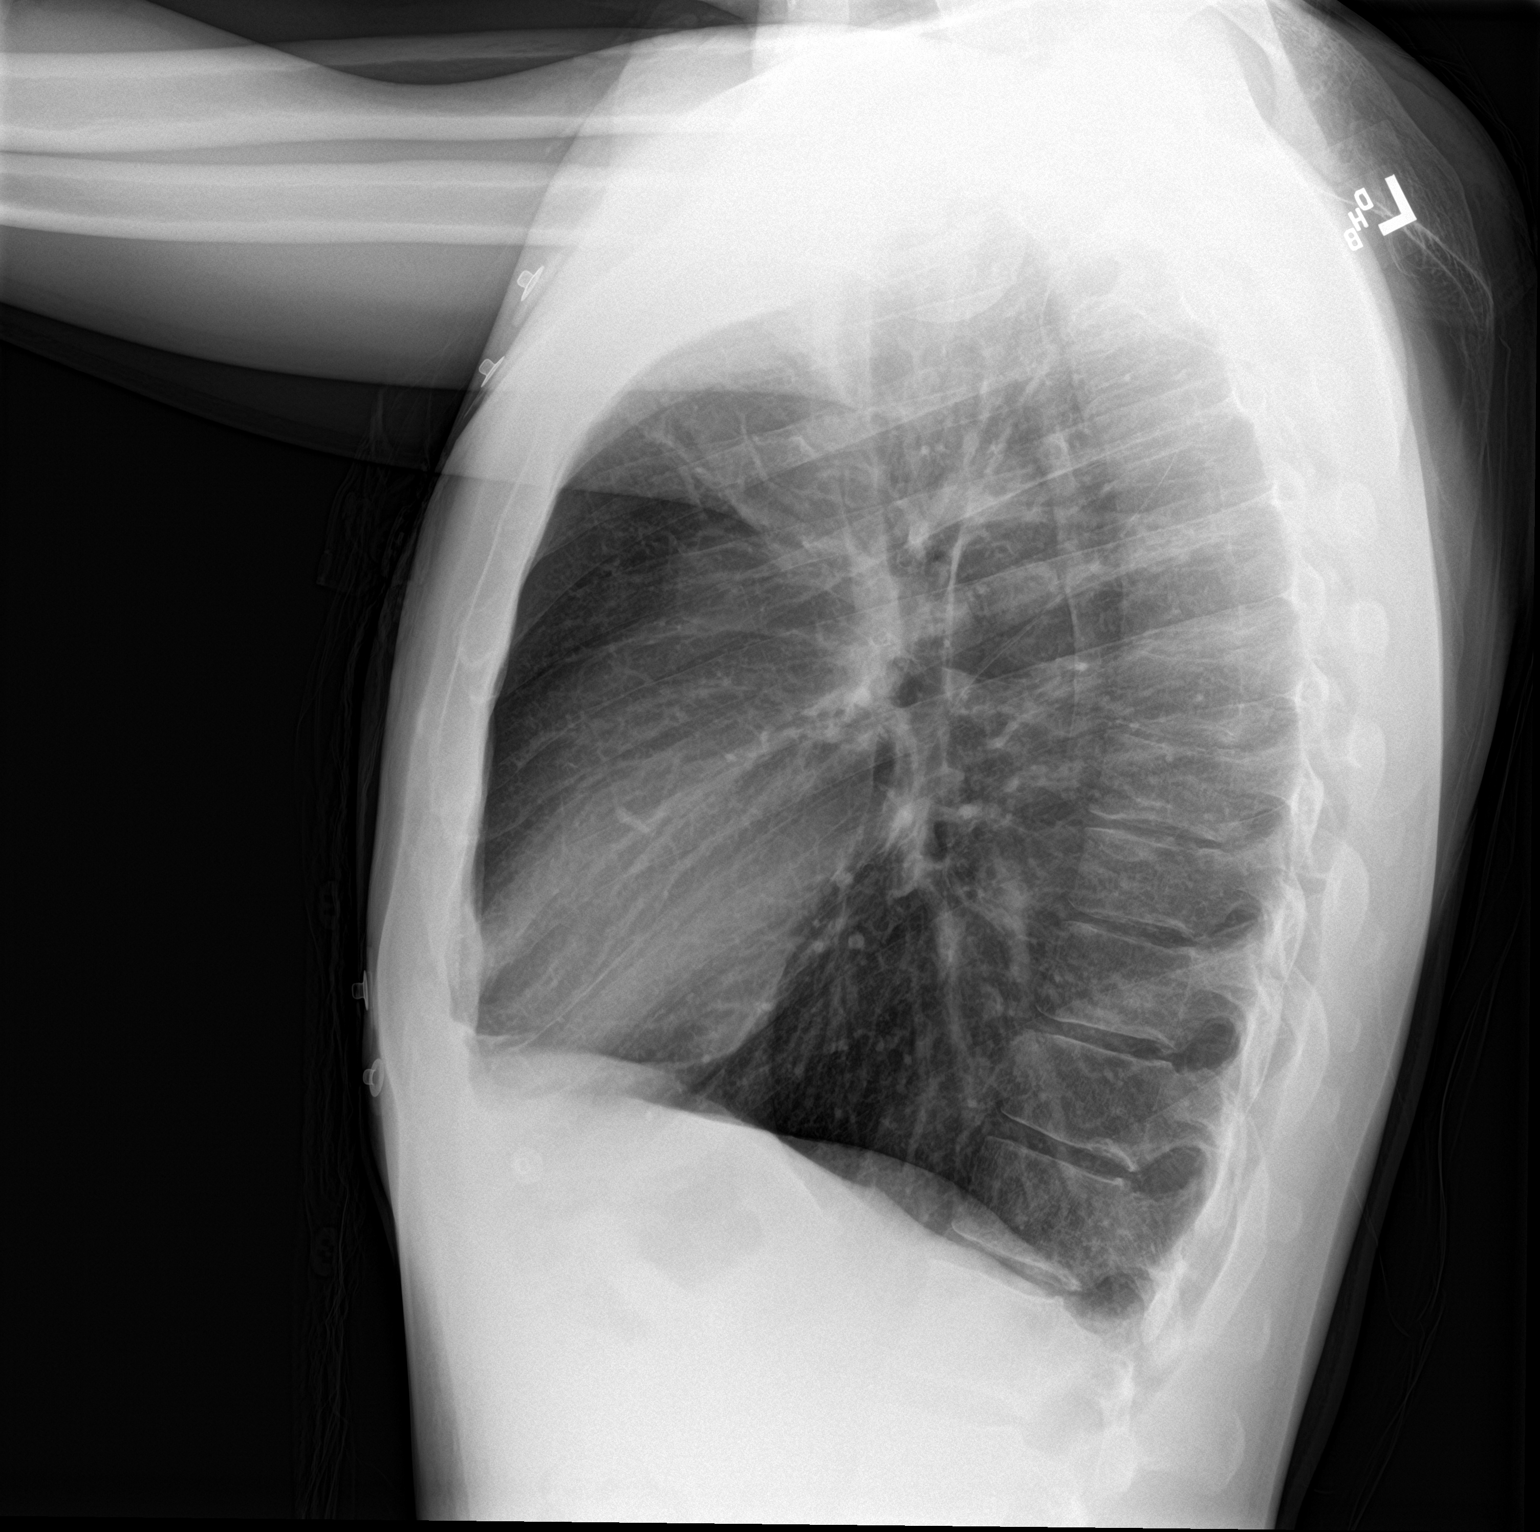

[2 of 2 positions shown; findings below may reference images not displayed]

FINDINGS: The heart size and mediastinal contours are within normal limits.
Both lungs are clear. The visualized skeletal structures are
unremarkable.
IMPRESSION: No active cardiopulmonary disease.

## 2021-05-29 MED ORDER — LORAZEPAM 1 MG PO TABS
1.0000 mg | ORAL_TABLET | Freq: Once | ORAL | Status: AC
  Administered 2021-05-29: 1 mg via ORAL
  Filled 2021-05-29: qty 1

## 2021-05-29 NOTE — ED Notes (Signed)
Pt stood up, states that he wants to leave and does not want any resources for detox at this time. Requested that pt stay until I can notify doctor and receive discharge paperwork. Denies SI/HI. Pt alert and oriented X4, cooperative, RR even and unlabored, color WNL. Pt in NAD.

## 2021-05-29 NOTE — ED Notes (Signed)
VOL, pending TTS consult

## 2021-05-29 NOTE — BH Assessment (Addendum)
This writer attempted to assess patient, patient unable to be aroused at this time.

## 2021-05-29 NOTE — ED Notes (Signed)
Using phone. 

## 2021-05-29 NOTE — Discharge Instructions (Addendum)
Please seek medical attention for any high fevers, chest pain, shortness of breath, change in behavior, persistent vomiting, bloody stool or any other new or concerning symptoms.  

## 2021-05-29 NOTE — ED Provider Notes (Signed)
[facility] Emergency Department Provider Note ____________________________________________   Event Date/Time   First MD Initiated Contact with Patient 05/28/21 2333     (approximate)  I have reviewed the triage vital signs and the nursing notes.  HISTORY  Chief Complaint Chest Pain and Addiction Problem   HPI JOHNEY PEROTTI is a 30 y.o. malewho presents to the ED for evaluation of methamphetamine abuse.  Chart review indicates no relevant history.  Patient presents to the ED voluntarily requesting assistance with methamphetamine detox.  He reports using meth regularly, often using needles, and reports he is tired of it and wants help.  He reports using as recently as this morning.  Patient further reports anxiety and concern for epigastric discomfort and chest pain.  He reports only getting pain when he gets worked up and anxious.  Denies any chest pain with exertion, syncopal episodes, shortness of breath, cough, emesis or diarrhea/stool changes. Denies any suicidal intent.   No past medical history on file.  There are no problems to display for this patient.   Past Surgical History:  Procedure Laterality Date   APPENDECTOMY     LEG SURGERY Left     Prior to Admission medications   Not on File    Allergies Penicillins  No family history on file.  Social History Social History   Tobacco Use   Smoking status: Every Day    Packs/day: 1.00    Pack years: 0.00    Types: Cigarettes   Smokeless tobacco: Never  Substance Use Topics   Alcohol use: Not Currently    Comment: daily use   Drug use: Yes    Types: Methamphetamines    Review of Systems  Constitutional: No fever/chills Eyes: No visual changes. ENT: No sore throat. Cardiovascular: Positive for chest pain. Respiratory: Denies shortness of breath. Gastrointestinal: Positive for abdominal pain.  No nausea, no vomiting.  No diarrhea.  No constipation. Genitourinary: Negative  for dysuria. Musculoskeletal: Negative for back pain. Skin: Negative for rash. Neurological: Negative for headaches, focal weakness or numbness. ____________________________________________   PHYSICAL EXAM:  VITAL SIGNS: Vitals:   05/28/21 2317 05/29/21 0246  BP: (!) 151/110 115/74  Pulse: (!) 121 89  Resp: 20 18  Temp: 98.4 F (36.9 C) 98.1 F (36.7 C)  SpO2: 100% 97%    Constitutional: Alert and oriented. In no acute distress. Eyes: Conjunctivae are normal. PERRL. EOMI. Head: Atraumatic. Nose: No congestion/rhinnorhea. Mouth/Throat: Mucous membranes are moist.  Oropharynx non-erythematous. Neck: No stridor. No cervical spine tenderness to palpation. Cardiovascular: Normal rate, regular rhythm. Grossly normal heart sounds.  Good peripheral circulation. Respiratory: Normal respiratory effort.  No retractions. Lungs CTAB. Gastrointestinal: Soft , nondistended, nontender to palpation. No CVA tenderness. Musculoskeletal: No lower extremity tenderness nor edema.  No joint effusions. No signs of acute trauma. Neurologic:  Normal speech and language. No gross focal neurologic deficits are appreciated. No gait instability noted. Skin:  Skin is warm, dry and intact. No rash noted. Psychiatric: Mood and affect are normal. Speech and behavior are normal. ____________________________________________   LABS (all labs ordered are listed, but only abnormal results are displayed)  Labs Reviewed  COMPREHENSIVE METABOLIC PANEL - Abnormal; Notable for the following components:      Result Value   Glucose, Bld 140 (*)    Total Protein 8.5 (*)    AST 124 (*)    ALT 291 (*)    Total Bilirubin 1.5 (*)    All other components within normal limits  CBC  ETHANOL  URINALYSIS, COMPLETE (UACMP) WITH MICROSCOPIC  URINE DRUG SCREEN, QUALITATIVE ([facility] ONLY)  TROPONIN I (HIGH SENSITIVITY)  TROPONIN I (HIGH SENSITIVITY)   ____________________________________________  12 Lead EKG  Sinus  rhythm, rate of 102 bpm.  Normal axis and intervals.  No evidence of acute ischemia. ____________________________________________  RADIOLOGY  ED MD interpretation: 2 view CXR reviewed by me without evidence of acute cardiopulmonary pathology.  Official radiology report(s): DG Chest 2 View  Result Date: 05/29/2021 CLINICAL DATA:  Chest pain EXAM: CHEST - 2 VIEW COMPARISON:  02/04/2020 FINDINGS: The heart size and mediastinal contours are within normal limits. Both lungs are clear. The visualized skeletal structures are unremarkable. IMPRESSION: No active cardiopulmonary disease. Electronically Signed   By: Deatra Robinson M.D.   On: 05/29/2021 00:38   US ABDOMEN LIMITED RUQ (LIVER/GB)  Result Date: 05/29/2021 CLINICAL DATA:  Epigastric and right upper quadrant pain tonight. Elevated liver function studies. EXAM: ULTRASOUND ABDOMEN LIMITED RIGHT UPPER QUADRANT COMPARISON:  02/04/2020 FINDINGS: Gallbladder: No gallstones or wall thickening visualized. No sonographic Murphy sign noted by sonographer. Common bile duct: Diameter: 4 mm, normal Liver: No focal lesion identified. Within normal limits in parenchymal echogenicity. Portal vein is patent on color Doppler imaging with normal direction of blood flow towards the liver. Other: None. IMPRESSION: Normal examination. No evidence of cholelithiasis or acute cholecystitis. Electronically Signed   By: Burman Nieves M.D.   On: 05/29/2021 01:33    ____________________________________________   PROCEDURES and INTERVENTIONS  Procedure(s) performed (including Critical Care):  Procedures  Medications  LORazepam (ATIVAN) tablet 1 mg (1 mg Oral Given 05/29/21 0024)    ____________________________________________   MDM / ED COURSE   30 year old male presents to the ED requesting detox and assistance with his methamphetamine abuse.  Denies any suicidality or acute psychiatric concerns.  Does report intermittent chest pains and abdominal pain for many  weeks in the setting of his anxiety.  No evidence of ACS, biliary pathology or additional acute derangements to cause these pains.  No evidence of medical pathology to preclude TTS evaluation to assist with detox for his methamphetamine abuse.  No indications for IVC or emergent psychiatric evaluation at this time.  Clinical Course as of 05/29/21 0300  Thu May 29, 2021  0200 The patient has been placed in psychiatric observation due to the need to provide a safe environment for the patient while obtaining psychiatric consultation and evaluation, as well as ongoing medical and medication management to treat the patient's condition.  The patient has not been placed under full IVC at this time.   [DS]    Clinical Course User Index [DS] Delton Prairie, MD    ____________________________________________   FINAL CLINICAL IMPRESSION(S) / ED DIAGNOSES  Final diagnoses:  RUQ abdominal pain  Methamphetamine abuse [facility])     ED Discharge Orders     None        [place]   Note:  This document was prepared using Dragon voice recognition software and may include unintentional dictation errors.    Delton Prairie, MD 05/29/21 [phone]

## 2021-05-29 NOTE — ED Notes (Signed)
Discharged to lobby. Pt does not want to stay for repeat VS or to get e signature. Verified correct patient and correct discharge papers given. Pt alert and oriented X 4, stable for discharge. RR even and unlabored, color WNL. Discussed discharge instructions and follow-up as directed. Discharge medications discussed, when prescribed. Pt had opportunity to ask questions, and RN available to provide patient and/or family education.

## 2021-06-02 ENCOUNTER — Emergency Department
Admission: EM | Admit: 2021-06-02 | Discharge: 2021-06-03 | Disposition: A | Discharge status: Home / Self Care | Payer: Self-pay | Attending: Emergency Medicine | Admitting: Emergency Medicine

## 2021-06-02 ENCOUNTER — Other Ambulatory Visit: Payer: Self-pay

## 2021-06-02 DIAGNOSIS — F15929 Other stimulant use, unspecified with intoxication, unspecified: Secondary | ICD-10-CM

## 2021-06-02 DIAGNOSIS — F17210 Nicotine dependence, cigarettes, uncomplicated: Secondary | ICD-10-CM | POA: Insufficient documentation

## 2021-06-02 DIAGNOSIS — F15920 Other stimulant use, unspecified with intoxication, uncomplicated: Secondary | ICD-10-CM | POA: Insufficient documentation

## 2021-06-02 LAB — CBC
HCT: 43.8 % (ref 39.0–52.0)
Hemoglobin: 15.2 g/dL (ref 13.0–17.0)
MCH: 30.2 pg (ref 26.0–34.0)
MCHC: 34.7 g/dL (ref 30.0–36.0)
MCV: 86.9 fL (ref 80.0–100.0)
Platelets: 349 10*3/uL (ref 150–400)
RBC: 5.04 MIL/uL (ref 4.22–5.81)
RDW: 12.9 % (ref 11.5–15.5)
WBC: 17 10*3/uL — ABNORMAL HIGH (ref 4.0–10.5)
nRBC: 0 % (ref 0.0–0.2)

## 2021-06-02 LAB — COMPREHENSIVE METABOLIC PANEL
ALT: 149 U/L — ABNORMAL HIGH (ref 0–44)
AST: 58 U/L — ABNORMAL HIGH (ref 15–41)
Albumin: 4.9 g/dL (ref 3.5–5.0)
Alkaline Phosphatase: 46 U/L (ref 38–126)
Anion gap: 9 (ref 5–15)
BUN: 18 mg/dL (ref 6–20)
CO2: 26 mmol/L (ref 22–32)
Calcium: 10.5 mg/dL — ABNORMAL HIGH (ref 8.9–10.3)
Chloride: 106 mmol/L (ref 98–111)
Creatinine, Ser: 1.13 mg/dL (ref 0.61–1.24)
GFR, Estimated: >60 mL/min (ref >60)
Glucose, Bld: 91 mg/dL (ref 70–99)
Potassium: 4.9 mmol/L (ref 3.5–5.1)
Sodium: 141 mmol/L (ref 135–145)
Total Bilirubin: 1.9 mg/dL — ABNORMAL HIGH (ref 0.3–1.2)
Total Protein: 8.9 g/dL — ABNORMAL HIGH (ref 6.5–8.1)

## 2021-06-02 LAB — ETHANOL: Alcohol, Ethyl (B): <10 mg/dL (ref <10)
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
## 2021-06-03 NOTE — ED Provider Notes (Signed)
  Patient received in signout from Dr. Scotty Court pending awakening and clinical sobriety.  Briefly, he was brought to the ED due to bizarre behavior earlier today.  He sleeps for the majority of my shift without distress.  By 6 AM, he is ambulatory with a steady gait, tolerating p.o. intake and staying awake.  Maintains normal vital signs and I see no barriers to discharge to law enforcement custody.  Return precautions for the ED were discussed.   Delton Prairie, MD 06/03/21 [phone]

## 2021-06-03 NOTE — ED Notes (Signed)
Pt ambulated with assistance until pt able to ambulate with steady gait. Pt walks alone with officer at side back to chair. Pt refusing to speak but nodding head with yes and no responses. PO fluids given to pt before discharge by request.   Discharge paperwork provided to officer and pt taken out of ED under custody in forensic hand restraints.   No e-sign obtained

## 2021-06-29 ENCOUNTER — Emergency Department: Payer: Self-pay

## 2021-06-29 ENCOUNTER — Other Ambulatory Visit: Payer: Self-pay

## 2021-06-29 ENCOUNTER — Emergency Department
Admission: EM | Admit: 2021-06-29 | Discharge: 2021-06-30 | Disposition: A | Discharge status: Home / Self Care | Payer: Self-pay | Attending: Student in an Organized Health Care Education/Training Program | Admitting: Student in an Organized Health Care Education/Training Program

## 2021-06-29 DIAGNOSIS — R410 Disorientation, unspecified: Secondary | ICD-10-CM | POA: Insufficient documentation

## 2021-06-29 DIAGNOSIS — Y900 Blood alcohol level of less than 20 mg/100 ml: Secondary | ICD-10-CM | POA: Insufficient documentation

## 2021-06-29 DIAGNOSIS — F1510 Other stimulant abuse, uncomplicated: Secondary | ICD-10-CM

## 2021-06-29 DIAGNOSIS — F17210 Nicotine dependence, cigarettes, uncomplicated: Secondary | ICD-10-CM | POA: Insufficient documentation

## 2021-06-29 DIAGNOSIS — R4182 Altered mental status, unspecified: Secondary | ICD-10-CM | POA: Insufficient documentation

## 2021-06-29 DIAGNOSIS — F19959 Other psychoactive substance use, unspecified with psychoactive substance-induced psychotic disorder, unspecified: Secondary | ICD-10-CM

## 2021-06-29 DIAGNOSIS — R451 Restlessness and agitation: Secondary | ICD-10-CM | POA: Insufficient documentation

## 2021-06-29 DIAGNOSIS — F1910 Other psychoactive substance abuse, uncomplicated: Secondary | ICD-10-CM

## 2021-06-29 DIAGNOSIS — Z20822 Contact with and (suspected) exposure to covid-19: Secondary | ICD-10-CM | POA: Insufficient documentation

## 2021-06-29 LAB — COMPREHENSIVE METABOLIC PANEL
ALT: 176 U/L — ABNORMAL HIGH (ref 0–44)
AST: 69 U/L — ABNORMAL HIGH (ref 15–41)
Albumin: 4.1 g/dL (ref 3.5–5.0)
Alkaline Phosphatase: 33 U/L — ABNORMAL LOW (ref 38–126)
Anion gap: 9 (ref 5–15)
BUN: 31 mg/dL — ABNORMAL HIGH (ref 6–20)
CO2: 25 mmol/L (ref 22–32)
Calcium: 9.5 mg/dL (ref 8.9–10.3)
Chloride: 105 mmol/L (ref 98–111)
Creatinine, Ser: 1.04 mg/dL (ref 0.61–1.24)
GFR, Estimated: >60 mL/min (ref >60)
Glucose, Bld: 86 mg/dL (ref 70–99)
Potassium: 4.6 mmol/L (ref 3.5–5.1)
Sodium: 139 mmol/L (ref 135–145)
Total Bilirubin: 2.8 mg/dL — ABNORMAL HIGH (ref 0.3–1.2)
Total Protein: 7.4 g/dL (ref 6.5–8.1)

## 2021-06-29 LAB — ETHANOL: Alcohol, Ethyl (B): <10 mg/dL (ref <10)

## 2021-06-29 LAB — CBC
HCT: 40.6 % (ref 39.0–52.0)
Hemoglobin: 13.8 g/dL (ref 13.0–17.0)
MCH: 30 pg (ref 26.0–34.0)
MCHC: 34 g/dL (ref 30.0–36.0)
MCV: 88.3 fL (ref 80.0–100.0)
Platelets: 261 10*3/uL (ref 150–400)
RBC: 4.6 MIL/uL (ref 4.22–5.81)
RDW: 13.6 % (ref 11.5–15.5)
WBC: 9.8 10*3/uL (ref 4.0–10.5)
nRBC: 0 % (ref 0.0–0.2)

## 2021-06-29 LAB — RESP PANEL BY RT-PCR (FLU A&B, COVID) ARPGX2
Influenza A by PCR: NEGATIVE
Influenza B by PCR: NEGATIVE
SARS Coronavirus 2 by RT PCR: NEGATIVE

## 2021-06-29 LAB — ACETAMINOPHEN LEVEL: Acetaminophen (Tylenol), Serum: <10 ug/mL — ABNORMAL LOW (ref 10–30)

## 2021-06-29 IMAGING — CT CT CERVICAL SPINE W/O CM
3 of 4 series · 13 of 33 positions shown, 16 images · non-contrast
Comparison: CT [DATE]

CLINICAL DATA: Mental status change

EXAM:
CT HEAD WITHOUT CONTRAST
CT CERVICAL SPINE WITHOUT CONTRAST
TECHNIQUE: Multidetector CT imaging of the head and cervical spine was
performed following the standard protocol without intravenous
contrast. Multiplanar CT image reconstructions of the cervical spine
were also generated.

[Series 4: sagittal bone · sagittal · 0.41mm/px · 5 of 61 slices shown, 6 images]
[im 21/61  bone]
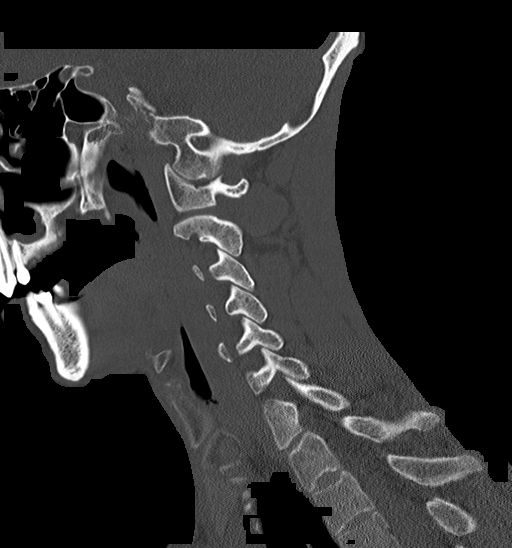
[im 26/61  bone]
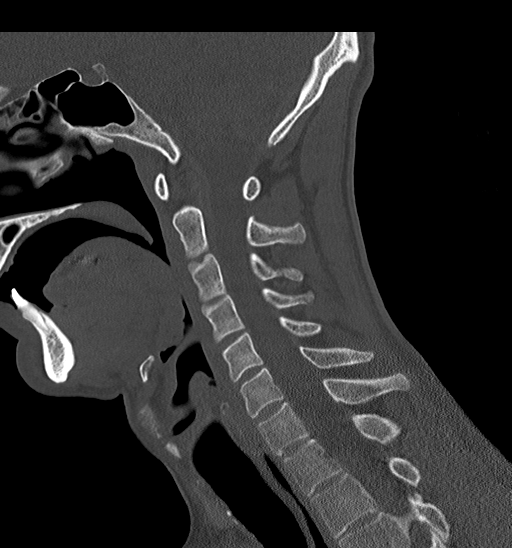
[im 31/61  soft-tissue]
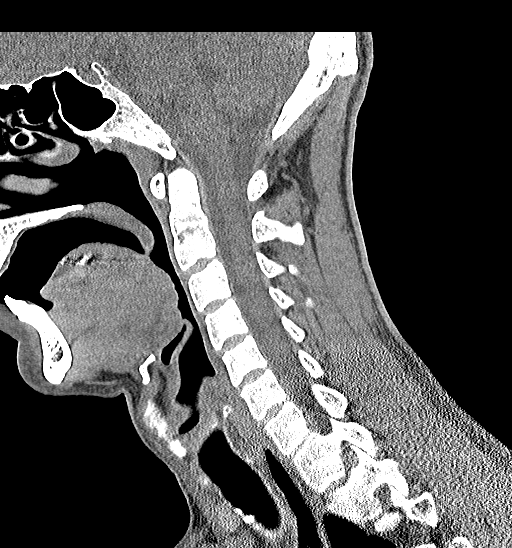
[im 31/61  bone]
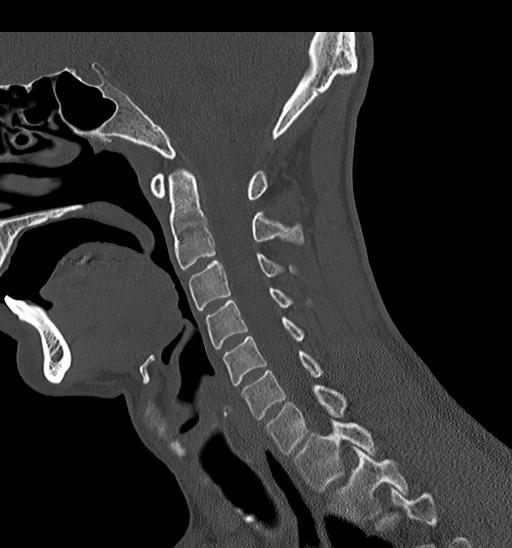
[im 36/61  bone]
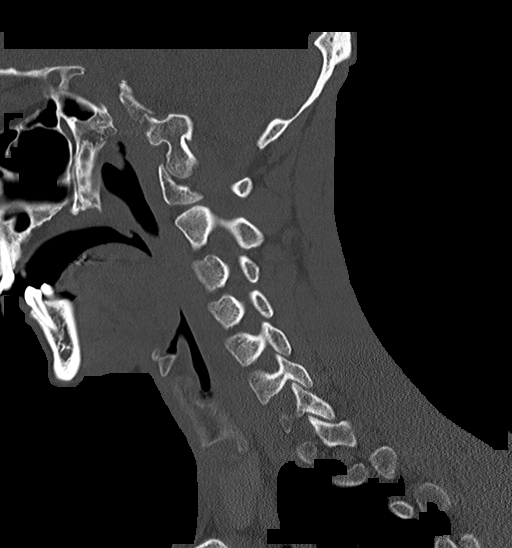
[im 41/61  bone]
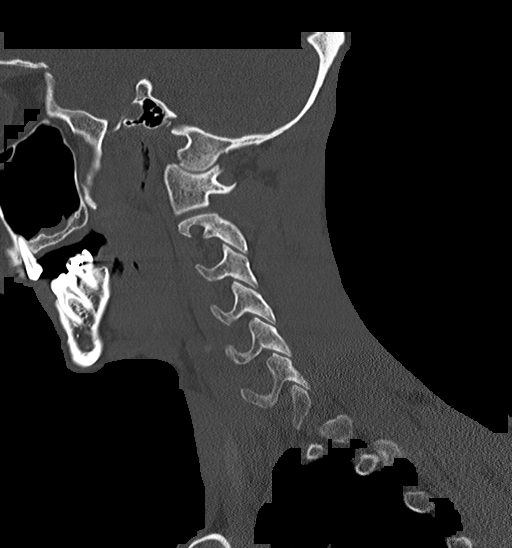

[Series 5: coronal bone · coronal · 0.31mm/px · 3 of 72 slices shown]
[im 18/72  bone]
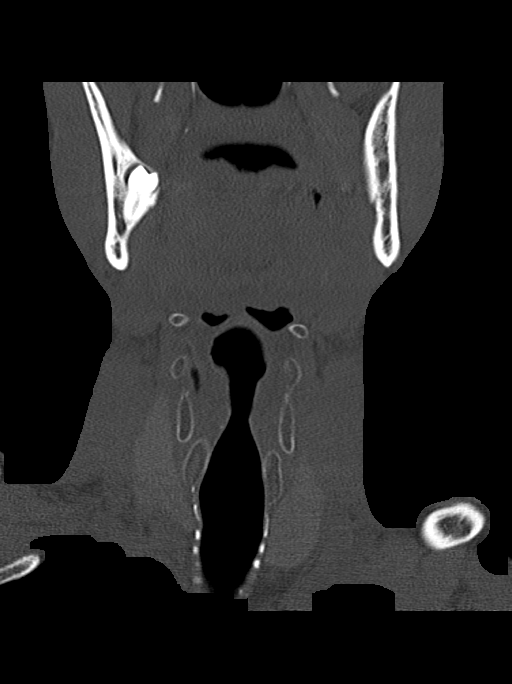
[im 30/72  bone]
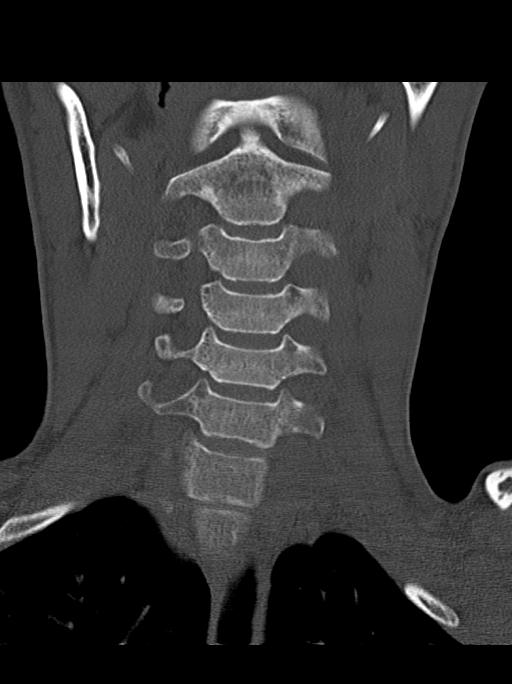
[im 42/72  bone]
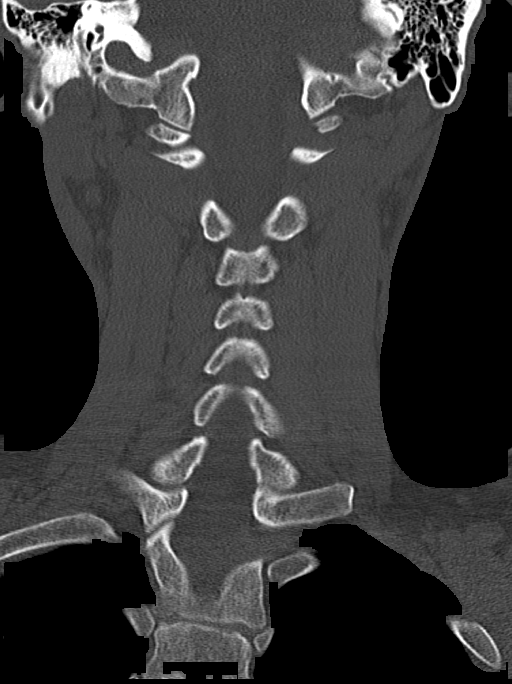

[Series 6: orthogonal bone · axial · 0.28mm/px · z∈[-291,-124]mm · 5 of 133 slices shown, 7 images]
[im 19/133  soft-tissue]
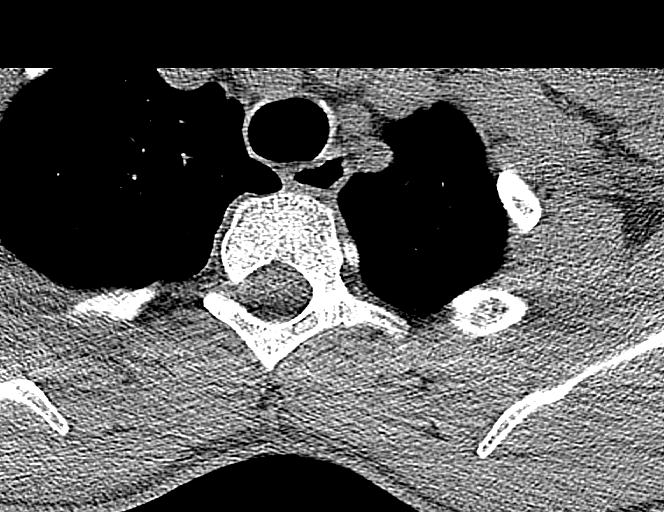
[im 19/133  bone]
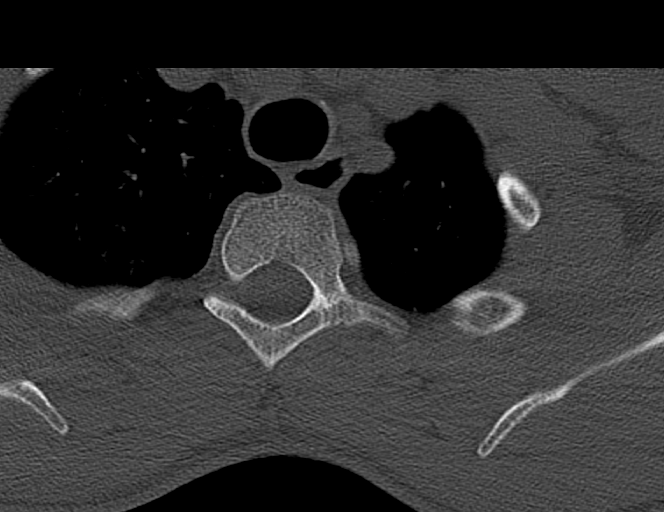
[im 38/133  bone]
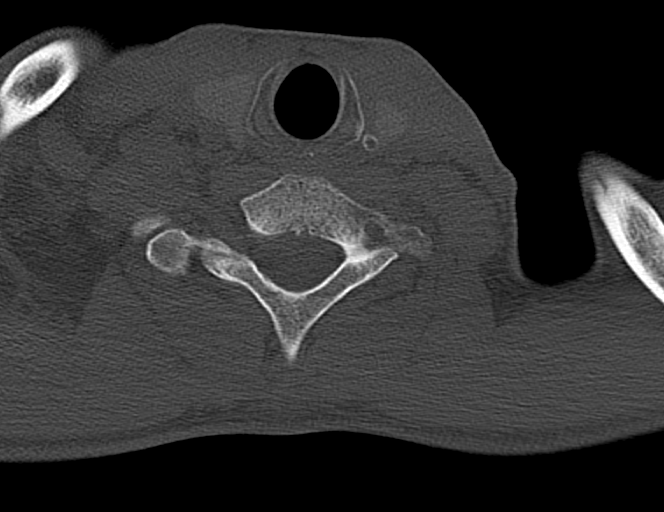
[im 76/133  bone]
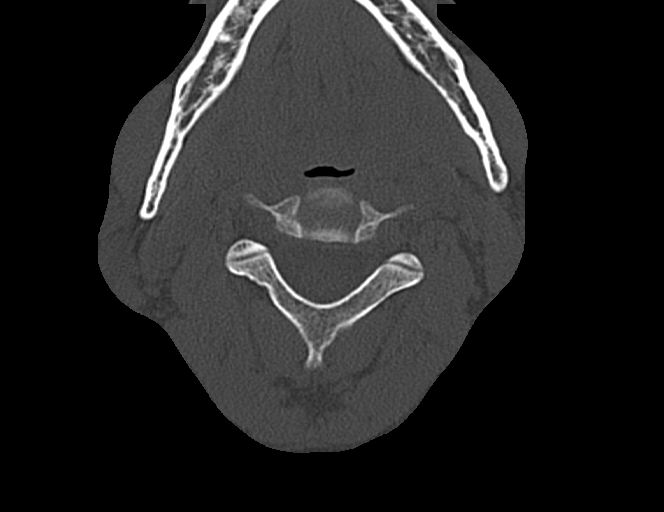
[im 95/133  bone]
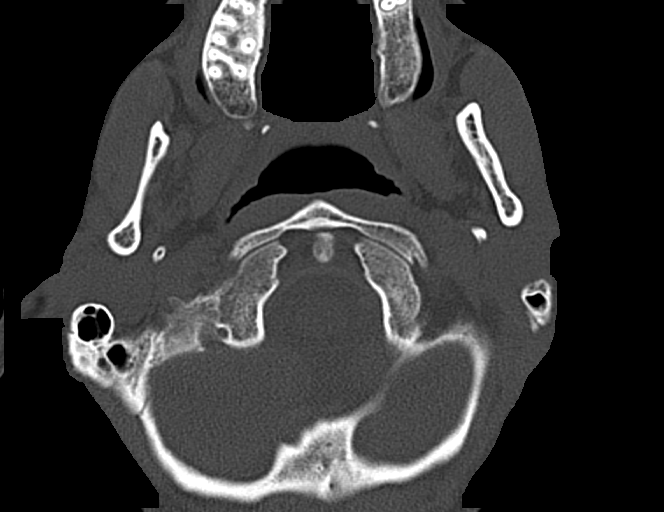
[im 114/133  soft-tissue]
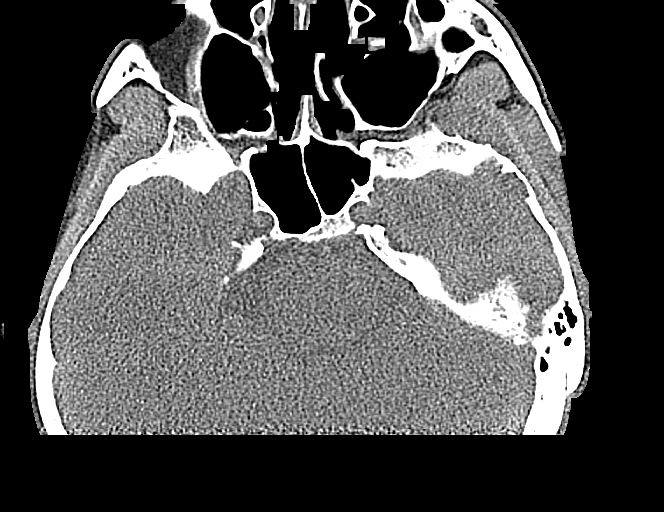
[im 114/133  bone]
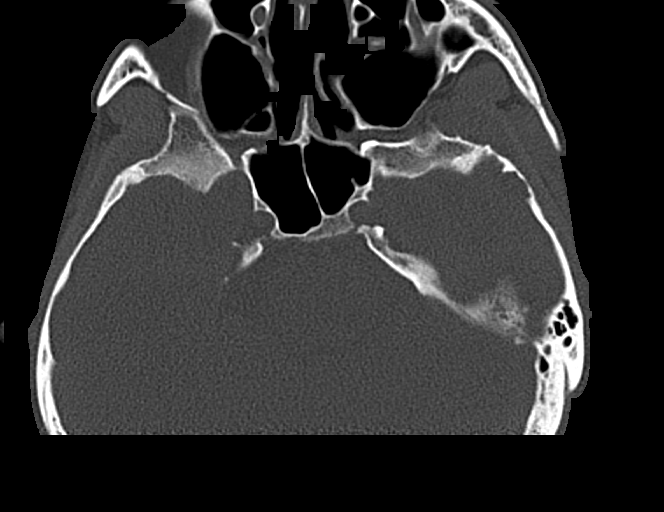

[13 of 33 positions shown; findings below may reference images not displayed]

FINDINGS: CT HEAD FINDINGS

Brain: No acute territorial infarction, hemorrhage or intracranial
mass. The ventricles are nonenlarged.

Vascular: No hyperdense vessel or unexpected calcification.

Skull: Normal. Negative for fracture or focal lesion.

Sinuses/Orbits: Old fracture deformity medial wall left orbit

Other: None

CT CERVICAL SPINE FINDINGS

Alignment: Normal.

Skull base and vertebrae: No acute fracture. No primary bone lesion
or focal pathologic process.

Soft tissues and spinal canal: No prevertebral fluid or swelling. No
visible canal hematoma.

Disc levels:  Within normal limits

Upper chest: Negative.

Other: None
IMPRESSION: 1. Negative non contrasted CT appearance of the brain.
2. No acute osseous abnormality of the cervical spine

## 2021-06-29 MED ORDER — MIDAZOLAM HCL 2 MG/2ML IJ SOLN
4.0000 mg | Freq: Once | INTRAMUSCULAR | Status: AC
  Administered 2021-06-29: 4 mg via INTRAMUSCULAR
  Filled 2021-06-29: qty 4

## 2021-06-29 MED ORDER — ZIPRASIDONE MESYLATE 20 MG IM SOLR
20.0000 mg | Freq: Once | INTRAMUSCULAR | Status: AC
  Administered 2021-06-29: 20 mg via INTRAMUSCULAR
  Filled 2021-06-29: qty 20

## 2021-06-29 NOTE — ED Notes (Signed)
IVC pending consult when awake

## 2021-06-29 NOTE — ED Notes (Signed)
Pt cannot be triaged properly at this time.  Pt is growling and unable to answer questions.   Im medications given to calm patient to allow for CT scan and lab draw.    Pt is NOT dressed in scrubs at this time. Charge nurse and EDP made aware.

## 2021-06-29 NOTE — ED Provider Notes (Signed)
[facility] Emergency Department Provider Note    Event Date/Time   First MD Initiated Contact with Patient 06/29/21 1815     (approximate)  I have reviewed the triage vital signs and the nursing notes.   HISTORY  Chief Complaint Psychiatric Evaluation  Level V Caveat:  acute agitation, intoxication  HPI Danny Turner is a 30 y.o. male with a history of polysubstance abuse and methamphetamine dependence presents to the ER for evaluation of altered mental status patient was reportedly found crawling to the bar stating that he was aware wall of with altered mental status was growling when police brought him into custody.  He has been in the fetal position since in police custody uncooperative with with staff  No past medical history on file. No family history on file. Past Surgical History:  Procedure Laterality Date   APPENDECTOMY     LEG SURGERY Left    There are no problems to display for this patient.     Prior to Admission medications   Not on File    Allergies Penicillins    Social History Social History   Tobacco Use   Smoking status: Every Day    Packs/day: 1.00    Types: Cigarettes   Smokeless tobacco: Never  Substance Use Topics   Alcohol use: Not Currently    Comment: daily use   Drug use: Yes    Types: Methamphetamines    Review of Systems Patient denies headaches, rhinorrhea, blurry vision, numbness, shortness of breath, chest pain, edema, cough, abdominal pain, nausea, vomiting, diarrhea, dysuria, fevers, rashes or hallucinations unless otherwise stated above in HPI. ____________________________________________   PHYSICAL EXAM:  VITAL SIGNS: Vitals:   06/29/21 1837 06/29/21 2206  BP: 125/78   Pulse: 95   Resp: 20   Temp:  97.8 F (36.6 C)    Constitutional: Acutely agitated intermittently growling.  Not following commands or cooperating with exam.  Eyes: Conjunctivae are normal.  Head:  Atraumatic. Nose: No congestion/rhinnorhea. Mouth/Throat: Mucous membranes are moist.   Neck: No stridor. Painless ROM.  Cardiovascular: tachycardic, well perfused Respiratory: Normal respiratory effort.  No retractions. Lungs CTAB. Gastrointestinal: Soft and nontender. No distention. No abdominal bruits. No CVA tenderness. Genitourinary: deferred Musculoskeletal: No lower extremity tenderness nor edema.  No joint effusions. Neurologic: uncooperative with exam, appearing to be moving all extremities without weakness Skin:  Skin is warm, dry and intact. No rash noted. Psychiatric: acutely agitated and appears to be under the effects of stimulant drug  ____________________________________________   LABS (all labs ordered are listed, but only abnormal results are displayed)  Results for orders placed or performed during the hospital encounter of 06/29/21 (from the past 24 hour(s))  CBC     Status: None   Collection Time: 06/29/21  9:50 PM  Result Value Ref Range   WBC 9.8 4.0 - 10.5 K/uL   RBC 4.60 4.22 - 5.81 MIL/uL   Hemoglobin 13.8 13.0 - 17.0 g/dL   HCT 09.3 26.7 - 12.4 %   MCV 88.3 80.0 - 100.0 fL   MCH 30.0 26.0 - 34.0 pg   MCHC 34.0 30.0 - 36.0 g/dL   RDW 58.0 99.8 - 33.8 %   Platelets 261 150 - 400 K/uL   nRBC 0.0 0.0 - 0.2 %  Resp Panel by RT-PCR (Flu A&B, Covid) Nasopharyngeal Swab     Status: None   Collection Time: 06/29/21  9:50 PM   Specimen: Nasopharyngeal Swab; Nasopharyngeal(NP) swabs in vial transport  medium  Result Value Ref Range   SARS Coronavirus 2 by RT PCR NEGATIVE NEGATIVE   Influenza A by PCR NEGATIVE NEGATIVE   Influenza B by PCR NEGATIVE NEGATIVE  Ethanol     Status: None   Collection Time: 06/29/21  9:50 PM  Result Value Ref Range   Alcohol, Ethyl (B) <10 <10 mg/dL  Acetaminophen level     Status: Abnormal   Collection Time: 06/29/21  9:50 PM  Result Value Ref Range   Acetaminophen (Tylenol), Serum <10 (L) 10 - 30 ug/mL  Comprehensive metabolic  panel     Status: Abnormal   Collection Time: 06/29/21 10:35 PM  Result Value Ref Range   Sodium 139 135 - 145 mmol/L   Potassium 4.6 3.5 - 5.1 mmol/L   Chloride 105 98 - 111 mmol/L   CO2 25 22 - 32 mmol/L   Glucose, Bld 86 70 - 99 mg/dL   BUN 31 (H) 6 - 20 mg/dL   Creatinine, Ser 0.25 0.61 - 1.24 mg/dL   Calcium 9.5 8.9 - 85.2 mg/dL   Total Protein 7.4 6.5 - 8.1 g/dL   Albumin 4.1 3.5 - 5.0 g/dL   AST 69 (H) 15 - 41 U/L   ALT 176 (H) 0 - 44 U/L   Alkaline Phosphatase 33 (L) 38 - 126 U/L   Total Bilirubin 2.8 (H) 0.3 - 1.2 mg/dL   GFR, Estimated >77 >82 mL/min   Anion gap 9 5 - 15   ____________________________________________  EKG My review and personal interpretation at Time: 19:27   Indication: ams  Rate: 80  Rhythm: sinus Axis: normal Other: normal intervals, no stemi or depressions ____________________________________________  RADIOLOGY I personally reviewed all radiographic images ordered to evaluate for the above acute complaints and reviewed radiology reports and findings.  These findings were personally discussed with the patient.  Please see medical record for radiology report.  ____________________________________________   PROCEDURES  Procedure(s) performed:  .Critical Care  Date/Time: 06/29/2021 11:12 PM Performed by: Willy Eddy, MD Authorized by: Willy Eddy, MD   Critical care provider statement:    Critical care time (minutes):  35   Critical care was necessary to treat or prevent imminent or life-threatening deterioration of the following conditions:  Toxidrome and CNS failure or compromise   Critical care was time spent personally by me on the following activities:  Discussions with consultants, evaluation of patient's response to treatment, examination of patient, ordering and performing treatments and interventions, ordering and review of laboratory studies, ordering and review of radiographic studies, pulse oximetry, re-evaluation of  patient's condition, obtaining history from patient or surrogate and review of old charts    Critical Care performed: yes ____________________________________________   INITIAL IMPRESSION / ASSESSMENT AND PLAN / ED COURSE  Pertinent labs & imaging results that were available during my care of the patient were reviewed by me and considered in my medical decision making (see chart for details).   DDX: Psychosis, delirium, medication effect, noncompliance, polysubstance abuse, Si, Hi, depression   CULLAN LAUNER is a 30 y.o. who presents to the ED with acute agitated delirium which is most likely secondary to methamphetamine or polysubstance abuse based on his history and presentation.  Patient very disorganized appears volatile will give IM calming medication as he is uncooperative with staff given the report of crawling to a bar do feel that patient will require medical work-up and imaging to evaluate for any evidence of possible traumatic injury or metabolic derangement. The patient  will be placed on continuous pulse oximetry and telemetry for monitoring.  Laboratory evaluation will be sent to evaluate for the above complaints.     Clinical Course as of 06/29/21 2314  Wynelle Link Jun 29, 2021  2310 Patient resting comfortably.  Remains hemodynamically stable after I am calling medication.  Will be allowed to metabolize overnight for reassessment. [PR]    Clinical Course User Index [PR] Willy Eddy, MD    The patient was evaluated in Emergency Department today for the symptoms described in the history of present illness. He/she was evaluated in the context of the global COVID-19 pandemic, which necessitated consideration that the patient might be at risk for infection with the SARS-CoV-2 virus that causes COVID-19. Institutional protocols and algorithms that pertain to the evaluation of patients at risk for COVID-19 are in a state of rapid change based on information released by regulatory  bodies including the CDC and federal and state organizations. These policies and algorithms were followed during the patient's care in the ED.  As part of my medical decision making, I reviewed the following data within the electronic MEDICAL RECORD NUMBER Nursing notes reviewed and incorporated, Labs reviewed, notes from prior ED visits and [place] Controlled Substance Database   ____________________________________________   FINAL CLINICAL IMPRESSION(S) / ED DIAGNOSES  Final diagnoses:  Polysubstance abuse (HCC)      NEW MEDICATIONS STARTED DURING THIS VISIT:  New Prescriptions   No medications on file     Note:  This document was prepared using Dragon voice recognition software and may include unintentional dictation errors.    Willy Eddy, MD 06/29/21 [phone]

## 2021-06-29 NOTE — ED Triage Notes (Signed)
Pt brought in by EMS after crawling to a bar.  Pt presents in the fetal position growling.

## 2021-06-29 NOTE — ED Notes (Signed)
Pt sleeping with even, unlabored respirations.

## 2021-06-30 DIAGNOSIS — F19959 Other psychoactive substance use, unspecified with psychoactive substance-induced psychotic disorder, unspecified: Secondary | ICD-10-CM

## 2021-06-30 DIAGNOSIS — F1510 Other stimulant abuse, uncomplicated: Secondary | ICD-10-CM

## 2021-06-30 NOTE — ED Notes (Signed)
Gave breakfast tray with juice. 

## 2021-06-30 NOTE — ED Notes (Signed)
Dr. Clapacs and TTS at bedside. 

## 2021-06-30 NOTE — ED Notes (Signed)
VOL/pending discharge 

## 2021-06-30 NOTE — ED Notes (Signed)
Pt up and walking steady, took shower and changed scrubs.

## 2021-06-30 NOTE — ED Provider Notes (Addendum)
On my assessment patient patient is awake but refusing to speak.  He is refusing to elaborate on events leading to him, and emergency room.  He does nod in agreement when asked if there is anything going on refuses to elaborate.  Per Dr. Toni Amend he saw patient and recommends admission.  Will be for patient to be admitted.  Before getting admitted to inpatient service patient began talking again and had a conversation with Dr. Cliffton Asters back.  Dr. Toni Amend is cleared patient for discharge.  Will recommend [facility] follow-up.  Discharged stable condition.   Gilles Chiquito, MD 06/30/21 1131    Gilles Chiquito, MD 06/30/21 [phone]

## 2021-06-30 NOTE — ED Notes (Signed)
Pt given belongings at this time. Pt is ready d/c pt given and provided with [facility] resources, pt given paper scrubs at this time. Pt stormed out the room walking towards the EMS bay, explained that he would not able to leave through that door. Pt also still had burgundy pants on, explained to pt he would not be able to leave with those pants, pt stormed out towards the lobby and states, "im not a patient here you better not touch me." Security notified.   Unable to obtain d/c signature, unable to obtain d/c vitals.

## 2021-06-30 NOTE — ED Notes (Signed)
Pt given lunch tray.

## 2021-06-30 NOTE — ED Notes (Signed)
Dr. Toni Amend and Dr. Katrinka Blazing, EDP at bedside at this time.

## 2021-06-30 NOTE — Progress Notes (Signed)
   06/30/21 1400  Clinical Encounter Type  Visited With Patient  Visit Type Initial;Spiritual support;Social support  Referral From Other (Comment) (rounding)  Spiritual Encounters  Spiritual Needs Other (Comment) (not specified)  Chaplain Burris encountered Pt for first time since his arrival in ED. Chaplain greeted Pt and let him know that pastoral support is available should he desire. Visit seemed to be appreciated but conversation not initiated at this time.

## 2021-06-30 NOTE — ED Notes (Signed)
Pt requesting to leave at this time. Dr. Toni Amend at bedside.

## 2021-06-30 NOTE — ED Notes (Signed)
IVC/pending dc when sober and ivc is rescinded.

## 2021-06-30 NOTE — ED Notes (Signed)
This RN at bedside at this time. Pt is still very drowsy, able to open eyes but won't continue a conversation with this RN, pt went right back to sleep.

## 2021-06-30 NOTE — ED Provider Notes (Signed)
-----------------------------------------   6:02 AM on 06/30/2021 ----------------------------------------- Patient more awake.  No longer showing signs of psychosis.  Highly suspect substance-induced psychosis which is now wearing off as the patient is metabolizing.  We will discontinue the patient's IVC.  Once the patient is awake and able to call for a ride we will discharge home.   Minna Antis, MD 06/30/21 [phone]

## 2021-06-30 NOTE — Discharge Instructions (Addendum)
You have been seen in the emergency department for a  psychiatric concern. Please follow-up with your outpatient resources provided. Return to the emergency department for any worsening symptoms, or any thoughts of hurting yourself or anyone else so that we may attempt to help you. 

## 2021-06-30 NOTE — Consult Note (Signed)
[facility] Face-to-Face Psychiatry Consult   Reason for Consult: Consult for 30 year old man came to the emergency room very disorganized last night Referring Physician: Katrinka Blazing Patient Identification: Danny Turner MRN:  [id number] Principal Diagnosis: Psychoactive substance-induced psychosis (HCC) Diagnosis:  Principal Problem:   Psychoactive substance-induced psychosis (HCC)   Total Time spent with patient: 45 minutes  Subjective:   Danny Turner is a 30 y.o. male patient admitted with patient not able to communicate.  HPI: Patient seen chart reviewed.  30 year old man with a past history of amphetamine abuse.  Came to the emergency room last night reportedly brought in by police who found him unresponsive acting bizarre in public yesterday afternoon.  Patient was withdrawn and not communicative with work-up yesterday or at least not with giving any history.  Came to see him this morning with TTS and found him wrapped up in a sheet.  He would not respond verbally or even make eye contact.  About a half an hour later went back in the room with the ER physician.  At that time the patient did make some eye contact pulled his head out from under the sheet but still did not speak.  He did shake his head no that he was not feeling good and nodded slightly that he thought admission to the hospital would be a good idea.  Past Psychiatric History: Details are lacking other than we have a past history of amphetamine abuse  Risk to Self:   Risk to Others:   Prior Inpatient Therapy:   Prior Outpatient Therapy:    Past Medical History: No past medical history on file.  Past Surgical History:  Procedure Laterality Date   APPENDECTOMY     LEG SURGERY Left    Family History: No family history on file. Family Psychiatric  History: See previous Social History:  Social History   Substance and Sexual Activity  Alcohol Use Not Currently   Comment: daily use     Social History   Substance and  Sexual Activity  Drug Use Yes   Types: Methamphetamines    Social History   Socioeconomic History   Marital status: Single    Spouse name: Not on file   Number of children: Not on file   Years of education: Not on file   Highest education level: Not on file  Occupational History   Not on file  Tobacco Use   Smoking status: Every Day    Packs/day: 1.00    Types: Cigarettes   Smokeless tobacco: Never  Substance and Sexual Activity   Alcohol use: Not Currently    Comment: daily use   Drug use: Yes    Types: Methamphetamines   Sexual activity: Not on file  Other Topics Concern   Not on file  Social History Narrative   Not on file   Social Determinants of Health   Financial Resource Strain: Not on file  Food Insecurity: Not on file  Transportation Needs: Not on file  Physical Activity: Not on file  Stress: Not on file  Social Connections: Not on file   Additional Social History:    Allergies:   Allergies  Allergen Reactions   Penicillins     Labs:  Results for orders placed or performed during the hospital encounter of 06/29/21 (from the past 48 hour(s))  CBC     Status: None   Collection Time: 06/29/21  9:50 PM  Result Value Ref Range   WBC 9.8 4.0 - 10.5 K/uL  RBC 4.60 4.22 - 5.81 MIL/uL   Hemoglobin 13.8 13.0 - 17.0 g/dL   HCT 37.1 06.2 - 69.4 %   MCV 88.3 80.0 - 100.0 fL   MCH 30.0 26.0 - 34.0 pg   MCHC 34.0 30.0 - 36.0 g/dL   RDW 85.4 62.7 - 03.5 %   Platelets 261 150 - 400 K/uL   nRBC 0.0 0.0 - 0.2 %    Comment: Performed at [facility], [address], [place], [place] [address]  Resp Panel by RT-PCR (Flu A&B, Covid) Nasopharyngeal Swab     Status: None   Collection Time: 06/29/21  9:50 PM   Specimen: Nasopharyngeal Swab; Nasopharyngeal(NP) swabs in vial transport medium  Result Value Ref Range   SARS Coronavirus 2 by RT PCR NEGATIVE NEGATIVE    Comment: (NOTE) SARS-CoV-2 target nucleic acids are NOT DETECTED.  The SARS-CoV-2 RNA  is generally detectable in upper respiratory specimens during the acute phase of infection. The lowest concentration of SARS-CoV-2 viral copies this assay can detect is 138 copies/mL. A negative result does not preclude SARS-Cov-2 infection and should not be used as the sole basis for treatment or other patient management decisions. A negative result may occur with  improper specimen collection/handling, submission of specimen other than nasopharyngeal swab, presence of viral mutation(s) within the areas targeted by this assay, and inadequate number of viral copies(<138 copies/mL). A negative result must be combined with clinical observations, patient history, and epidemiological information. The expected result is Negative.  Fact Sheet for Patients:  BloggerCourse.com  Fact Sheet for Healthcare Providers:  SeriousBroker.it  This test is no t yet approved or cleared by the [place] FDA and  has been authorized for detection and/or diagnosis of SARS-CoV-2 by FDA under an Emergency Use Authorization (EUA). This EUA will remain  in effect (meaning this test can be used) for the duration of the COVID-19 declaration under Section 564(b)(1) of the Act, 21 U.S.C.section 360bbb-3(b)(1), unless the authorization is terminated  or revoked sooner.       Influenza A by PCR NEGATIVE NEGATIVE   Influenza B by PCR NEGATIVE NEGATIVE    Comment: (NOTE) The Xpert Xpress SARS-CoV-2/FLU/RSV plus assay is intended as an aid in the diagnosis of influenza from Nasopharyngeal swab specimens and should not be used as a sole basis for treatment. Nasal washings and aspirates are unacceptable for Xpert Xpress SARS-CoV-2/FLU/RSV testing.  Fact Sheet for Patients: BloggerCourse.com  Fact Sheet for Healthcare Providers: SeriousBroker.it  This test is not yet approved or cleared by the [place] FDA  and has been authorized for detection and/or diagnosis of SARS-CoV-2 by FDA under an Emergency Use Authorization (EUA). This EUA will remain in effect (meaning this test can be used) for the duration of the COVID-19 declaration under Section 564(b)(1) of the Act, 21 U.S.C. section 360bbb-3(b)(1), unless the authorization is terminated or revoked.  Performed at [facility], [address] [place], [place] [address]   Ethanol     Status: None   Collection Time: 06/29/21  9:50 PM  Result Value Ref Range   Alcohol, Ethyl (B) <10 <10 mg/dL    Comment: (NOTE) Lowest detectable limit for serum alcohol is 10 mg/dL.  For medical purposes only. Performed at [facility], [address] [place], [place] [address]   Acetaminophen level     Status: Abnormal   Collection Time: 06/29/21  9:50 PM  Result Value Ref Range   Acetaminophen (Tylenol), Serum <10 (L) 10 -  30 ug/mL    Comment: (NOTE) Therapeutic concentrations vary significantly. A range of 10-30 ug/mL  may be an effective concentration for many patients. However, some  are best treated at concentrations outside of this range. Acetaminophen concentrations >150 ug/mL at 4 hours after ingestion  and >50 ug/mL at 12 hours after ingestion are often associated with  toxic reactions.  Performed at [facility], [address] [place], [place] [address]   Comprehensive metabolic panel     Status: Abnormal   Collection Time: 06/29/21 10:35 PM  Result Value Ref Range   Sodium 139 135 - 145 mmol/L   Potassium 4.6 3.5 - 5.1 mmol/L   Chloride 105 98 - 111 mmol/L   CO2 25 22 - 32 mmol/L   Glucose, Bld 86 70 - 99 mg/dL    Comment: Glucose reference range applies only to samples taken after fasting for at least 8 hours.   BUN 31 (H) 6 - 20 mg/dL   Creatinine, Ser 6.04 0.61 - 1.24 mg/dL   Calcium 9.5 8.9 - 54.0 mg/dL   Total Protein 7.4 6.5 - 8.1 g/dL   Albumin 4.1 3.5 - 5.0 g/dL   AST 69 (H) 15 - 41 U/L    ALT 176 (H) 0 - 44 U/L   Alkaline Phosphatase 33 (L) 38 - 126 U/L   Total Bilirubin 2.8 (H) 0.3 - 1.2 mg/dL   GFR, Estimated >98 >11 mL/min    Comment: (NOTE) Calculated using the CKD-EPI Creatinine Equation (2021)    Anion gap 9 5 - 15    Comment: Performed at [facility], [address] [place], [place] [address]    No current facility-administered medications for this encounter.   No current outpatient medications on file.    Musculoskeletal: Strength & Muscle Tone: within normal limits Gait & Station: normal Patient leans: N/A            Psychiatric Specialty Exam:  Presentation  General Appearance:  No data recorded Eye Contact: No data recorded Speech: No data recorded Speech Volume: No data recorded Handedness: No data recorded  Mood and Affect  Mood: No data recorded Affect: No data recorded  Thought Process  Thought Processes: No data recorded Descriptions of Associations:No data recorded Orientation:No data recorded Thought Content:No data recorded History of Schizophrenia/Schizoaffective disorder:No data recorded Duration of Psychotic Symptoms:No data recorded Hallucinations:No data recorded Ideas of Reference:No data recorded Suicidal Thoughts:No data recorded Homicidal Thoughts:No data recorded  Sensorium  Memory: No data recorded Judgment: No data recorded Insight: No data recorded  Executive Functions  Concentration: No data recorded Attention Span: No data recorded Recall: No data recorded Fund of Knowledge: No data recorded Language: No data recorded  Psychomotor Activity  Psychomotor Activity: No data recorded  Assets  Assets: No data recorded  Sleep  Sleep: No data recorded  Physical Exam: Physical Exam Vitals and nursing note reviewed.  Constitutional:      Appearance: Normal appearance.  HENT:     Head: Normocephalic and atraumatic.     Mouth/Throat:     Pharynx: Oropharynx is  clear.  Eyes:     Pupils: Pupils are equal, round, and reactive to light.  Cardiovascular:     Rate and Rhythm: Normal rate and regular rhythm.  Pulmonary:     Effort: Pulmonary effort is normal.     Breath sounds: Normal breath sounds.  Abdominal:     General: Abdomen is flat.     Palpations: Abdomen is soft.  Musculoskeletal:        General: Normal range of motion.  Skin:    General: Skin is warm and dry.  Neurological:     General: No focal deficit present.     Mental Status: He is alert. Mental status is at baseline.  Psychiatric:        Attention and Perception: He is inattentive.        Mood and Affect: Mood normal. Affect is blunt.        Speech: He is noncommunicative.        Behavior: Behavior is withdrawn.   Review of Systems  Unable to perform ROS: Psychiatric disorder  Blood pressure (!) 95/54, pulse 80, temperature 98.1 F (36.7 C), temperature source Oral, resp. rate 18, SpO2 96 %. There is no height or weight on file to calculate BMI.  Treatment Plan Summary: Plan patient appears to be still having psychotic symptoms or severe withdrawal probably resulting from drug abuse.  Physically appears to be stable.  Tentative plan now will be for admission to the hospital for what is likely drug-induced psychosis.  Labs reviewed.  Disposition: Recommend psychiatric Inpatient admission when medically cleared.  Mordecai RasmussenJohn Shahab Polhamus, MD 06/30/2021 2:26 PM

## 2021-06-30 NOTE — Consult Note (Signed)
[facility] Face-to-Face Psychiatry Consult   Reason for Consult: Brief follow-up note for patient who has now woken up Referring Physician: Katrinka Blazing Patient Identification: Danny Turner MRN:  [id number] Principal Diagnosis: Amphetamine abuse (HCC) Diagnosis:  Principal Problem:   Amphetamine abuse (HCC) Active Problems:   Psychoactive substance-induced psychosis (HCC)   Total Time spent with patient: 20 minutes  Subjective:   Danny Turner is a 30 y.o. male patient admitted with "I do not remember".  HPI: Patient is now awake and ambulatory and requesting discharge.  He states he does not remember coming into the hospital.  Remembers smoking meth yesterday.  States he is using methamphetamine regularly sometimes other drugs as well and also gets [name] off the street.  Patient denies suicidal or homicidal ideation.  Denies any hallucinations or psychosis now.  Able to state a place to live.  Patient is lucid and appears able to care for himself.  He is requesting discharge.  Past Psychiatric History: He does acknowledge substance abuse treatment in the past and a desire to stop using drugs.  Denies past suicide attempts or violence.  Says he has been prescribed Zoloft in the past no other psychiatric medicine  Risk to Self:   Risk to Others:   Prior Inpatient Therapy:   Prior Outpatient Therapy:    Past Medical History: No past medical history on file.  Past Surgical History:  Procedure Laterality Date   APPENDECTOMY     LEG SURGERY Left    Family History: No family history on file. Family Psychiatric  History: None noted Social History:  Social History   Substance and Sexual Activity  Alcohol Use Not Currently   Comment: daily use     Social History   Substance and Sexual Activity  Drug Use Yes   Types: Methamphetamines    Social History   Socioeconomic History   Marital status: Single    Spouse name: Not on file   Number of children: Not on file   Years of  education: Not on file   Highest education level: Not on file  Occupational History   Not on file  Tobacco Use   Smoking status: Every Day    Packs/day: 1.00    Types: Cigarettes   Smokeless tobacco: Never  Substance and Sexual Activity   Alcohol use: Not Currently    Comment: daily use   Drug use: Yes    Types: Methamphetamines   Sexual activity: Not on file  Other Topics Concern   Not on file  Social History Narrative   Not on file   Social Determinants of Health   Financial Resource Strain: Not on file  Food Insecurity: Not on file  Transportation Needs: Not on file  Physical Activity: Not on file  Stress: Not on file  Social Connections: Not on file   Additional Social History:    Allergies:   Allergies  Allergen Reactions   Penicillins     Labs:  Results for orders placed or performed during the hospital encounter of 06/29/21 (from the past 48 hour(s))  CBC     Status: None   Collection Time: 06/29/21  9:50 PM  Result Value Ref Range   WBC 9.8 4.0 - 10.5 K/uL   RBC 4.60 4.22 - 5.81 MIL/uL   Hemoglobin 13.8 13.0 - 17.0 g/dL   HCT 76.8 11.5 - 72.6 %   MCV 88.3 80.0 - 100.0 fL   MCH 30.0 26.0 - 34.0 pg   MCHC 34.0 30.0 -  36.0 g/dL   RDW 16.0 10.9 - 32.3 %   Platelets 261 150 - 400 K/uL   nRBC 0.0 0.0 - 0.2 %    Comment: Performed at [facility], [address] [place], [place] [address]  Resp Panel by RT-PCR (Flu A&B, Covid) Nasopharyngeal Swab     Status: None   Collection Time: 06/29/21  9:50 PM   Specimen: Nasopharyngeal Swab; Nasopharyngeal(NP) swabs in vial transport medium  Result Value Ref Range   SARS Coronavirus 2 by RT PCR NEGATIVE NEGATIVE    Comment: (NOTE) SARS-CoV-2 target nucleic acids are NOT DETECTED.  The SARS-CoV-2 RNA is generally detectable in upper respiratory specimens during the acute phase of infection. The lowest concentration of SARS-CoV-2 viral copies this assay can detect is 138 copies/mL. A negative result  does not preclude SARS-Cov-2 infection and should not be used as the sole basis for treatment or other patient management decisions. A negative result may occur with  improper specimen collection/handling, submission of specimen other than nasopharyngeal swab, presence of viral mutation(s) within the areas targeted by this assay, and inadequate number of viral copies(<138 copies/mL). A negative result must be combined with clinical observations, patient history, and epidemiological information. The expected result is Negative.  Fact Sheet for Patients:  BloggerCourse.com  Fact Sheet for Healthcare Providers:  SeriousBroker.it  This test is no t yet approved or cleared by the [place] FDA and  has been authorized for detection and/or diagnosis of SARS-CoV-2 by FDA under an Emergency Use Authorization (EUA). This EUA will remain  in effect (meaning this test can be used) for the duration of the COVID-19 declaration under Section 564(b)(1) of the Act, 21 U.S.C.section 360bbb-3(b)(1), unless the authorization is terminated  or revoked sooner.       Influenza A by PCR NEGATIVE NEGATIVE   Influenza B by PCR NEGATIVE NEGATIVE    Comment: (NOTE) The Xpert Xpress SARS-CoV-2/FLU/RSV plus assay is intended as an aid in the diagnosis of influenza from Nasopharyngeal swab specimens and should not be used as a sole basis for treatment. Nasal washings and aspirates are unacceptable for Xpert Xpress SARS-CoV-2/FLU/RSV testing.  Fact Sheet for Patients: BloggerCourse.com  Fact Sheet for Healthcare Providers: SeriousBroker.it  This test is not yet approved or cleared by the [place] FDA and has been authorized for detection and/or diagnosis of SARS-CoV-2 by FDA under an Emergency Use Authorization (EUA). This EUA will remain in effect (meaning this test can be used) for the duration  of the COVID-19 declaration under Section 564(b)(1) of the Act, 21 U.S.C. section 360bbb-3(b)(1), unless the authorization is terminated or revoked.  Performed at [facility], [address] [place], [place] [address]   Ethanol     Status: None   Collection Time: 06/29/21  9:50 PM  Result Value Ref Range   Alcohol, Ethyl (B) <10 <10 mg/dL    Comment: (NOTE) Lowest detectable limit for serum alcohol is 10 mg/dL.  For medical purposes only. Performed at [facility], [address] [place], [place] [address]   Acetaminophen level     Status: Abnormal   Collection Time: 06/29/21  9:50 PM  Result Value Ref Range   Acetaminophen (Tylenol), Serum <10 (L) 10 - 30 ug/mL    Comment: (NOTE) Therapeutic concentrations vary significantly. A range of 10-30 ug/mL  may be an effective concentration for many patients. However, some  are best treated at concentrations outside of this range. Acetaminophen concentrations >150 ug/mL at 4 hours  after ingestion  and >50 ug/mL at 12 hours after ingestion are often associated with  toxic reactions.  Performed at [facility], [address] [place], [place] [address]   Comprehensive metabolic panel     Status: Abnormal   Collection Time: 06/29/21 10:35 PM  Result Value Ref Range   Sodium 139 135 - 145 mmol/L   Potassium 4.6 3.5 - 5.1 mmol/L   Chloride 105 98 - 111 mmol/L   CO2 25 22 - 32 mmol/L   Glucose, Bld 86 70 - 99 mg/dL    Comment: Glucose reference range applies only to samples taken after fasting for at least 8 hours.   BUN 31 (H) 6 - 20 mg/dL   Creatinine, Ser 1.911.04 0.61 - 1.24 mg/dL   Calcium 9.5 8.9 - 47.810.3 mg/dL   Total Protein 7.4 6.5 - 8.1 g/dL   Albumin 4.1 3.5 - 5.0 g/dL   AST 69 (H) 15 - 41 U/L   ALT 176 (H) 0 - 44 U/L   Alkaline Phosphatase 33 (L) 38 - 126 U/L   Total Bilirubin 2.8 (H) 0.3 - 1.2 mg/dL   GFR, Estimated >29>60 >56>60 mL/min    Comment: (NOTE) Calculated using the CKD-EPI  Creatinine Equation (2021)    Anion gap 9 5 - 15    Comment: Performed at [facility], [address] [place], [place] [address]    No current facility-administered medications for this encounter.   No current outpatient medications on file.    Musculoskeletal: Strength & Muscle Tone: within normal limits Gait & Station: normal Patient leans: N/A            Psychiatric Specialty Exam:  Presentation  General Appearance:  No data recorded Eye Contact: No data recorded Speech: No data recorded Speech Volume: No data recorded Handedness: No data recorded  Mood and Affect  Mood: No data recorded Affect: No data recorded  Thought Process  Thought Processes: No data recorded Descriptions of Associations:No data recorded Orientation:No data recorded Thought Content:No data recorded History of Schizophrenia/Schizoaffective disorder:No data recorded Duration of Psychotic Symptoms:No data recorded Hallucinations:No data recorded Ideas of Reference:No data recorded Suicidal Thoughts:No data recorded Homicidal Thoughts:No data recorded  Sensorium  Memory: No data recorded Judgment: No data recorded Insight: No data recorded  Executive Functions  Concentration: No data recorded Attention Span: No data recorded Recall: No data recorded Fund of Knowledge: No data recorded Language: No data recorded  Psychomotor Activity  Psychomotor Activity: No data recorded  Assets  Assets: No data recorded  Sleep  Sleep: No data recorded  Physical Exam: Physical Exam Constitutional:      Appearance: Normal appearance.  HENT:     Head: Normocephalic and atraumatic.     Mouth/Throat:     Pharynx: Oropharynx is clear.  Eyes:     Pupils: Pupils are equal, round, and reactive to light.  Cardiovascular:     Rate and Rhythm: Normal rate and regular rhythm.  Pulmonary:     Effort: Pulmonary effort is normal.     Breath sounds: Normal  breath sounds.  Abdominal:     General: Abdomen is flat.     Palpations: Abdomen is soft.  Musculoskeletal:        General: Normal range of motion.  Skin:    General: Skin is warm and dry.  Neurological:     General: No focal deficit present.     Mental Status: He is alert. Mental status is at baseline.  Psychiatric:        Attention and Perception: He is inattentive.        Mood and Affect: Mood normal. Affect is blunt.        Speech: Speech is delayed.        Behavior: Behavior is slowed and withdrawn.        Thought Content: Thought content normal. Thought content does not include homicidal or suicidal ideation.        Cognition and Memory: Memory is impaired.   Review of Systems  Constitutional: Negative.   HENT: Negative.    Eyes: Negative.   Respiratory: Negative.    Cardiovascular: Negative.   Gastrointestinal: Negative.   Musculoskeletal: Negative.   Skin: Negative.   Neurological: Negative.   Psychiatric/Behavioral:  Positive for substance abuse. Negative for suicidal ideas.   Blood pressure (!) 95/54, pulse 80, temperature 98.1 F (36.7 C), temperature source Oral, resp. rate 18, SpO2 96 %. There is no height or weight on file to calculate BMI.  Treatment Plan Summary: Plan patient is now awake alert and interactive.  Does not meet commitment criteria.  Requesting discharge.  Advised patient that there is substance abuse treatment available in the community and offered to give him referral information to [facility] which he is agreeable to.  Described the ready availability of substance abuse treatment including intensive outpatient.  Gave him information regarding [facility].  Case reviewed with ER physician.  No need for any prescriptions at this point.  Psychoeducation completed.  Disposition: No evidence of imminent risk to self or others at present.   Supportive therapy provided about ongoing stressors. Discussed crisis plan, support from social network, calling 911, coming to  the Emergency Department, and calling Suicide Hotline.  Mordecai Rasmussen, MD 06/30/2021 2:58 PM

## 2021-07-11 ENCOUNTER — Other Ambulatory Visit: Payer: Self-pay

## 2021-07-11 ENCOUNTER — Emergency Department
Admission: EM | Admit: 2021-07-11 | Discharge: 2021-07-11 | Disposition: A | Discharge status: Home / Self Care | Payer: Self-pay | Attending: Emergency Medicine | Admitting: Emergency Medicine

## 2021-07-11 ENCOUNTER — Emergency Department: Payer: Self-pay

## 2021-07-11 DIAGNOSIS — T50901A Poisoning by unspecified drugs, medicaments and biological substances, accidental (unintentional), initial encounter: Secondary | ICD-10-CM

## 2021-07-11 DIAGNOSIS — R451 Restlessness and agitation: Secondary | ICD-10-CM | POA: Insufficient documentation

## 2021-07-11 DIAGNOSIS — T50991A Poisoning by other drugs, medicaments and biological substances, accidental (unintentional), initial encounter: Secondary | ICD-10-CM | POA: Insufficient documentation

## 2021-07-11 DIAGNOSIS — F17210 Nicotine dependence, cigarettes, uncomplicated: Secondary | ICD-10-CM | POA: Insufficient documentation

## 2021-07-11 DIAGNOSIS — R000 Tachycardia, unspecified: Secondary | ICD-10-CM | POA: Insufficient documentation

## 2021-07-11 DIAGNOSIS — Y900 Blood alcohol level of less than 20 mg/100 ml: Secondary | ICD-10-CM | POA: Insufficient documentation

## 2021-07-11 DIAGNOSIS — Z79899 Other long term (current) drug therapy: Secondary | ICD-10-CM | POA: Insufficient documentation

## 2021-07-11 DIAGNOSIS — Z008 Encounter for other general examination: Secondary | ICD-10-CM

## 2021-07-11 LAB — COMPREHENSIVE METABOLIC PANEL
ALT: 202 U/L — ABNORMAL HIGH (ref 0–44)
AST: 109 U/L — ABNORMAL HIGH (ref 15–41)
Albumin: 4.8 g/dL (ref 3.5–5.0)
Alkaline Phosphatase: 39 U/L (ref 38–126)
Anion gap: 9 (ref 5–15)
BUN: 30 mg/dL — ABNORMAL HIGH (ref 6–20)
CO2: 25 mmol/L (ref 22–32)
Calcium: 9.9 mg/dL (ref 8.9–10.3)
Chloride: 108 mmol/L (ref 98–111)
Creatinine, Ser: 1.13 mg/dL (ref 0.61–1.24)
GFR, Estimated: >60 mL/min (ref >60)
Glucose, Bld: 91 mg/dL (ref 70–99)
Potassium: 4.1 mmol/L (ref 3.5–5.1)
Sodium: 142 mmol/L (ref 135–145)
Total Bilirubin: 3.4 mg/dL — ABNORMAL HIGH (ref 0.3–1.2)
Total Protein: 8.4 g/dL — ABNORMAL HIGH (ref 6.5–8.1)

## 2021-07-11 LAB — CBC WITH DIFFERENTIAL/PLATELET
Abs Immature Granulocytes: 0.03 10*3/uL (ref 0.00–0.07)
Basophils Absolute: 0 10*3/uL (ref 0.0–0.1)
Basophils Relative: 0 %
Eosinophils Absolute: 0.1 10*3/uL (ref 0.0–0.5)
Eosinophils Relative: 1 %
HCT: 42.2 % (ref 39.0–52.0)
Hemoglobin: 14.6 g/dL (ref 13.0–17.0)
Immature Granulocytes: 0 %
Lymphocytes Relative: 25 %
Lymphs Abs: 2.3 10*3/uL (ref 0.7–4.0)
MCH: 30.3 pg (ref 26.0–34.0)
MCHC: 34.6 g/dL (ref 30.0–36.0)
MCV: 87.6 fL (ref 80.0–100.0)
Monocytes Absolute: 1.1 10*3/uL — ABNORMAL HIGH (ref 0.1–1.0)
Monocytes Relative: 12 %
Neutro Abs: 5.7 10*3/uL (ref 1.7–7.7)
Neutrophils Relative %: 62 %
Platelets: 308 10*3/uL (ref 150–400)
RBC: 4.82 MIL/uL (ref 4.22–5.81)
RDW: 13.2 % (ref 11.5–15.5)
WBC: 9.1 10*3/uL (ref 4.0–10.5)
nRBC: 0 % (ref 0.0–0.2)

## 2021-07-11 LAB — ETHANOL: Alcohol, Ethyl (B): <10 mg/dL (ref <10)

## 2021-07-11 IMAGING — DX DG CHEST 1V PORT
2 series · 2 of 2 positions shown · non-contrast
Comparison: [DATE]

CLINICAL DATA: Agitation related to methamphetamine use

EXAM:
PORTABLE CHEST 1 VIEW

[chest ap (1 of 2)]
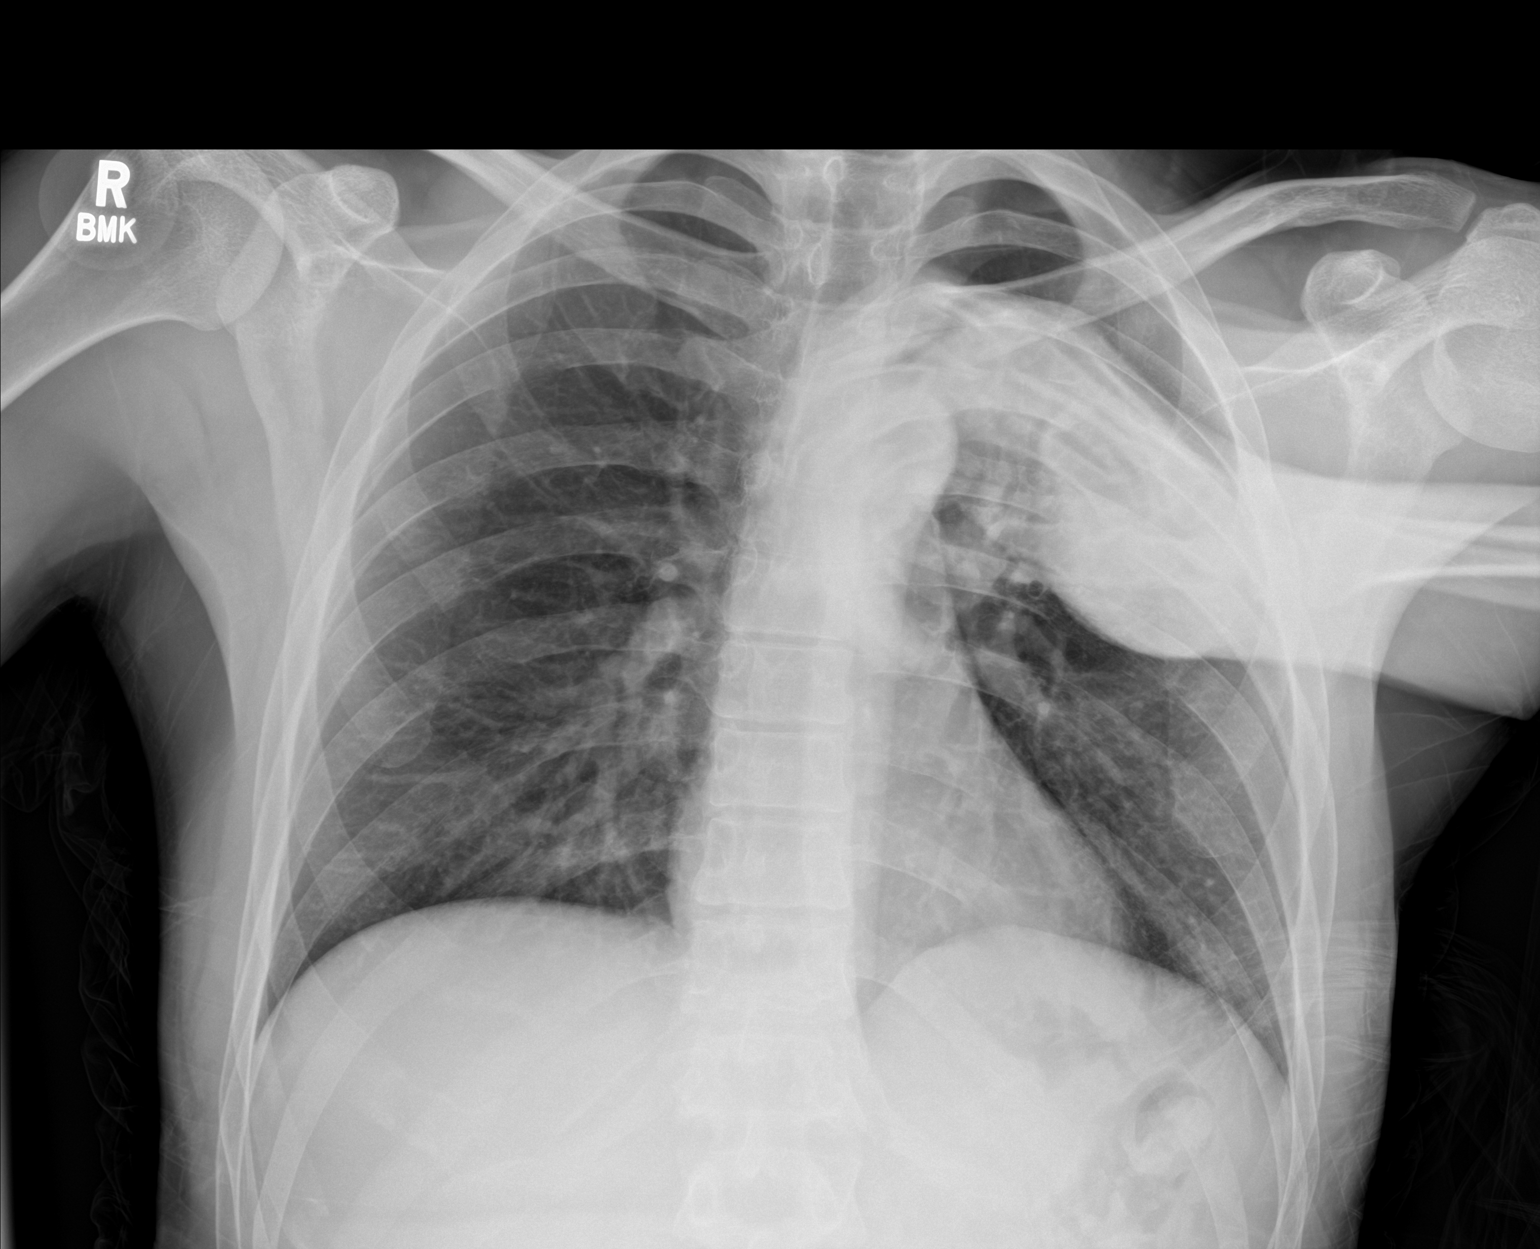

[chest ap (2 of 2)]
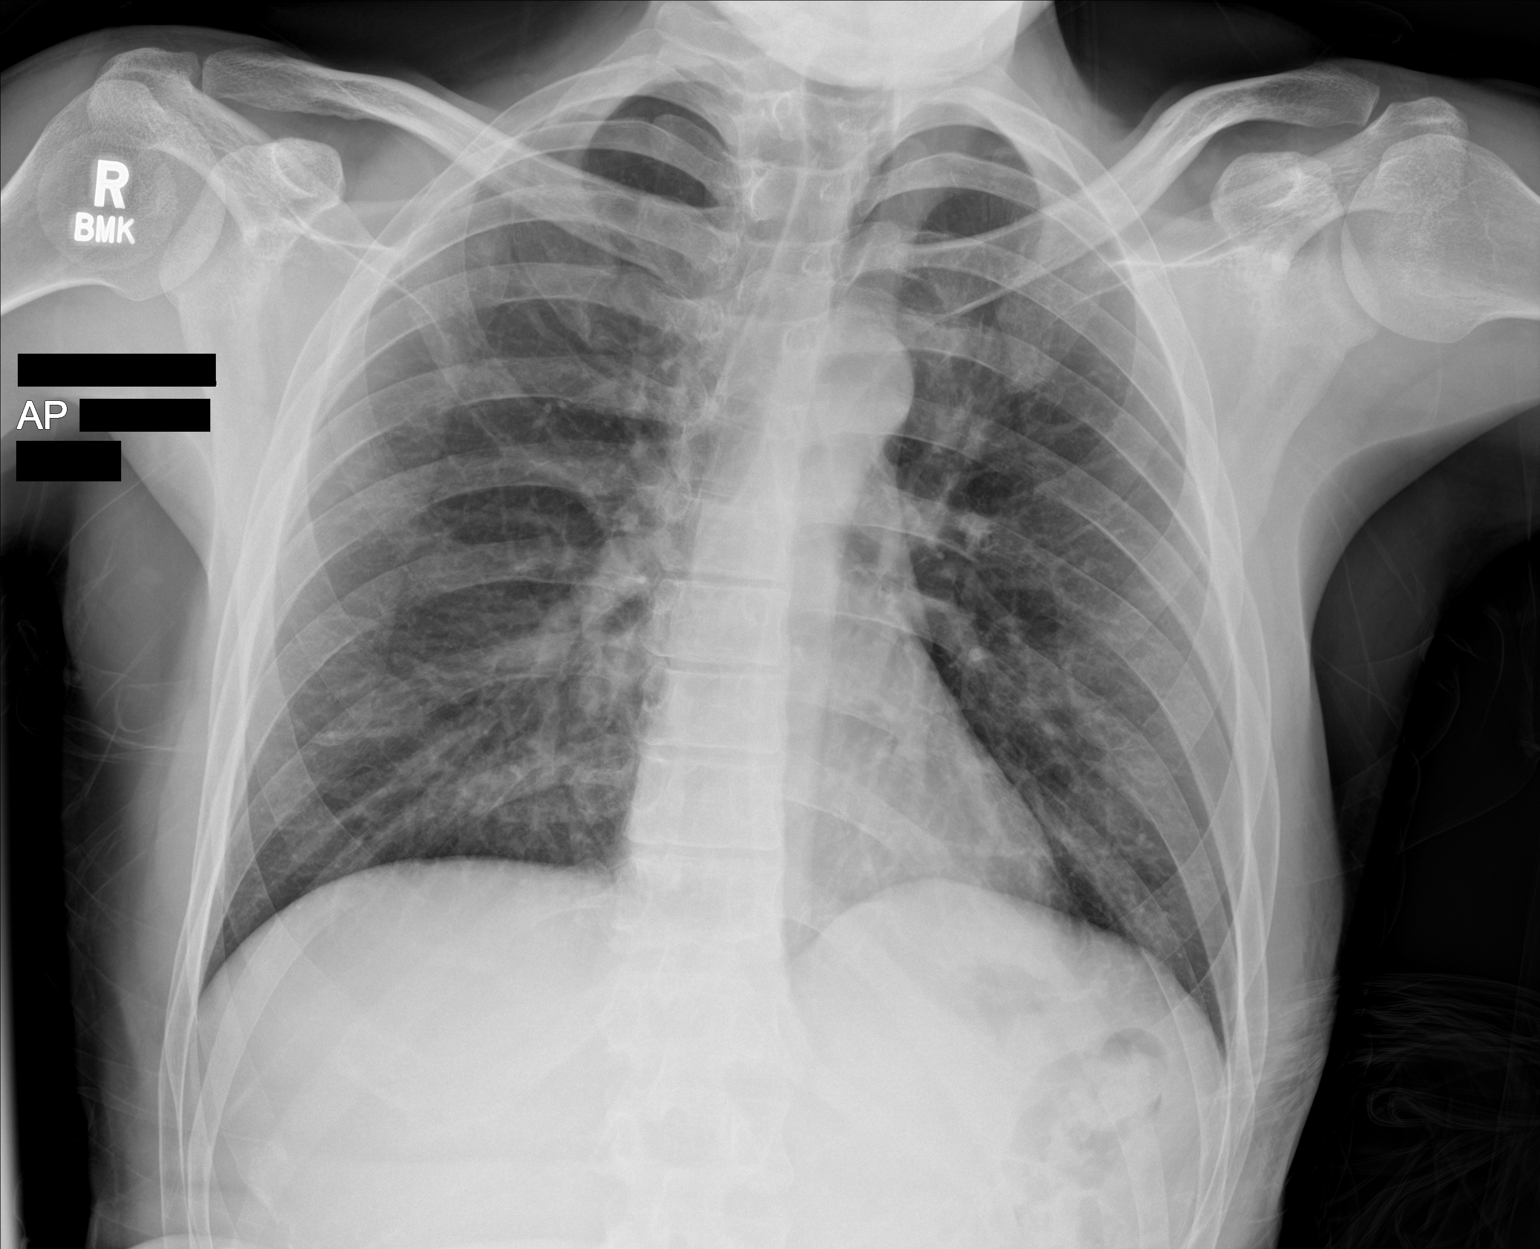

[2 of 2 positions shown; findings below may reference images not displayed]

FINDINGS: Normal heart size and mediastinal contours. No acute infiltrate or
edema. No effusion or pneumothorax. No acute osseous findings.
IMPRESSION: Negative portable chest.

## 2021-07-11 MED ORDER — DIPHENHYDRAMINE HCL 50 MG/ML IJ SOLN
50.0000 mg | Freq: Once | INTRAMUSCULAR | Status: AC
  Administered 2021-07-11: 50 mg via INTRAMUSCULAR
  Filled 2021-07-11: qty 1

## 2021-07-11 MED ORDER — MIDAZOLAM HCL 2 MG/2ML IJ SOLN: 2.0000 mg | Freq: Once | INTRAMUSCULAR | Status: AC

## 2021-07-11 MED ORDER — MIDAZOLAM HCL 5 MG/5ML IJ SOLN
INTRAMUSCULAR | Status: AC
  Administered 2021-07-11: 2 mg via INTRAMUSCULAR
  Filled 2021-07-11: qty 5

## 2021-07-11 MED ORDER — HALOPERIDOL LACTATE 5 MG/ML IJ SOLN: 5.0000 mg | Freq: Once | INTRAMUSCULAR | Status: AC

## 2021-07-11 MED ORDER — HALOPERIDOL LACTATE 5 MG/ML IJ SOLN
INTRAMUSCULAR | Status: AC
  Administered 2021-07-11: 5 mg via INTRAMUSCULAR
  Filled 2021-07-11: qty 1

## 2021-07-11 MED ORDER — SODIUM CHLORIDE 0.9 % IV BOLUS
1000.0000 mL | Freq: Once | INTRAVENOUS | Status: AC
Start: 2021-07-11 — End: 2021-07-11
  Administered 2021-07-11: 1000 mL via INTRAVENOUS

## 2021-07-11 NOTE — ED Provider Notes (Signed)
[facility] Emergency Department Provider Note   ____________________________________________   Event Date/Time   First MD Initiated Contact with Patient 07/11/21 [phone]     (approximate)  I have reviewed the triage vital signs and the nursing notes.   HISTORY  Chief Complaint Medical Clearance (Pt. Arrived to ED in police custody for medical clearance. Pt. Is yelling, verbally aggressive, and screaming profanity. PD states pt. Was stealing cars and breaking and entering tonight. No trauma that PD or EMS know of. Pt. Refusing to answer questions.)  Level of V caveat: Limited by agitation  HPI Danny Turner is a 30 y.o. male brought to the ED via EMS in police custody for medical clearance.  Suspected methamphetamine use.  Patient was found stealing cars and breaking and entering. No history of trauma.  Patient arrives agitated, verbally aggressive, screaming profanities.  Uncooperative.  Rest of history limited by uncooperative nature.     Patient Active Problem List   Diagnosis Date Noted   Psychoactive substance-induced psychosis (HCC) 06/30/2021   Amphetamine abuse (HCC) 06/30/2021    Past Surgical History:  Procedure Laterality Date   APPENDECTOMY     LEG SURGERY Left     Prior to Admission medications   Not on File    Allergies Penicillins  No family history on file.  Social History Social History   Tobacco Use   Smoking status: Every Day    Packs/day: 1.00    Types: Cigarettes   Smokeless tobacco: Never  Substance Use Topics   Alcohol use: Not Currently    Comment: daily use   Drug use: Yes    Types: Methamphetamines    Review of Systems  Constitutional: No fever/chills Eyes: No visual changes. ENT: No sore throat. Cardiovascular: Denies chest pain. Respiratory: Denies shortness of breath. Gastrointestinal: No abdominal pain.  No nausea, no vomiting.  No diarrhea.  No constipation. Genitourinary: Negative for  dysuria. Musculoskeletal: Negative for back pain. Skin: Negative for rash. Neurological: Negative for headaches, focal weakness or numbness. Psychiatric: Positive for agitation.  ____________________________________________   PHYSICAL EXAM:  VITAL SIGNS: ED Triage Vitals  Enc Vitals Group     BP      Pulse      Resp      Temp      Temp src      SpO2      Weight      Height      Head Circumference      Peak Flow      Pain Score      Pain Loc      Pain Edu?      Excl. in GC?     Constitutional: Alert and oriented.  Disheveled appearing and agitated, screaming profanities.   Eyes: Conjunctivae are normal. PERRL. EOMI. Head: Atraumatic. Nose: Atraumatic. Mouth/Throat: Mucous membranes are moist.  No dental malocclusion. Neck: No stridor.   Cardiovascular: Tachycardic rate, regular rhythm. Grossly normal heart sounds.  Good peripheral circulation. Respiratory: Increased respiratory effort.  No retractions. Lungs CTAB. Gastrointestinal: Soft and nontender to light or deep palpation. No distention. No abdominal bruits. No CVA tenderness. Musculoskeletal: No lower extremity tenderness nor edema.  No joint effusions. Neurologic:  Normal speech and language. No gross focal neurologic deficits are appreciated. MAEx4. Skin:  Skin is warm, dry and intact. No rash noted. Psychiatric: Mood and affect are agitated. Speech and behavior are yelling profanity, uncooperative.  ____________________________________________   LABS (all labs ordered are listed, but  only abnormal results are displayed)  Labs Reviewed  CBC WITH DIFFERENTIAL/PLATELET - Abnormal; Notable for the following components:      Result Value   Monocytes Absolute 1.1 (*)    All other components within normal limits  COMPREHENSIVE METABOLIC PANEL - Abnormal; Notable for the following components:   BUN 30 (*)    Total Protein 8.4 (*)    AST 109 (*)    ALT 202 (*)    Total Bilirubin 3.4 (*)    All other  components within normal limits  ETHANOL  URINE DRUG SCREEN, QUALITATIVE ([facility] ONLY)   ____________________________________________  EKG  ED ECG REPORT I, Janan Bogie J, the attending physician, personally viewed and interpreted this ECG.   Date: 07/11/2021  EKG Time: 0550  Rate: 74  Rhythm: normal sinus rhythm  Axis: Normal  Intervals:none  ST&T Change: Nonspecific  ____________________________________________  RADIOLOGY I, Saidee Geremia J, personally viewed and evaluated these images (plain radiographs) as part of my medical decision making, as well as reviewing the written report by the radiologist.  ED MD interpretation: No acute cardiopulmonary process  Official radiology report(s): DG Chest Port 1 View  Result Date: 07/11/2021 CLINICAL DATA:  Agitation related to methamphetamine use EXAM: PORTABLE CHEST 1 VIEW COMPARISON:  05/29/2021 FINDINGS: Normal heart size and mediastinal contours. No acute infiltrate or edema. No effusion or pneumothorax. No acute osseous findings. IMPRESSION: Negative portable chest. Electronically Signed   By: Marnee Spring M.D.   On: 07/11/2021 05:42    ____________________________________________   PROCEDURES  Procedure(s) performed (including Critical Care):  .1-3 Lead EKG Interpretation  Date/Time: 07/11/2021 5:12 AM Performed by: Irean Hong, MD Authorized by: Irean Hong, MD     Interpretation: abnormal     ECG rate:  107   ECG rate assessment: tachycardic     Rhythm: sinus tachycardia     Ectopy: none     Conduction: normal   Comments:     Patient placed on cardiac monitor to evaluate for arrhythmias   ____________________________________________   INITIAL IMPRESSION / ASSESSMENT AND PLAN / ED COURSE  As part of my medical decision making, I reviewed the following data within the electronic MEDICAL RECORD NUMBER Nursing notes reviewed and incorporated, Labs reviewed, EKG interpreted, Old chart reviewed, Radiograph reviewed, and  Notes from prior ED visits     30 year old male presenting for medical clearance for jail.  Suspected methamphetamine use.  Differential diagnosis includes, but is not limited to, alcohol, illicit or prescription medications, or other toxic ingestion; intracranial pathology such as stroke or intracerebral hemorrhage; fever or infectious causes including sepsis; hypoxemia and/or hypercarbia; uremia; trauma; endocrine related disorders such as diabetes, hypoglycemia, and thyroid-related diseases; hypertensive encephalopathy; etc.   Patient is agitated, uncooperative, trying to kick staff.  Able to be verbally redirected.  Requires IM calming agent.  We will check lab work, chest x-ray, infuse IV fluids, monitor and reassess.  Clinical Course as of 07/11/21 1696  Caleen Essex Jul 11, 2021  7893 Care transferred to Dr. Larinda Buttery at change of shift.  Noted elevated LFTs which are comparable to prior.  Anticipate 4-hour observation and if patient remains stable he may be discharged to jail at that time. [JS]    Clinical Course User Index [JS] Irean Hong, MD     ____________________________________________   FINAL CLINICAL IMPRESSION(S) / ED DIAGNOSES  Final diagnoses:  Medical clearance for incarceration  Accidental drug overdose, initial encounter  Agitation     ED Discharge Orders  None        Note:  This document was prepared using Dragon voice recognition software and may include unintentional dictation errors.    Irean Hong, MD 07/11/21 [phone]

## 2021-07-11 NOTE — Discharge Instructions (Addendum)
Return to the ER for worsening symptoms, persistent vomiting, difficulty breathing or other concerns. °

## 2021-07-11 NOTE — ED Notes (Signed)
This RN attempted to contact Danny Turner at [phone] with no answer.

## 2021-07-11 NOTE — ED Provider Notes (Signed)
-----------------------------------------   7:09 AM on 07/11/2021 -----------------------------------------  Blood pressure 109/71, pulse 67, resp. rate 18, SpO2 98 %.  Assuming care from Dr. Dolores Frame.  In short, Danny Turner is a 30 y.o. male with a chief complaint of Medical Clearance (Pt. Arrived to ED in police custody for medical clearance. Pt. Is yelling, verbally aggressive, and screaming profanity. PD states pt. Was stealing cars and breaking and entering tonight. No trauma that PD or EMS know of. Pt. Refusing to answer questions.) .  Refer to the original H&P for additional details.  The current plan of care is to reassess once clinically sober, then may be discharged to jail.  ----------------------------------------- 1:07 PM on 07/11/2021 ----------------------------------------- Patient is awake and alert, tolerating p.o. without difficulty.  He is appropriate for discharge to police custody.    Chesley Noon, MD 07/11/21 [phone]

## 2021-07-11 NOTE — ED Triage Notes (Signed)
Pt. Arrived to ED in police custody for medical clearance. Pt. Is yelling, verbally aggressive, and screaming profanity. PD states pt. Was stealing cars and breaking and entering tonight. No trauma that PD or EMS know of. Pt. Refusing to answer questions.

## 2021-07-11 NOTE — ED Notes (Signed)
Pt visualized in bed. Pt restless and rocking back and forth in bed with eyes closed. Pt remains in PD custody at this time. IVF running. VSS.

## 2021-07-11 NOTE — ED Notes (Signed)
This RN reviewed discharge instructions. Pt verbalized understanding. Pt unable to sign key pad due to pt being in handcuffs.

## 2021-08-01 ENCOUNTER — Emergency Department (HOSPITAL_COMMUNITY): Payer: Self-pay

## 2021-08-01 ENCOUNTER — Other Ambulatory Visit: Payer: Self-pay

## 2021-08-01 ENCOUNTER — Inpatient Hospital Stay (HOSPITAL_COMMUNITY)
Admission: EM | Admit: 2021-08-01 | Discharge: 2021-08-06 | DRG: 922 | Disposition: A | Discharge status: Home / Self Care | Payer: Self-pay | Attending: Internal Medicine | Admitting: Internal Medicine

## 2021-08-01 ENCOUNTER — Encounter (HOSPITAL_COMMUNITY): Payer: Self-pay

## 2021-08-01 DIAGNOSIS — F19959 Other psychoactive substance use, unspecified with psychoactive substance-induced psychotic disorder, unspecified: Secondary | ICD-10-CM | POA: Diagnosis present

## 2021-08-01 DIAGNOSIS — G9340 Encephalopathy, unspecified: Secondary | ICD-10-CM | POA: Insufficient documentation

## 2021-08-01 DIAGNOSIS — G931 Anoxic brain damage, not elsewhere classified: Secondary | ICD-10-CM | POA: Diagnosis present

## 2021-08-01 DIAGNOSIS — E162 Hypoglycemia, unspecified: Secondary | ICD-10-CM | POA: Diagnosis not present

## 2021-08-01 DIAGNOSIS — G929 Unspecified toxic encephalopathy: Secondary | ICD-10-CM | POA: Diagnosis present

## 2021-08-01 DIAGNOSIS — Z6823 Body mass index (BMI) 23.0-23.9, adult: Secondary | ICD-10-CM

## 2021-08-01 DIAGNOSIS — F1510 Other stimulant abuse, uncomplicated: Secondary | ICD-10-CM | POA: Diagnosis present

## 2021-08-01 DIAGNOSIS — E43 Unspecified severe protein-calorie malnutrition: Secondary | ICD-10-CM | POA: Insufficient documentation

## 2021-08-01 DIAGNOSIS — Z818 Family history of other mental and behavioral disorders: Secondary | ICD-10-CM

## 2021-08-01 DIAGNOSIS — J9690 Respiratory failure, unspecified, unspecified whether with hypoxia or hypercapnia: Secondary | ICD-10-CM

## 2021-08-01 DIAGNOSIS — Z880 Allergy status to penicillin: Secondary | ICD-10-CM

## 2021-08-01 DIAGNOSIS — T71162A Asphyxiation due to hanging, intentional self-harm, initial encounter: Principal | ICD-10-CM | POA: Diagnosis present

## 2021-08-01 DIAGNOSIS — J9602 Acute respiratory failure with hypercapnia: Secondary | ICD-10-CM | POA: Diagnosis present

## 2021-08-01 DIAGNOSIS — Z22322 Carrier or suspected carrier of Methicillin resistant Staphylococcus aureus: Secondary | ICD-10-CM

## 2021-08-01 DIAGNOSIS — T1491XA Suicide attempt, initial encounter: Secondary | ICD-10-CM

## 2021-08-01 DIAGNOSIS — Z20822 Contact with and (suspected) exposure to covid-19: Secondary | ICD-10-CM | POA: Diagnosis present

## 2021-08-01 DIAGNOSIS — F17210 Nicotine dependence, cigarettes, uncomplicated: Secondary | ICD-10-CM | POA: Diagnosis present

## 2021-08-01 DIAGNOSIS — R7401 Elevation of levels of liver transaminase levels: Secondary | ICD-10-CM | POA: Diagnosis present

## 2021-08-01 DIAGNOSIS — E872 Acidosis: Secondary | ICD-10-CM | POA: Diagnosis present

## 2021-08-01 DIAGNOSIS — J9601 Acute respiratory failure with hypoxia: Secondary | ICD-10-CM | POA: Diagnosis present

## 2021-08-01 DIAGNOSIS — Z7151 Drug abuse counseling and surveillance of drug abuser: Secondary | ICD-10-CM

## 2021-08-01 DIAGNOSIS — B182 Chronic viral hepatitis C: Secondary | ICD-10-CM | POA: Diagnosis present

## 2021-08-01 DIAGNOSIS — T71164A Asphyxiation due to hanging, undetermined, initial encounter: Secondary | ICD-10-CM | POA: Diagnosis present

## 2021-08-01 DIAGNOSIS — F32A Depression, unspecified: Secondary | ICD-10-CM | POA: Diagnosis present

## 2021-08-01 DIAGNOSIS — E876 Hypokalemia: Secondary | ICD-10-CM | POA: Insufficient documentation

## 2021-08-01 LAB — RAPID URINE DRUG SCREEN, HOSP PERFORMED
Amphetamines: POSITIVE — AB
Barbiturates: NOT DETECTED
Benzodiazepines: POSITIVE — AB
Cocaine: NOT DETECTED
Opiates: NOT DETECTED
Tetrahydrocannabinol: NOT DETECTED

## 2021-08-01 LAB — MAGNESIUM: Magnesium: 2.2 mg/dL (ref 1.7–2.4)

## 2021-08-01 LAB — HEMOGLOBIN A1C
Hgb A1c MFr Bld: 5.1 % (ref 4.8–5.6)
Mean Plasma Glucose: 99.67 mg/dL

## 2021-08-01 LAB — COMPREHENSIVE METABOLIC PANEL
ALT: 141 U/L — ABNORMAL HIGH (ref 0–44)
AST: 91 U/L — ABNORMAL HIGH (ref 15–41)
Albumin: 3.9 g/dL (ref 3.5–5.0)
Alkaline Phosphatase: 35 U/L — ABNORMAL LOW (ref 38–126)
Anion gap: 14 (ref 5–15)
BUN: 19 mg/dL (ref 6–20)
CO2: 20 mmol/L — ABNORMAL LOW (ref 22–32)
Calcium: 10 mg/dL (ref 8.9–10.3)
Chloride: 103 mmol/L (ref 98–111)
Creatinine, Ser: 1.18 mg/dL (ref 0.61–1.24)
GFR, Estimated: >60 mL/min (ref >60)
Glucose, Bld: 211 mg/dL — ABNORMAL HIGH (ref 70–99)
Potassium: 3.2 mmol/L — ABNORMAL LOW (ref 3.5–5.1)
Sodium: 137 mmol/L (ref 135–145)
Total Bilirubin: 2.3 mg/dL — ABNORMAL HIGH (ref 0.3–1.2)
Total Protein: 7 g/dL (ref 6.5–8.1)

## 2021-08-01 LAB — PROTIME-INR
INR: 1.1 (ref 0.8–1.2)
Prothrombin Time: 14.5 seconds (ref 11.4–15.2)

## 2021-08-01 LAB — GLUCOSE, CAPILLARY
Glucose-Capillary: 70 mg/dL (ref 70–99)
Glucose-Capillary: 74 mg/dL (ref 70–99)
Glucose-Capillary: 86 mg/dL (ref 70–99)

## 2021-08-01 LAB — I-STAT CHEM 8, ED
BUN: 21 mg/dL — ABNORMAL HIGH (ref 6–20)
Calcium, Ion: 1.27 mmol/L (ref 1.15–1.40)
Chloride: 106 mmol/L (ref 98–111)
Creatinine, Ser: 1 mg/dL (ref 0.61–1.24)
Glucose, Bld: 207 mg/dL — ABNORMAL HIGH (ref 70–99)
HCT: 42 % (ref 39.0–52.0)
Hemoglobin: 14.3 g/dL (ref 13.0–17.0)
Potassium: 3.3 mmol/L — ABNORMAL LOW (ref 3.5–5.1)
Sodium: 141 mmol/L (ref 135–145)
TCO2: 21 mmol/L — ABNORMAL LOW (ref 22–32)

## 2021-08-01 LAB — RESP PANEL BY RT-PCR (FLU A&B, COVID) ARPGX2
Influenza A by PCR: NEGATIVE
Influenza B by PCR: NEGATIVE
SARS Coronavirus 2 by RT PCR: NEGATIVE

## 2021-08-01 LAB — CBC WITH DIFFERENTIAL/PLATELET
Abs Immature Granulocytes: 0.2 10*3/uL — ABNORMAL HIGH (ref 0.00–0.07)
Basophils Absolute: 0.1 10*3/uL (ref 0.0–0.1)
Basophils Relative: 0 %
Eosinophils Absolute: 0.1 10*3/uL (ref 0.0–0.5)
Eosinophils Relative: 1 %
HCT: 43.7 % (ref 39.0–52.0)
Hemoglobin: 14.5 g/dL (ref 13.0–17.0)
Immature Granulocytes: 1 %
Lymphocytes Relative: 8 %
Lymphs Abs: 1.3 10*3/uL (ref 0.7–4.0)
MCH: 30 pg (ref 26.0–34.0)
MCHC: 33.2 g/dL (ref 30.0–36.0)
MCV: 90.3 fL (ref 80.0–100.0)
Monocytes Absolute: 1.1 10*3/uL — ABNORMAL HIGH (ref 0.1–1.0)
Monocytes Relative: 6 %
Neutro Abs: 14.3 10*3/uL — ABNORMAL HIGH (ref 1.7–7.7)
Neutrophils Relative %: 84 %
Platelets: 219 10*3/uL (ref 150–400)
RBC: 4.84 MIL/uL (ref 4.22–5.81)
RDW: 12.9 % (ref 11.5–15.5)
WBC: 17 10*3/uL — ABNORMAL HIGH (ref 4.0–10.5)
nRBC: 0 % (ref 0.0–0.2)

## 2021-08-01 LAB — I-STAT ARTERIAL BLOOD GAS, ED
Acid-base deficit: 3 mmol/L — ABNORMAL HIGH (ref 0.0–2.0)
Bicarbonate: 25 mmol/L (ref 20.0–28.0)
Calcium, Ion: 1.37 mmol/L (ref 1.15–1.40)
HCT: 41 % (ref 39.0–52.0)
Hemoglobin: 13.9 g/dL (ref 13.0–17.0)
O2 Saturation: 100 %
Patient temperature: 98.1
Potassium: 3.2 mmol/L — ABNORMAL LOW (ref 3.5–5.1)
Sodium: 141 mmol/L (ref 135–145)
TCO2: 27 mmol/L (ref 22–32)
pCO2 arterial: 52.9 mmHg — ABNORMAL HIGH (ref 32.0–48.0)
pH, Arterial: 7.281 — ABNORMAL LOW (ref 7.350–7.450)
pO2, Arterial: 580 mmHg — ABNORMAL HIGH (ref 83.0–108.0)

## 2021-08-01 LAB — MRSA NEXT GEN BY PCR, NASAL: MRSA by PCR Next Gen: DETECTED — AB

## 2021-08-01 LAB — HIV ANTIBODY (ROUTINE TESTING W REFLEX): HIV Screen 4th Generation wRfx: NONREACTIVE

## 2021-08-01 IMAGING — DX DG CHEST 1V PORT
1 series · 1 of 1 positions shown · non-contrast
Comparison: [DATE]

CLINICAL DATA: Intubation, endotracheal tube placement

EXAM:
PORTABLE CHEST 1 VIEW

[chest]
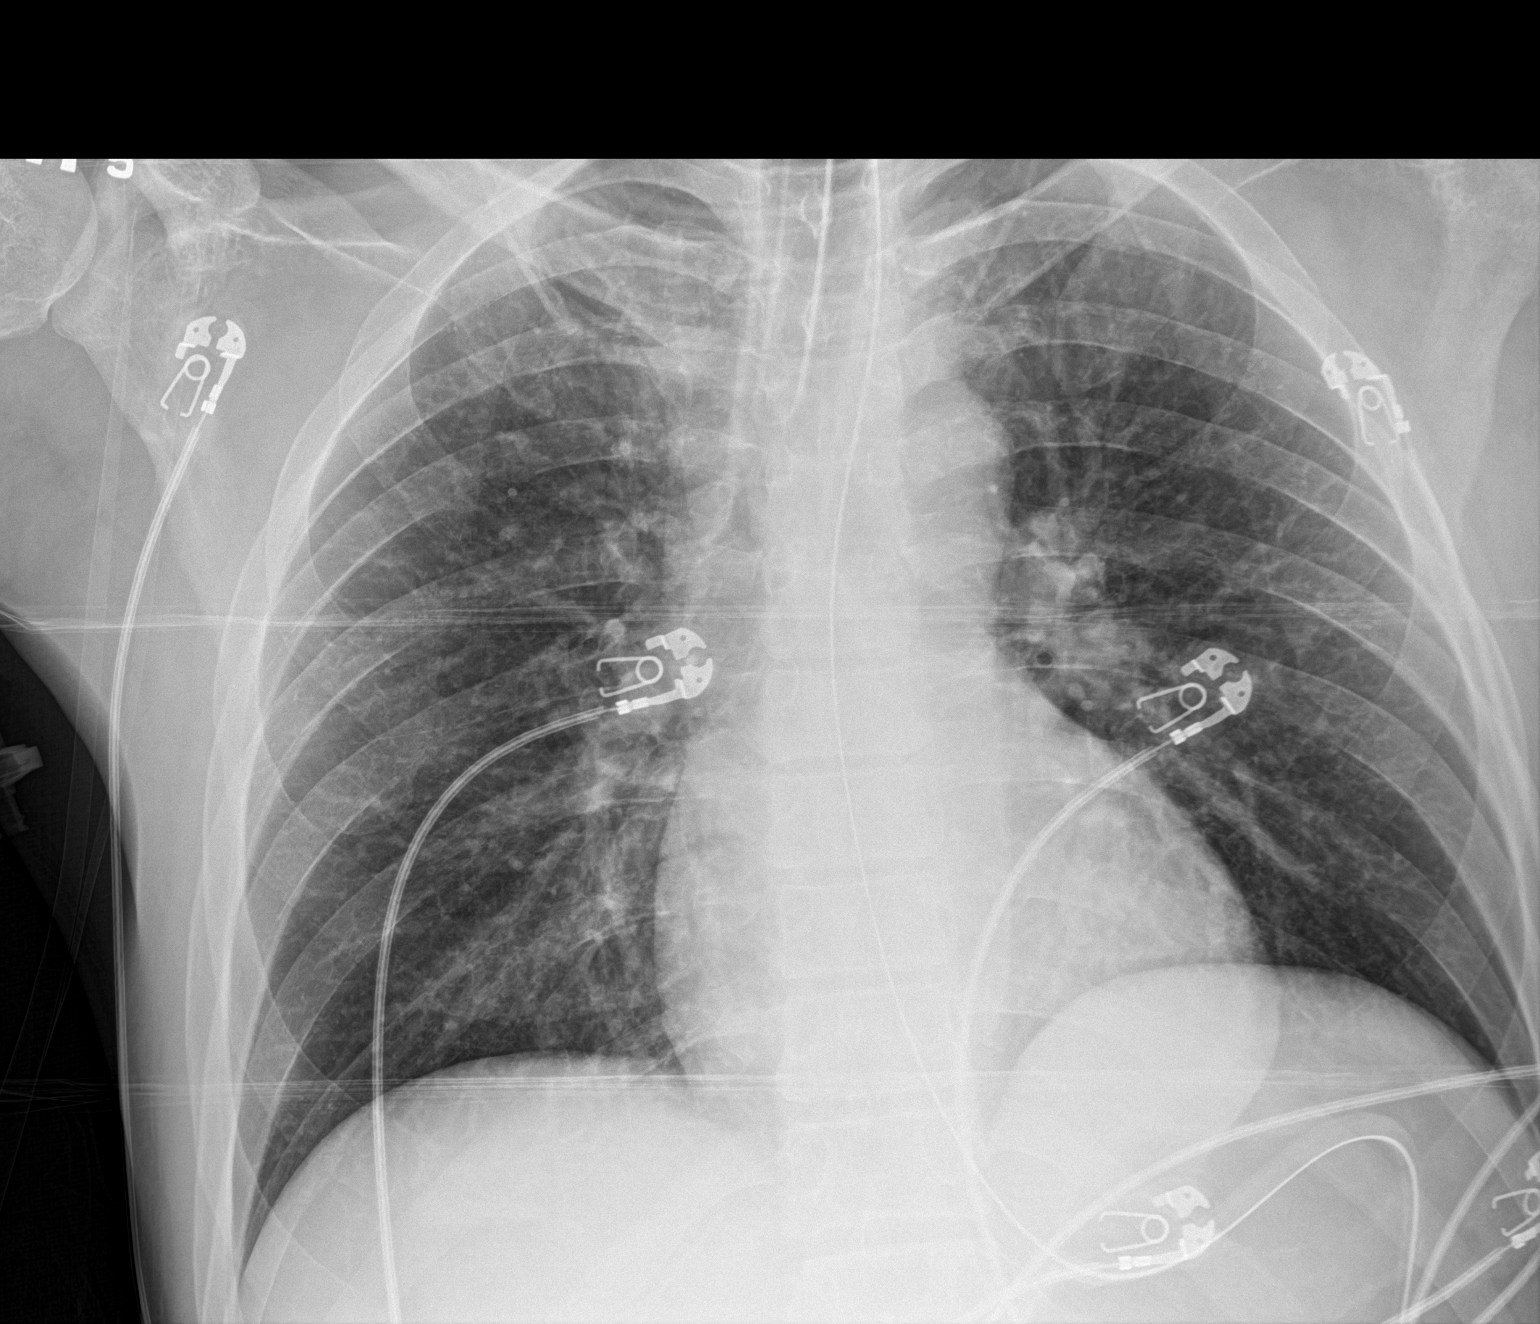

[1 of 1 positions shown; findings below may reference images not displayed]

FINDINGS: Interval placement of endotracheal tube, tip above the carina.
Esophagogastric tube with tip and side port below the diaphragm. No
acute abnormality of the lungs. The heart and mediastinum are
normal.
IMPRESSION: 1. Interval placement of endotracheal tube, appropriately positioned
with tip above the carina. Esophagogastric tube with tip and side
port below the diaphragm.
2. No acute abnormality of the lungs.

## 2021-08-01 MED ORDER — PROPOFOL 1000 MG/100ML IV EMUL
5.0000 ug/kg/min | INTRAVENOUS | Status: DC
  Administered 2021-08-01 (×2): 10 ug/kg/min via INTRAVENOUS

## 2021-08-01 MED ORDER — KETAMINE HCL 50 MG/5ML IJ SOSY
PREFILLED_SYRINGE | INTRAMUSCULAR | Status: AC
  Filled 2021-08-01: qty 10

## 2021-08-01 MED ORDER — DEXTROSE-NACL 5-0.9 % IV SOLN: INTRAVENOUS | Status: DC

## 2021-08-01 MED ORDER — MIDAZOLAM HCL 2 MG/2ML IJ SOLN
INTRAMUSCULAR | Status: AC
  Administered 2021-08-01: 4 mg via INTRAVENOUS
  Filled 2021-08-01: qty 4

## 2021-08-01 MED ORDER — DROPERIDOL 2.5 MG/ML IJ SOLN
2.5000 mg | Freq: Once | INTRAMUSCULAR | Status: AC
  Administered 2021-08-01: 2.5 mg via INTRAVENOUS

## 2021-08-01 MED ORDER — POTASSIUM CHLORIDE 20 MEQ PO PACK: 40.0000 meq | PACK | Freq: Once | ORAL | Status: DC

## 2021-08-01 MED ORDER — FENTANYL 2500MCG IN NS 250ML (10MCG/ML) PREMIX INFUSION
50.0000 ug/h | INTRAVENOUS | Status: DC
  Administered 2021-08-01: 100 ug/h via INTRAVENOUS
  Filled 2021-08-01: qty 250

## 2021-08-01 MED ORDER — HEPARIN SODIUM (PORCINE) 5000 UNIT/ML IJ SOLN
5000.0000 [IU] | Freq: Three times a day (TID) | INTRAMUSCULAR | Status: DC
  Administered 2021-08-01: 5000 [IU] via SUBCUTANEOUS
  Filled 2021-08-01: qty 1

## 2021-08-01 MED ORDER — MIDAZOLAM HCL 2 MG/2ML IJ SOLN: 2.0000 mg | INTRAMUSCULAR | Status: DC | PRN

## 2021-08-01 MED ORDER — PANTOPRAZOLE SODIUM 40 MG IV SOLR
40.0000 mg | Freq: Every day | INTRAVENOUS | Status: DC
  Administered 2021-08-01 – 2021-08-02 (×2): 40 mg via INTRAVENOUS
  Filled 2021-08-01 (×2): qty 40

## 2021-08-01 MED ORDER — FENTANYL 2500MCG IN NS 250ML (10MCG/ML) PREMIX INFUSION
50.0000 ug/h | INTRAVENOUS | Status: DC
  Administered 2021-08-01 (×2): 50 ug/h via INTRAVENOUS
  Filled 2021-08-01: qty 250

## 2021-08-01 MED ORDER — IOHEXOL 350 MG/ML SOLN
80.0000 mL | Freq: Once | INTRAVENOUS | Status: AC | PRN
  Administered 2021-08-01: 80 mL via INTRAVENOUS

## 2021-08-01 MED ORDER — FENTANYL BOLUS VIA INFUSION
50.0000 ug | INTRAVENOUS | Status: DC | PRN
Start: 2021-08-01 — End: 2021-08-01
  Filled 2021-08-01: qty 100

## 2021-08-01 MED ORDER — INSULIN ASPART 100 UNIT/ML IJ SOLN
0.0000 [IU] | INTRAMUSCULAR | Status: DC
Start: 2021-08-01 — End: 2021-08-02

## 2021-08-01 MED ORDER — THIAMINE HCL 100 MG/ML IJ SOLN
100.0000 mg | Freq: Every day | INTRAMUSCULAR | Status: DC
  Administered 2021-08-01 – 2021-08-04 (×4): 100 mg via INTRAVENOUS
  Filled 2021-08-01 (×5): qty 2

## 2021-08-01 MED ORDER — MIDAZOLAM HCL 2 MG/2ML IJ SOLN: 4.0000 mg | Freq: Once | INTRAMUSCULAR | Status: AC

## 2021-08-01 MED ORDER — MIDAZOLAM HCL 2 MG/2ML IJ SOLN
INTRAMUSCULAR | Status: AC
  Filled 2021-08-01: qty 10

## 2021-08-01 MED ORDER — ETOMIDATE 2 MG/ML IV SOLN
20.0000 mg | Freq: Once | INTRAVENOUS | Status: AC
  Administered 2021-08-01: 20 mg via INTRAVENOUS

## 2021-08-01 MED ORDER — FENTANYL BOLUS VIA INFUSION
50.0000 ug | INTRAVENOUS | Status: DC | PRN
  Administered 2021-08-01: 100 ug via INTRAVENOUS
  Administered 2021-08-02: 50 ug via INTRAVENOUS
  Filled 2021-08-01: qty 100

## 2021-08-01 MED ORDER — POTASSIUM CHLORIDE 10 MEQ/100ML IV SOLN
10.0000 meq | INTRAVENOUS | Status: AC
  Administered 2021-08-01 (×4): 10 meq via INTRAVENOUS
  Filled 2021-08-01 (×4): qty 100

## 2021-08-01 MED ORDER — PROPOFOL 1000 MG/100ML IV EMUL
0.0000 ug/kg/min | INTRAVENOUS | Status: DC
  Administered 2021-08-01 (×2): 50 ug/kg/min via INTRAVENOUS
  Administered 2021-08-02 (×2): 40 ug/kg/min via INTRAVENOUS
  Administered 2021-08-02: 30 ug/kg/min via INTRAVENOUS
  Filled 2021-08-01 (×4): qty 100

## 2021-08-01 MED ORDER — SODIUM CHLORIDE 0.9 % IV BOLUS
1000.0000 mL | Freq: Once | INTRAVENOUS | Status: AC
  Administered 2021-08-01: 1000 mL via INTRAVENOUS

## 2021-08-01 MED ORDER — POLYETHYLENE GLYCOL 3350 17 G PO PACK: 17.0000 g | PACK | Freq: Every day | ORAL | Status: DC | PRN

## 2021-08-01 MED ORDER — CHLORHEXIDINE GLUCONATE 0.12% ORAL RINSE (MEDLINE KIT)
15.0000 mL | Freq: Two times a day (BID) | OROMUCOSAL | Status: DC
  Administered 2021-08-01 – 2021-08-02 (×3): 15 mL via OROMUCOSAL

## 2021-08-01 MED ORDER — FENTANYL CITRATE (PF) 100 MCG/2ML IJ SOLN
50.0000 ug | Freq: Once | INTRAMUSCULAR | Status: DC
Start: 2021-08-01 — End: 2021-08-02

## 2021-08-01 MED ORDER — ROCURONIUM BROMIDE 50 MG/5ML IV SOLN
100.0000 mg | Freq: Once | INTRAVENOUS | Status: AC
  Administered 2021-08-01: 100 mg via INTRAVENOUS

## 2021-08-01 MED ORDER — CHLORHEXIDINE GLUCONATE CLOTH 2 % EX PADS
6.0000 | MEDICATED_PAD | Freq: Every day | CUTANEOUS | Status: DC
  Administered 2021-08-02 – 2021-08-04 (×2): 6 via TOPICAL

## 2021-08-01 MED ORDER — DOCUSATE SODIUM 50 MG/5ML PO LIQD: 100.0000 mg | Freq: Two times a day (BID) | ORAL | Status: DC | PRN

## 2021-08-01 MED ORDER — HEPARIN SODIUM (PORCINE) 5000 UNIT/ML IJ SOLN
5000.0000 [IU] | Freq: Three times a day (TID) | INTRAMUSCULAR | Status: DC
Start: 2021-08-02 — End: 2021-08-05
  Administered 2021-08-02 – 2021-08-04 (×7): 5000 [IU] via SUBCUTANEOUS
  Filled 2021-08-01 (×8): qty 1

## 2021-08-01 MED ORDER — ORAL CARE MOUTH RINSE
15.0000 mL | OROMUCOSAL | Status: DC
  Administered 2021-08-01 – 2021-08-03 (×11): 15 mL via OROMUCOSAL

## 2021-08-01 MED ORDER — MUPIROCIN 2 % EX OINT
1.0000 "application " | TOPICAL_OINTMENT | Freq: Two times a day (BID) | CUTANEOUS | Status: DC
  Administered 2021-08-01 – 2021-08-05 (×9): 1 via NASAL
  Filled 2021-08-01 (×2): qty 22

## 2021-08-01 MED ORDER — FENTANYL CITRATE PF 50 MCG/ML IJ SOSY
50.0000 ug | PREFILLED_SYRINGE | Freq: Once | INTRAMUSCULAR | Status: AC
  Administered 2021-08-01: 50 ug via INTRAVENOUS
  Filled 2021-08-01: qty 1

## 2021-08-01 MED ORDER — FOLIC ACID 1 MG PO TABS
1.0000 mg | ORAL_TABLET | Freq: Every day | ORAL | Status: DC
  Administered 2021-08-01 – 2021-08-05 (×5): 1 mg
  Filled 2021-08-01 (×6): qty 1

## 2021-08-01 NOTE — H&P (Signed)
NAME:  Danny Turner, MRN:  [id number], DOB:  02-Feb-1991, LOS: 0 ADMISSION DATE:  08/01/2021, CONSULTATION DATE:  08/01/21 REFERRING MD:  Tegeler CHIEF COMPLAINT:  Hanging   History of Present Illness:  Danny Turner is a 30 y.o. male who has a PMH as below and who is incarcerated at [facility].  He was brought to [facility] 9/9 after he was found hanging in his cell.  Felt to be hanging roughly 15 minutes; though EMS had to administer 2mg  IM Versed due to agitation and pt thrashing.  In ED, he was intubated for airway protection . He had CT angio head / neck that was negative for any fx or acute process.  PCCM asked to see for admission.  Upon our evaluation he was on 10 Propofol and 50 Fentanyl and was completely unresponsive with no cough or gag and sluggish pupillary reflex.  Sedation was stopped by Dr. to assess for any changes in his neuro exam.  Pertinent  Medical History:  has Psychoactive substance-induced psychosis (HCC) and Amphetamine abuse (HCC) on their problem list.  Significant Hospital Events: Including procedures, antibiotic start and stop dates in addition to other pertinent events   9/9 > admit.  Interim History / Subjective:  Completely unresponsive young male.  Was on 10mg  Propofol and 11/9 Fentanyl which was stopped by Dr. on our assessment. Vitals stable.  Objective:  Blood pressure (!) 142/101, pulse (!) 119, resp. rate 18, height 5\' 7"  (1.702 m), weight 79.4 kg, SpO2 95 %.    Vent Mode: PRVC FiO2 (%):  [100 %] 100 % Set Rate:  [16 bmp] 16 bmp Vt Set:  [520 mL] 520 mL PEEP:  [5 cmH20] 5 cmH20 Plateau Pressure:  [11 cmH20] 11 cmH20  No intake or output data in the 24 hours ending 08/01/21 1541 Filed Weights   08/01/21 1437  Weight: 79.4 kg    Examination: General: Young adult male, completely unresponsive. Neuro: Not responsive (was on 10 propofol and 50 fentanyl which has since been stopped). No cough, no gag.  Pupils sluggish. HEENT: [place]/AT.  Sclerae anicteric. ETT in place. Cardiovascular: RRR, no M/R/G.  Lungs: Respirations even and unlabored.  CTA bilaterally, No W/R/R.  Abdomen: BS x 4, soft, NT/ND.  Musculoskeletal: No gross deformities, no edema.  Skin: Intact, warm, no rashes.  Labs/imaging personally reviewed:  CTA head / neck 9/9 >   Resolved Hospital Problem list:   Assessment & Plan:   Intentional Hanging - supposedly 15 minutes in his jail cell; though, on EMS arrival, he was noted to be combative and thrashing requiring 2mg  IM Versed.  CTA head and neck negative for acute injury though pt remains completely unresponsive so far.   Concern for anoxic injury - 2/2 above. - Supportive care.  - Avoid sedation if possible. - Daily neuro checks.  Respiratory insufficiency (POA) - s/p intubation in ED. - Full vent support. - ABG noted, increase RR on vent given resp acidosis and decrease FiO2. - Daily SBT. - Bronchial hygiene. - Follow CXR.  Hypokalemia. - 40 mEq K per tube. - Follow BMP.  Hx Transaminitis on prior admit with positive HCV Ab. - Repeat LFT's. - Repeat hepatitis panel and assess HIV.  Hx polysubstance abuse and methamphetamine dependence. - Assess UDS. - Substance abuse counseling.  Best practice (evaluated daily):  Diet/type: NPO DVT prophylaxis: systemic heparin GI prophylaxis: PPI Lines: N/A Foley:  N/A Code Status:  full code Last date of multidisciplinary goals of  care discussion: None yet.  Labs   CBC: Recent Labs  Lab 08/01/21 1535  HGB 13.9  HCT 41.0    Basic Metabolic Panel: Recent Labs  Lab 08/01/21 1535  NA 141  K 3.2*   GFR: CrCl cannot be calculated (Patient's most recent lab result is older than the maximum 21 days allowed.). No results for input(s): PROCALCITON, WBC, LATICACIDVEN in the last 168 hours.  Liver Function Tests: No results for input(s): AST, ALT, ALKPHOS, BILITOT, PROT, ALBUMIN in the last 168 hours. No results for input(s): LIPASE,  AMYLASE in the last 168 hours. No results for input(s): AMMONIA in the last 168 hours.  ABG    Component Value Date/Time   PHART 7.281 (L) 08/01/2021 1535   PCO2ART 52.9 (H) 08/01/2021 1535   PO2ART 580 (H) 08/01/2021 1535   HCO3 25.0 08/01/2021 1535   TCO2 27 08/01/2021 1535   ACIDBASEDEF 3.0 (H) 08/01/2021 1535   O2SAT 100.0 08/01/2021 1535     Coagulation Profile: No results for input(s): INR, PROTIME in the last 168 hours.  Cardiac Enzymes: No results for input(s): CKTOTAL, CKMB, CKMBINDEX, TROPONINI in the last 168 hours.  HbA1C: No results found for: HGBA1C  CBG: No results for input(s): GLUCAP in the last 168 hours.  Review of Systems:   Unable to obtain as pt is encephalopathic.  Past Medical History:  He,  has no past medical history on file.   Surgical History:   Past Surgical History:  Procedure Laterality Date   APPENDECTOMY     LEG SURGERY Left      Social History:   reports that he has been smoking cigarettes. He has been smoking an average of 1 pack per day. He has never used smokeless tobacco. He reports that he does not currently use alcohol. He reports current drug use. Drug: Methamphetamines.   Family History:  His family history is not on file.   Allergies Allergies  Allergen Reactions   Penicillins      Home Medications  Prior to Admission medications   Not on File     Critical care time: 35 min.   Rutherford Guys, PA - [facility] For pager details, please see AMION or use Epic chat  After 1900, please call [facility] for cross coverage needs 08/01/2021, 3:41 PM

## 2021-08-01 NOTE — Progress Notes (Signed)
1735: Pt arrived from ED extremely agitated in 4pt violent restraints, Propofol an Fentanyl infusing. MD paged. Dr Francine Graven in to help with pt and orders since unable to move pt from gurney d/t agitation. 4mg  Versed given, 4point soft restraints applied, fent and prop gtt ordered.   On initial inspection, pt was found to have redness and abrasions to bilateral wrists where violent restraints were placed along with bilateral ankle. Abrasions and petechia noted along chest, abdomen and face.

## 2021-08-01 NOTE — Progress Notes (Signed)
Patient transported to 2M13 from ED. RN at bedside.

## 2021-08-01 NOTE — ED Notes (Signed)
Violent restraints were pulled from trauma pyxis & applied.

## 2021-08-01 NOTE — ED Provider Notes (Signed)
[facility] EMERGENCY DEPARTMENT Provider Note   CSN: [id number] Arrival date & time: 08/01/21  1429     History Chief Complaint  Patient presents with   Suicide Attempt    Danny Turner is a 30 y.o. male.  Patient brought in from prison chief complaint of hanging.  Found hanging by his neck in his cell.  Per EMS and staff, estimated 15 minutes of hanging.  Patient agitated and combative unable to obtain additional history or review of systems.      History reviewed. No pertinent past medical history.  Patient Active Problem List   Diagnosis Date Noted   Psychoactive substance-induced psychosis (HCC) 06/30/2021   Amphetamine abuse (HCC) 06/30/2021    Past Surgical History:  Procedure Laterality Date   APPENDECTOMY     LEG SURGERY Left        No family history on file.  Social History   Tobacco Use   Smoking status: Every Day    Packs/day: 1.00    Types: Cigarettes   Smokeless tobacco: Never  Substance Use Topics   Alcohol use: Not Currently    Comment: daily use   Drug use: Yes    Types: Methamphetamines    Home Medications Prior to Admission medications   Not on File    Allergies    Penicillins  Review of Systems   Review of Systems  Unable to perform ROS: Mental status change   Physical Exam Updated Vital Signs Ht 5\' 7"  (1.702 m)   Wt 79.4 kg   SpO2 96%   BMI 27.42 kg/m   Physical Exam Constitutional:      Appearance: He is well-developed.     Comments: Combative, thrashing, diaphoretic.  Pupils 3 and sluggish.  HENT:     Head: Normocephalic.     Nose: Nose normal.  Eyes:     Comments: Injected conjunctiva bilaterally pupils sluggish bilaterally 3  Neck:     Comments: Superficial abrasion marks seen on anterior neck. Pulmonary:     Effort: Pulmonary effort is normal.  Abdominal:     General: Abdomen is flat.  Skin:    Coloration: Skin is not jaundiced.  Neurological:     Comments: Agitated, combative,  thrashing, moving all extremities.  Fighting at staff.  Nonverbal.  Not following commands.  Does not appear to be blinking.  No extraocular motion noted.    ED Results / Procedures / Treatments   Labs (all labs ordered are listed, but only abnormal results are displayed) Labs Reviewed  CBC WITH DIFFERENTIAL/PLATELET  COMPREHENSIVE METABOLIC PANEL  RAPID URINE DRUG SCREEN, HOSP PERFORMED  I-STAT CHEM 8, ED  I-STAT ARTERIAL BLOOD GAS, ED    EKG None  Radiology No results found.  Procedures .Critical Care Performed by: , MD Authorized by: Cheryll Cockayne, MD   Critical care provider statement:    Critical care time (minutes):  30   Critical care time was exclusive of:  Separately billable procedures and treating other patients and teaching time   Critical care was necessary to treat or prevent imminent or life-threatening deterioration of the following conditions:  CNS failure or compromise Date/Time: 08/01/2021 3:09 PM Performed by: 10/01/2021, MD Comments: 7.5 ET tube inserted for airway control and therapeutic interventions.  Patient given 20 mg of etomidate 100 mg of rocuronium.  Cords visualized.  Successful first attempt intubation.  Breath sounds equal bilaterally misting in the tube.      Medications Ordered  in ED Medications  sodium chloride 0.9 % bolus 1,000 mL (has no administration in time range)  fentaNYL in NS (43mcg/ml) infusion-PREMIX (50 mcg/hr Intravenous New Bag/Given 08/01/21 1451)  fentaNYL (SUBLIMAZE) bolus via infusion 50-100 mcg (has no administration in time range)  propofol (DIPRIVAN) 1000 MG/100ML infusion (10 mcg/kg/min  79.4 kg Intravenous New Bag/Given 08/01/21 1445)  droperidol (INAPSINE) 2.5 MG/ML injection 2.5 mg (2.5 mg Intravenous Given 08/01/21 1435)  etomidate (AMIDATE) injection 20 mg (20 mg Intravenous Given 08/01/21 1436)  rocuronium (ZEMURON) injection 100 mg (100 mg Intravenous Given 08/01/21 1437)  fentaNYL  (SUBLIMAZE) injection 50 mcg (50 mcg Intravenous Given 08/01/21 1447)  iohexol (OMNIPAQUE) 350 MG/ML injection 80 mL (80 mLs Intravenous Contrast Given 08/01/21 1508)    ED Course  I have reviewed the triage vital signs and the nursing notes.  Pertinent labs & imaging results that were available during my care of the patient were reviewed by me and considered in my medical decision making (see chart for details).    MDM Rules/Calculators/A&P                           Patient arrived combative thrashing, no IV in place.  Given droperidol.  Patient intubated for airway control and therapeutic interventions.  Critical care consulted.  Pending labs and imaging.   Final Clinical Impression(s) / ED Diagnoses Final diagnoses:  Suicide attempt (HCC)  Hanging, undetermined whether accidentally or purposely inflicted, initial encounter    Rx / DC Orders ED Discharge Orders     None        Cheryll Cockayne, MD 08/01/21 1510

## 2021-08-01 NOTE — ED Notes (Signed)
I opened pt's eyes and tested eye threat and pt did not blink and that was while the sedatives had been off for an hour or more

## 2021-08-01 NOTE — ED Notes (Signed)
It was difficult to get a consistent bp on pt with him moving all around

## 2021-08-01 NOTE — ED Triage Notes (Addendum)
Pt arrived via [place] [facility] from [place] [facility]. Pt was found hanging. Pt was aprrox hanging for 15 mins. EMS gave versed 2mg  IM. Pt arrived combative, grunting, thrashing around and on a NRB. Pt is tachypneic and labored breathing.

## 2021-08-01 NOTE — Progress Notes (Signed)
eLink Physician-Brief Progress Note Patient Name: Danny Turner DOB: 1991-04-15 MRN: [id number]   Date of Service  08/01/2021  HPI/Events of Note  Mild elevation of blood glucose.  eICU Interventions  Every 4 hrs SSI ordered     Intervention Category Intermediate Interventions: Hyperglycemia - evaluation and treatment  [name], P 08/01/2021, 8:01 PM

## 2021-08-01 NOTE — Progress Notes (Signed)
Patient transported to CT and back without complications. RN at bedside.  

## 2021-08-02 DIAGNOSIS — T71164D Asphyxiation due to hanging, undetermined, subsequent encounter: Secondary | ICD-10-CM

## 2021-08-02 LAB — GLUCOSE, CAPILLARY
Glucose-Capillary: 45 mg/dL — ABNORMAL LOW (ref 70–99)
Glucose-Capillary: 95 mg/dL (ref 70–99)
Glucose-Capillary: 42 mg/dL — CL (ref 70–99)
Glucose-Capillary: 49 mg/dL — ABNORMAL LOW (ref 70–99)
Glucose-Capillary: 62 mg/dL — ABNORMAL LOW (ref 70–99)
Glucose-Capillary: 51 mg/dL — ABNORMAL LOW (ref 70–99)
Glucose-Capillary: 66 mg/dL — ABNORMAL LOW (ref 70–99)
Glucose-Capillary: 76 mg/dL (ref 70–99)
Glucose-Capillary: 76 mg/dL (ref 70–99)
Glucose-Capillary: 78 mg/dL (ref 70–99)
Glucose-Capillary: 94 mg/dL (ref 70–99)
Glucose-Capillary: 84 mg/dL (ref 70–99)

## 2021-08-02 LAB — HEPATIC FUNCTION PANEL
ALT: 121 U/L — ABNORMAL HIGH (ref 0–44)
AST: 89 U/L — ABNORMAL HIGH (ref 15–41)
Albumin: 3.2 g/dL — ABNORMAL LOW (ref 3.5–5.0)
Alkaline Phosphatase: 26 U/L — ABNORMAL LOW (ref 38–126)
Bilirubin, Direct: 0.2 mg/dL (ref 0.0–0.2)
Indirect Bilirubin: 2.4 mg/dL — ABNORMAL HIGH (ref 0.3–0.9)
Total Bilirubin: 2.6 mg/dL — ABNORMAL HIGH (ref 0.3–1.2)
Total Protein: 5.8 g/dL — ABNORMAL LOW (ref 6.5–8.1)

## 2021-08-02 LAB — BASIC METABOLIC PANEL
Anion gap: 8 (ref 5–15)
BUN: 19 mg/dL (ref 6–20)
CO2: 20 mmol/L — ABNORMAL LOW (ref 22–32)
Calcium: 9.2 mg/dL (ref 8.9–10.3)
Chloride: 112 mmol/L — ABNORMAL HIGH (ref 98–111)
Creatinine, Ser: 1.09 mg/dL (ref 0.61–1.24)
GFR, Estimated: >60 mL/min (ref >60)
Glucose, Bld: 94 mg/dL (ref 70–99)
Potassium: 3.2 mmol/L — ABNORMAL LOW (ref 3.5–5.1)
Sodium: 140 mmol/L (ref 135–145)

## 2021-08-02 LAB — HEPATITIS PANEL, ACUTE
HCV Ab: REACTIVE — AB
Hep A IgM: NONREACTIVE
Hep B C IgM: NONREACTIVE
Hepatitis B Surface Ag: NONREACTIVE

## 2021-08-02 LAB — CBC
HCT: 37 % — ABNORMAL LOW (ref 39.0–52.0)
Hemoglobin: 12.4 g/dL — ABNORMAL LOW (ref 13.0–17.0)
MCH: 29.7 pg (ref 26.0–34.0)
MCHC: 33.5 g/dL (ref 30.0–36.0)
MCV: 88.5 fL (ref 80.0–100.0)
Platelets: 182 10*3/uL (ref 150–400)
RBC: 4.18 MIL/uL — ABNORMAL LOW (ref 4.22–5.81)
RDW: 13.2 % (ref 11.5–15.5)
WBC: 9.2 10*3/uL (ref 4.0–10.5)
nRBC: 0 % (ref 0.0–0.2)

## 2021-08-02 LAB — TRIGLYCERIDES: Triglycerides: 83 mg/dL (ref <150)

## 2021-08-02 LAB — MAGNESIUM: Magnesium: 2.2 mg/dL (ref 1.7–2.4)

## 2021-08-02 LAB — PHOSPHORUS: Phosphorus: 2.6 mg/dL (ref 2.5–4.6)

## 2021-08-02 MED ORDER — POTASSIUM CHLORIDE 20 MEQ PO PACK
40.0000 meq | PACK | Freq: Once | ORAL | Status: AC
  Administered 2021-08-02: 40 meq
  Filled 2021-08-02: qty 2

## 2021-08-02 MED ORDER — VITAL AF 1.2 CAL PO LIQD
1000.0000 mL | ORAL | Status: DC
  Administered 2021-08-02: 1000 mL

## 2021-08-02 MED ORDER — DEXTROSE 50 % IV SOLN
25.0000 mL | Freq: Once | INTRAVENOUS | Status: AC
  Administered 2021-08-02: 25 mL via INTRAVENOUS

## 2021-08-02 MED ORDER — DEXTROSE 50 % IV SOLN: 25.0000 mL | Freq: Once | INTRAVENOUS | Status: AC

## 2021-08-02 MED ORDER — LACTATED RINGERS IV BOLUS
1000.0000 mL | Freq: Once | INTRAVENOUS | Status: AC
  Administered 2021-08-02: 1000 mL via INTRAVENOUS

## 2021-08-02 MED ORDER — VITAL HIGH PROTEIN PO LIQD
1000.0000 mL | ORAL | Status: DC
  Administered 2021-08-02: 1000 mL

## 2021-08-02 MED ORDER — ACETAMINOPHEN 325 MG PO TABS
650.0000 mg | ORAL_TABLET | Freq: Four times a day (QID) | ORAL | Status: DC | PRN
  Administered 2021-08-02 – 2021-08-03 (×4): 650 mg via ORAL
  Filled 2021-08-02 (×4): qty 2

## 2021-08-02 MED ORDER — POTASSIUM CHLORIDE 20 MEQ PO PACK
60.0000 meq | PACK | Freq: Once | ORAL | Status: AC
  Administered 2021-08-02: 60 meq
  Filled 2021-08-02: qty 3

## 2021-08-02 MED ORDER — DEXTROSE 50 % IV SOLN
INTRAVENOUS | Status: AC
  Administered 2021-08-02: 50 mL via INTRAVENOUS
  Filled 2021-08-02: qty 50

## 2021-08-02 MED ORDER — DEXMEDETOMIDINE HCL IN NACL 400 MCG/100ML IV SOLN
0.4000 ug/kg/h | INTRAVENOUS | Status: DC
  Administered 2021-08-02 – 2021-08-03 (×2): 0.4 ug/kg/h via INTRAVENOUS
  Filled 2021-08-02 (×2): qty 100

## 2021-08-02 MED ORDER — DEXTROSE 50 % IV SOLN
INTRAVENOUS | Status: AC
  Administered 2021-08-02: 25 mL via INTRAVENOUS
  Filled 2021-08-02: qty 50

## 2021-08-02 MED ORDER — DEXTROSE 50 % IV SOLN: 50.0000 mL | Freq: Once | INTRAVENOUS | Status: AC

## 2021-08-02 MED ORDER — PROSOURCE TF PO LIQD
45.0000 mL | Freq: Two times a day (BID) | ORAL | Status: DC
  Administered 2021-08-02: 45 mL
  Filled 2021-08-02: qty 45

## 2021-08-02 NOTE — Procedures (Signed)
Extubation Procedure Note  Patient Details:   Name: Danny Turner DOB: 06/20/1991 MRN: [id number]   Airway Documentation:    Vent end date: 08/02/21 Vent end time: 1300   Evaluation  O2 sats: stable throughout Complications: No apparent complications Patient did tolerate procedure well. Bilateral Breath Sounds: Clear, Diminished   Yes  Pt was extubated per MD order and placed on 2 L [place]. Cuff leak was noted prior to extubation and no stridor post. Pt is stable at this time. RT will monitor.   Merlene Laughter 08/02/2021, 1:03 PM

## 2021-08-02 NOTE — Progress Notes (Signed)
eLink Physician-Brief Progress Note Patient Name: Danny Turner DOB: 06-Oct-1991 MRN: [id number]   Date of Service  08/02/2021  HPI/Events of Note  Hypoglycemic episodes noted.  eICU Interventions  Patient placed on D5NS drip     Intervention Category Intermediate Interventions: Other:  [name], P 08/02/2021, 4:36 AM

## 2021-08-02 NOTE — Progress Notes (Addendum)
NAME:  Danny Turner, MRN:  [id number], DOB:  04-06-1991, LOS: 1 ADMISSION DATE:  08/01/2021, CONSULTATION DATE:  08/01/21 REFERRING MD:  Tegeler CHIEF COMPLAINT:  Hanging   History of Present Illness:  Danny Turner is a 30 y.o. male who has a PMH as below and who is incarcerated at [facility].  He was brought to [facility] 9/9 after he was found hanging in his cell.  Felt to be hanging roughly 15 minutes; though EMS had to administer 2mg  IM Versed due to agitation and pt thrashing.  In ED, he was intubated for airway protection . He had CT angio head / neck that was negative for any fx or acute process.  PCCM asked to see for admission.  Upon our evaluation he was on 10 Propofol and 50 Fentanyl and was completely unresponsive with no cough or gag and sluggish pupillary reflex.  Sedation was stopped by Dr. to assess for any changes in his neuro exam.  Pertinent  Medical History:  has Psychoactive substance-induced psychosis (HCC); Amphetamine abuse (HCC); Hanging, undetermined whether accidentally or purposely inflicted; Acute encephalopathy; Acute hypoxemic respiratory failure (HCC); Hypokalemia; and Transaminitis on their problem list.  Significant Hospital Events: Including procedures, antibiotic start and stop dates in addition to other pertinent events   9/9 > admit.  Interim History / Subjective:  Hypoglycemic issues overnight and this morning Borderline soft BP Low UOP, 165 ml/ 12hrs Remains sedated on propofol/ fentanyl, reached for ETT overnight during bath but otherwise has not followed commands or been purposeful Afebrile   Objective:  Blood pressure 94/62, pulse 74, temperature 98.8 F (37.1 C), resp. rate 20, height 5\' 7"  (1.702 m), weight 67.9 kg, SpO2 100 %.    Vent Mode: PRVC FiO2 (%):  [40 %-100 %] 40 % Set Rate:  [16 bmp-20 bmp] 20 bmp Vt Set:  [520 mL] 520 mL PEEP:  [5 cmH20] 5 cmH20 Plateau Pressure:  [11 cmH20-20 cmH20] 20 cmH20   Intake/Output Summary  (Last 24 hours) at 08/02/2021 Last data filed at 08/02/2021 0800 Gross per 24 hour  Intake 2569.98 ml  Output 645 ml  Net 1924.98 ml   Filed Weights   08/01/21 1437 08/02/21 0422  Weight: 79.4 kg 67.9 kg    Examination: General:  critically ill young adult male sedated in bed on propofol 40, fentanyl 150 HEENT: MM pink/moist, ETT, OGT, pinpoint pupils, [place] J collar in place Neuro: sedated, minimal withdrawal in LE to noxious stimuli CV:  NSR, no murmur PULM:  vent supported breaths, CTA GI: soft, bs+, ND, foley  Extremities: warm/dry, no LE edema  Skin: tattoos, multiple dressings to wrist and ankles from abrasions from previous restraints  - officer remains at bedside  Labs/imaging personally reviewed:  CTA head / neck 9/9 >  1. No evidence of acute intracranial abnormality. 2. Widely patent cervical carotid and vertebral arteries. No evidence of acute arterial injury. 3. No major intracranial arterial occlusion, significant stenosis, or aneurysm.  Resolved Hospital Problem list:   Assessment & Plan:   Hanging - supposedly 15 minutes in his jail cell; though, on EMS arrival, he was noted to be combative and thrashing requiring 2mg  IM Versed.  CTA head and neck negative for acute injury  Encephalopathy, possibly multifactorial, metabolic/ toxic and concerning for anoxic injury 2/2 above. Hx polysubstance abuse and methamphetamine dependence - positive for amphetamines and benzos (EMS) on UDS P:  - will wean sedation slowly this morning as patient has previously become  very agitated and combative.  Will add precedex, otherwise continue to wean fentanyl and propofol (wean to off), RASS goal 0/-1 - stabilize glucose as below - continue thiamine/ folate empirically - serial neuro exams - if mental status not improving, will need to consider MRI - Maintain neuro protective measures; goal for eurothermia, euglycemia, eunatermia, normoxia, and PCO2 goal of 35-40 - continue  C-collar  - Substance abuse counseling when appropriate   Respiratory insufficiency secondary to above - Continue MV support, 8cc/kg IBW with goal Pplat <30 and DP<15  - VAP prevention protocol/ PPI - PAD protocol for sedation> as above - wean FiO2 as able for SpO2 >92%  - daily SAT & SBT  Hypokalemia. - KCL per tube f/b 40 meq at 1800 - check Mag, supplement as needed  - trend BMET  Hx Transaminitis on prior admit with positive HCV Ab. - previous RUQ [place] 05/29/21 normal - LFTs better today but still remain elevated, had normal INR 9/9 - HIV neg - pending hepatitis panel, check ammonia   Borderline soft blood pressure - sedation may be contributing, but given low UOP will bolus with 1L LR now - no indications of SIRS, remains afebrile.  H/H stable. - renal function remains stable, watch closely - minimize sedation as able - trend renal indices/ UOP/ I/Os closely  Hypoglycemia - ongoing hypoglycemia overnight.  Will correct with D50 still stable with ongoing MIVF, and then start TF this morning to stabilize glucose. - goal glucose 120-180  Best practice (evaluated daily):  Diet/type: NPO; starting TF DVT prophylaxis: prophylactic heparin SQ GI prophylaxis: PPI Lines: N/A Foley:  Yes, and it is still needed given low UOP Code Status:  full code Last date of multidisciplinary goals of care discussion: remains in police custody at this time.   Labs   CBC: Recent Labs  Lab 08/01/21 1535 08/01/21 1542 08/01/21 1546 08/02/21 0549  WBC  --  17.0*  --  9.2  NEUTROABS  --  14.3*  --   --   HGB 13.9 14.5 14.3 12.4*  HCT 41.0 43.7 42.0 37.0*  MCV  --  90.3  --  88.5  PLT  --  219  --  182    Basic Metabolic Panel: Recent Labs  Lab 08/01/21 1535 08/01/21 1542 08/01/21 1546 08/02/21 0549  NA 141 137 141 140  K 3.2* 3.2* 3.3* 3.2*  CL  --  103 106 112*  CO2  --  20*  --  20*  GLUCOSE  --  211* 207* 94  BUN  --  19 21* 19  CREATININE  --  1.18 1.00 1.09   CALCIUM  --  10.0  --  9.2  MG  --  2.2  --   --   PHOS  --   --   --  2.6   GFR: Estimated Creatinine Clearance: 91.8 mL/min (by C-G formula based on SCr of 1.09 mg/dL). Recent Labs  Lab 08/01/21 1542 08/02/21 0549  WBC 17.0* 9.2    Liver Function Tests: Recent Labs  Lab 08/01/21 1542 08/02/21 0549  AST 91* 89*  ALT 141* 121*  ALKPHOS 35* 26*  BILITOT 2.3* 2.6*  PROT 7.0 5.8*  ALBUMIN 3.9 3.2*   No results for input(s): LIPASE, AMYLASE in the last 168 hours. No results for input(s): AMMONIA in the last 168 hours.  ABG    Component Value Date/Time   PHART 7.281 (L) 08/01/2021 1535   PCO2ART 52.9 (H) 08/01/2021 1535  PO2ART 580 (H) 08/01/2021 1535   HCO3 25.0 08/01/2021 1535   TCO2 21 (L) 08/01/2021 1546   ACIDBASEDEF 3.0 (H) 08/01/2021 1535   O2SAT 100.0 08/01/2021 1535     Coagulation Profile: Recent Labs  Lab 08/01/21 2247  INR 1.1    Cardiac Enzymes: No results for input(s): CKTOTAL, CKMB, CKMBINDEX, TROPONINI in the last 168 hours.  HbA1C: Hgb A1c MFr Bld  Date/Time Value Ref Range Status  08/01/2021 08:01 PM 5.1 4.8 - 5.6 % Final    Comment:    (NOTE) Pre diabetes:          5.7%-6.4%  Diabetes:              >6.4%  Glycemic control for   <7.0% adults with diabetes     CBG: Recent Labs  Lab 08/01/21 2004 08/01/21 2345 08/02/21 0336 08/02/21 0415 08/02/21 0730  GLUCAP 74 70 45* 95 42*     Critical care time: 35 min.      Posey Boyer, ACNP [facility] 08/02/2021, 8:13 AM  See Amion for pager If no response to pager, please call PCCM consult pager After 7:00 pm call Elink    ----------------------------------------------  Attending note: I have seen and examined the patient. History, labs and imaging reviewed.  30 year old with psychosis, substance abuse.  Admitted from prison after he was found hanging in his cell Remains intubated.  Has periods of hypoglycemia overnight, soft blood pressure  and low urine output noted  Blood pressure (!) 103/56, pulse 70, temperature 99.1 F (37.3 C), resp. rate 20, height 5\' 7"  (1.702 m), weight 67.9 kg, SpO2 100 %. Gen:      No acute distress HEENT:  EOMI, sclera anicteric Neck:     No masses; no thyromegaly, ET tube and cervical collar in place Lungs:    Clear to auscultation bilaterally; normal respiratory effort CV:         Regular rate and rhythm; no murmurs Abd:      + bowel sounds; soft, non-tender; no palpable masses, no distension Ext:    No edema; adequate peripheral perfusion Skin:      Warm and dry; no rash Neuro: Sedated  Labs/Imaging personally reviewed, significant for Potassium 3.2, BUN/creatinine 19/1.09 WBC 9.2, hemoglobin 12.4, platelets 182  Assessment/plan: Hanging, presumed suicide attempt Delirium, encephalopathy Continue Precedex, wean off propofol.  Monitor neuro status.  Consider MRI if there is no improvement  Acute respiratory failure due to above Continue vent support for now, spontaneous breathing trials when mental status improves  Hypoglycemia Start tube feeds, continue dextrose  Have not informed the family yet on his admission. Discussed with the prison authorities. They will handle the communications and want to hold off unless he takes a turn for worse.   The patient is critically ill with multiple organ systems failure and requires high complexity decision making for assessment and support, frequent evaluation and titration of therapies, application of advanced monitoring technologies and extensive interpretation of multiple databases.  Critical care time - 35 mins. This represents my time independent of the NPs time taking care of the pt.  12-19-2000 MD [facility] 08/02/2021, 10:51 AM

## 2021-08-02 NOTE — Progress Notes (Signed)
Hypoglycemic Event  CBG: 45  Treatment: D50 50 mL (25 gm)  Symptoms: None  Follow-up CBG: Time:0415 CBG Result:95  Possible Reasons for Event: Inadequate meal intake  Comments/MD notified: Katrinka Blazing, rate adjusted on D5 infusion    Alen Bleacher

## 2021-08-02 NOTE — Progress Notes (Signed)
Initial Nutrition Assessment  DOCUMENTATION CODES:   Not applicable  INTERVENTION:   Initiate tube feeding via OG tube: Vital AF 1.2 at 30 ml/h, increase by 10 ml every 4 hours to goal rate of 70 ml/h (1680 ml per day)  Provides 2016 kcal, 126 gm protein, 1362 ml free water daily  Monitor magnesium, potassium, and phosphorus BID for at least 2 days, MD to replete as needed, as pt is at risk for refeeding syndrome given significant weight loss with suspected poor intake PTA.  NUTRITION DIAGNOSIS:   Inadequate oral intake related to inability to eat as evidenced by NPO status.  GOAL:   Patient will meet greater than or equal to 90% of their needs  MONITOR:   Vent status, TF tolerance, Labs  REASON FOR ASSESSMENT:   Ventilator, Consult Enteral/tube feeding initiation and management  ASSESSMENT:   30 yo male admitted from [facility] after being found hanging in his cell. PMH includes psychoactive substance-induced psychosis, amphetamine abuse, smoker.  Intubated in the ED. CT angio head/neck in ED was negative for any fx or acute process.  Received MD Consult for TF initiation and management for glucose stabilization. OG tube in place.    Patient is currently intubated on ventilator support MV: 10.2 L/min Temp (24hrs), Avg:99.3 F (37.4 C), Min:97.7 F (36.5 C), Max:100.6 F (38.1 C)  Propofol: 14 ml/hr providing 370 kcal from lipid.  Labs reviewed. K 3.2 CBG: [phone]  Medications reviewed and include folic acid, protonix, Klor-Con, thiamine, Precedex, propofol.  Weight history reviewed. Wt Readings from Last 3 Encounters:  08/02/21 67.9 kg  06/02/21 79.4 kg  03/12/21 83.9 kg   Weight is down by by 19% within the past 6 months, which is severe. Suspect poor intake PTA r/t polysubstance abuse.   Patient is at increased nutrition risk given recent severe weight loss.  NUTRITION - FOCUSED PHYSICAL EXAM:  Unable to complete  Diet Order:   Diet Order              Diet NPO time specified  Diet effective now                   EDUCATION NEEDS:   Not appropriate for education at this time  Skin:  Skin Assessment: Reviewed RN Assessment  Last BM:  no BM documented  Height:   Ht Readings from Last 1 Encounters:  08/01/21 5\' 7"  (1.702 m)    Weight:   Wt Readings from Last 1 Encounters:  08/02/21 67.9 kg    Ideal Body Weight:  67.3 kg  BMI:  Body mass index is 23.45 kg/m.  Estimated Nutritional Needs:   Kcal:  2000  Protein:  100-120 gm  Fluid:  >/= 2 L   2001, RD, LDN, CNSC Please refer to Amion for contact information.

## 2021-08-02 NOTE — Progress Notes (Signed)
1946 came into room as sitter after rounds due to no relief.

## 2021-08-02 NOTE — Progress Notes (Signed)
PCCM Progression Note   Patient requesting his c-collar off.  Initial CTA head and neck negative for acute findings.   Pt examined.  He is currently alert and oriented to person, place and time but does not recall events leading up to hospitalization.  He has no focal neuro deficits, sensation intact.  Posterior c-collar removed while holding cspine and pt did complain of tenderness with cervical spinal palpitation.     Therefore, c-collar reapplied fully and he will need a cervical MRI which has been ordered.        Posey Boyer, ACNP [facility] 08/02/2021, 5:58 PM  See Amion for pager If no response to pager, please call PCCM consult pager After 7:00 pm call Elink

## 2021-08-03 ENCOUNTER — Inpatient Hospital Stay (HOSPITAL_COMMUNITY): Payer: Self-pay

## 2021-08-03 LAB — GLUCOSE, CAPILLARY
Glucose-Capillary: 100 mg/dL — ABNORMAL HIGH (ref 70–99)
Glucose-Capillary: 101 mg/dL — ABNORMAL HIGH (ref 70–99)
Glucose-Capillary: 111 mg/dL — ABNORMAL HIGH (ref 70–99)
Glucose-Capillary: 122 mg/dL — ABNORMAL HIGH (ref 70–99)
Glucose-Capillary: 76 mg/dL (ref 70–99)
Glucose-Capillary: 93 mg/dL (ref 70–99)
Glucose-Capillary: 95 mg/dL (ref 70–99)

## 2021-08-03 LAB — COMPREHENSIVE METABOLIC PANEL WITH GFR
ALT: 99 U/L — ABNORMAL HIGH (ref 0–44)
AST: 71 U/L — ABNORMAL HIGH (ref 15–41)
Albumin: 3 g/dL — ABNORMAL LOW (ref 3.5–5.0)
Alkaline Phosphatase: 23 U/L — ABNORMAL LOW (ref 38–126)
Anion gap: 6 (ref 5–15)
BUN: 10 mg/dL (ref 6–20)
CO2: 21 mmol/L — ABNORMAL LOW (ref 22–32)
Calcium: 8.4 mg/dL — ABNORMAL LOW (ref 8.9–10.3)
Chloride: 111 mmol/L (ref 98–111)
Creatinine, Ser: 0.78 mg/dL (ref 0.61–1.24)
GFR, Estimated: >60 mL/min
Glucose, Bld: 89 mg/dL (ref 70–99)
Potassium: 3.7 mmol/L (ref 3.5–5.1)
Sodium: 138 mmol/L (ref 135–145)
Total Bilirubin: 2.7 mg/dL — ABNORMAL HIGH (ref 0.3–1.2)
Total Protein: 5.6 g/dL — ABNORMAL LOW (ref 6.5–8.1)

## 2021-08-03 IMAGING — DX DG CHEST 1V PORT
1 series · 1 of 1 positions shown · non-contrast
Comparison: [DATE]

CLINICAL DATA: Respiratory failure.  Suicide attempt.

EXAM:
PORTABLE CHEST 1 VIEW

[chest ap]
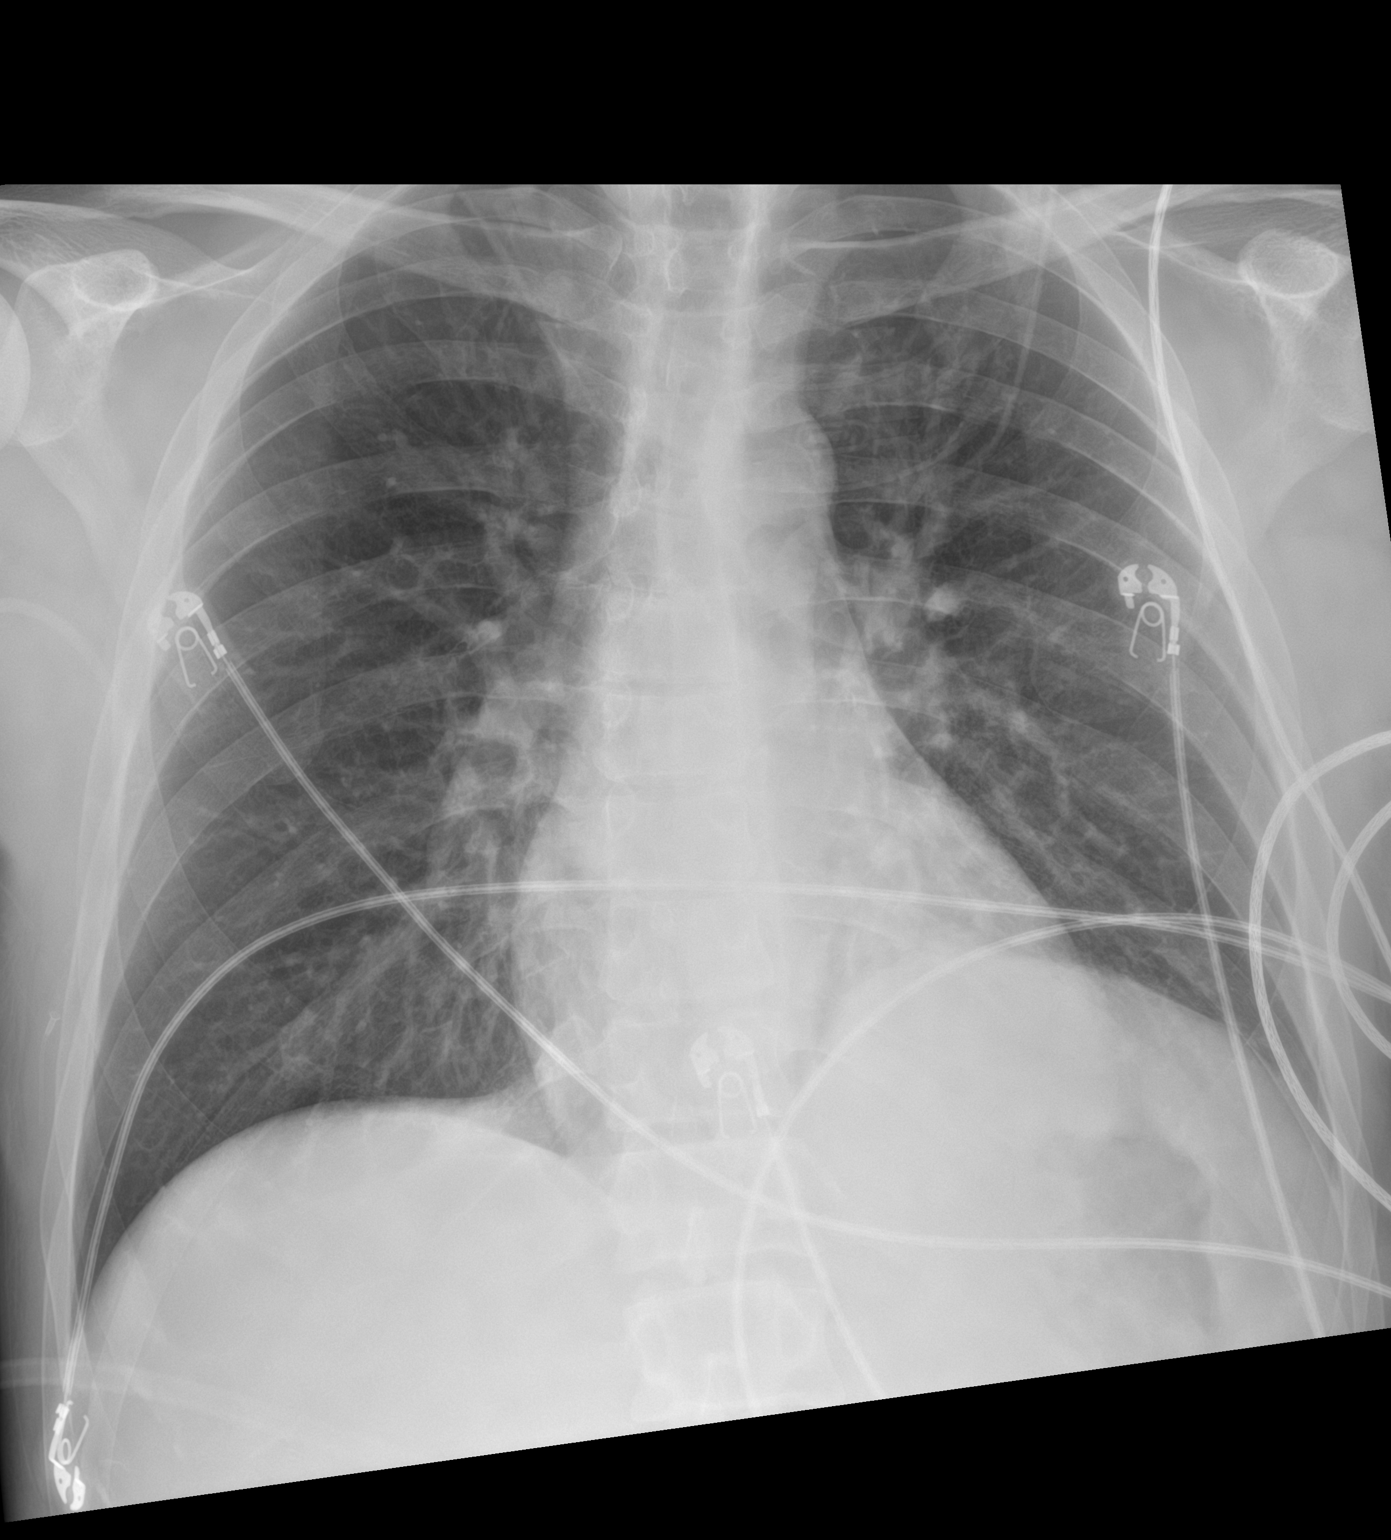

[1 of 1 positions shown; findings below may reference images not displayed]

FINDINGS: Numerous leads and wires project over the chest. Midline trachea.
Normal heart size. No pleural effusion or pneumothorax. Extubation
and removal of nasogastric tube. Diffuse peribronchial thickening.
Suspect medial left lower lobe volume loss and retrocardiac airspace
disease.
IMPRESSION: Left lower lobe volume loss and airspace disease, most likely
atelectasis. Cannot exclude early or mild infection.

Peribronchial thickening which may relate to chronic bronchitis or
smoking.

## 2021-08-03 IMAGING — MR MR CERVICAL SPINE W/O CM
4 of 6 series · 19 of 48 positions shown · non-contrast
Comparison: CTA neck dated [DATE]. CT cervical spine
dated [DATE].

CLINICAL DATA: Neck trauma.  Found hanging.

EXAM:
MRI CERVICAL SPINE WITHOUT CONTRAST
TECHNIQUE: Multiplanar, multisequence MR imaging of the cervical spine was
performed. No intravenous contrast was administered.

[Series 4: STIR · sagittal · 3.0mm · 0.43mm/px · 3 of 18 slices shown]
[im 1/18]
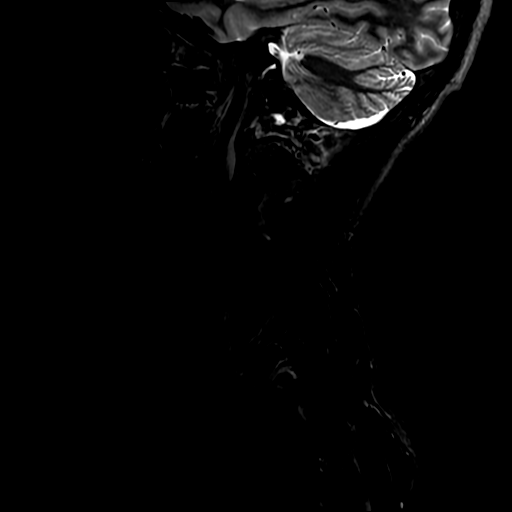
[im 12/18]
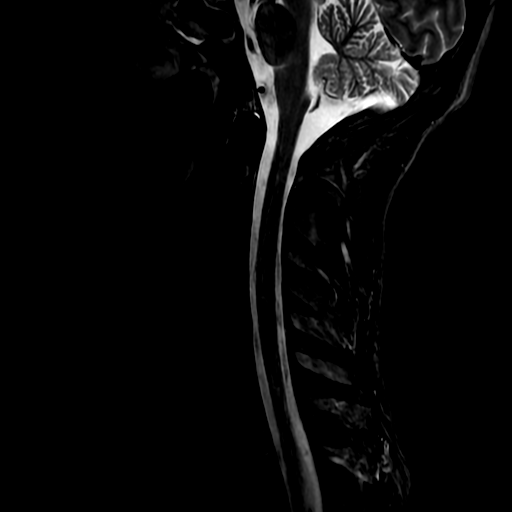
[im 18/18]
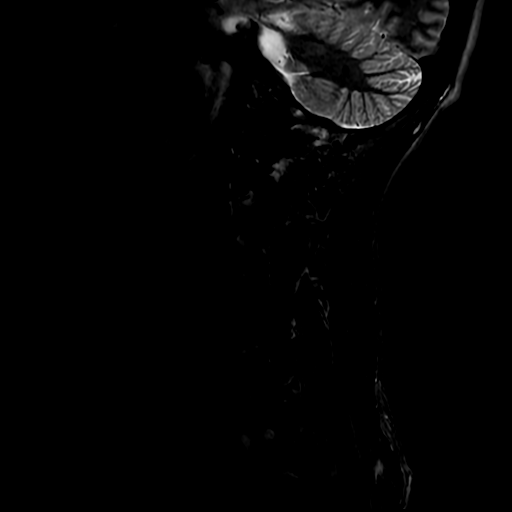

[Series 6: T2 · sagittal · 3.0mm · 0.43mm/px · 3 of 18 slices shown (1 of 2)]
[im 1/18]
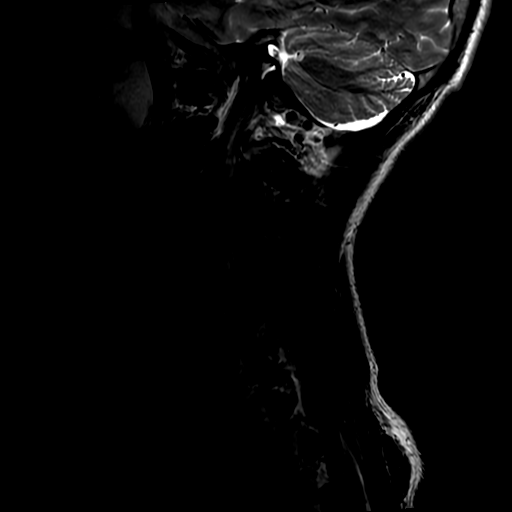
[im 9/18]
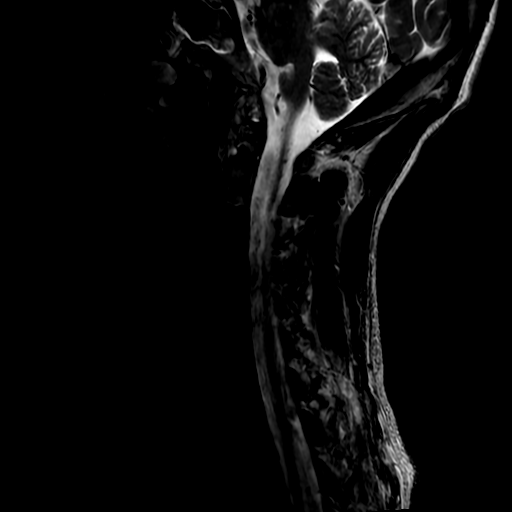
[im 18/18]
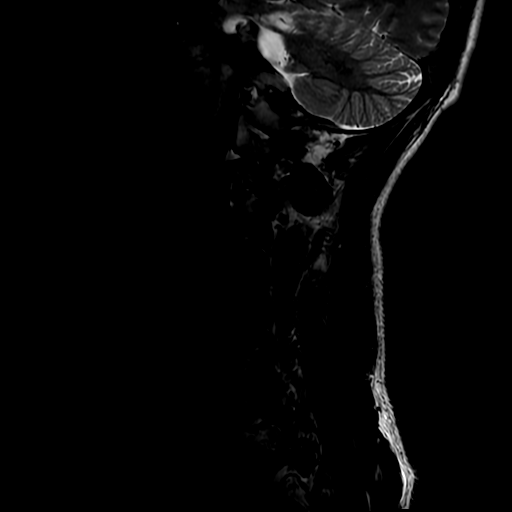

[Series 8: T2 · axial · 3.0mm · 0.35mm/px · z∈[-167,-38]mm · 8 of 42 slices shown (2 of 2)]
[im 1/42]
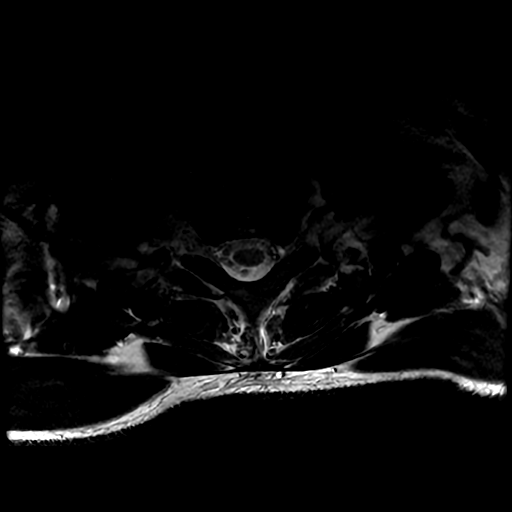
[im 6/42]
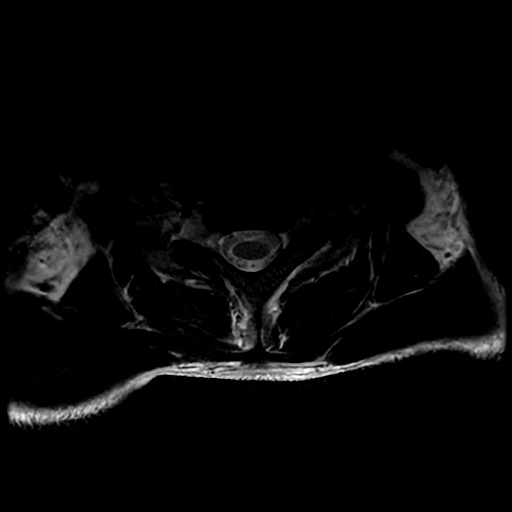
[im 12/42]
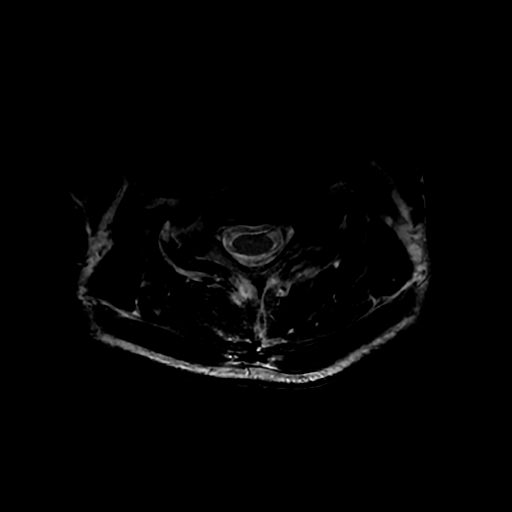
[im 18/42]
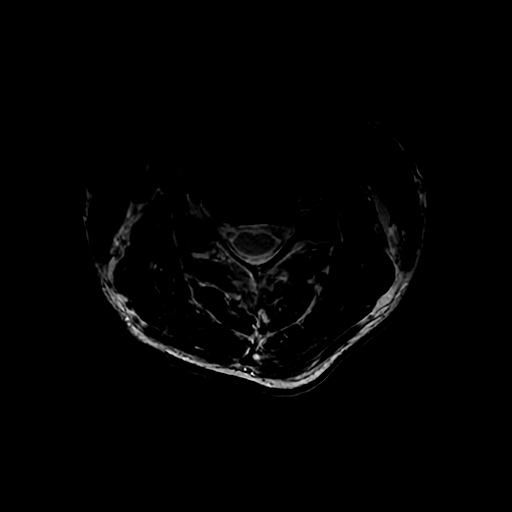
[im 24/42]
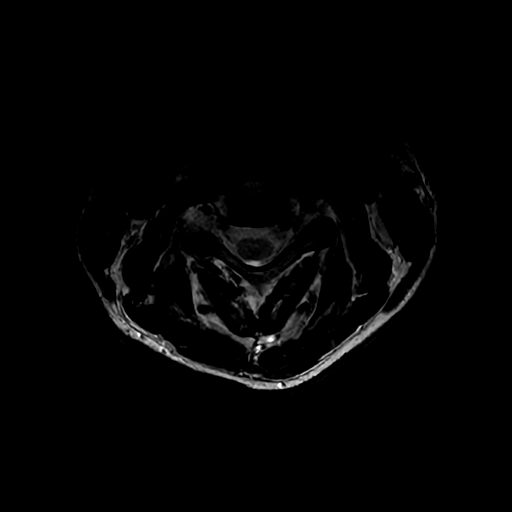
[im 30/42]
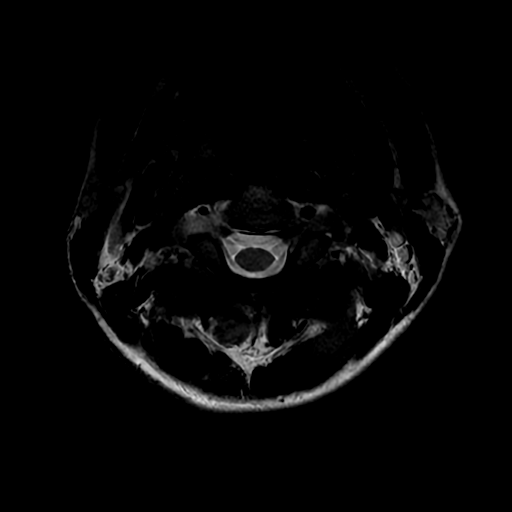
[im 36/42]
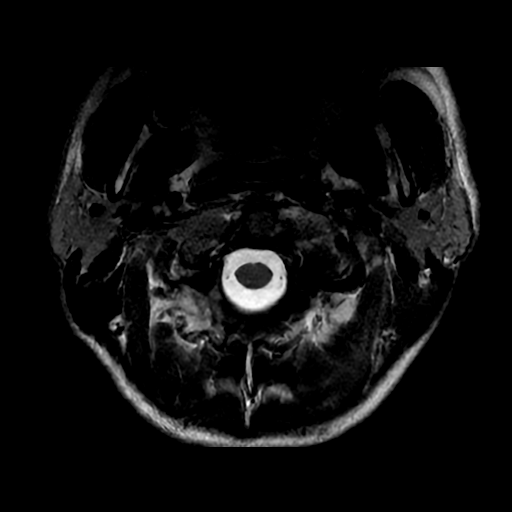
[im 42/42]
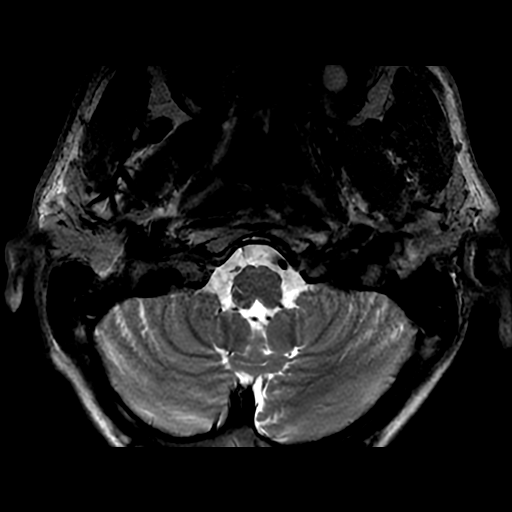

[Series 9: T1 · axial · non-contrast · 3.0mm · 0.35mm/px · z∈[-167,-57]mm · 5 of 42 slices shown]
[im 1/42]
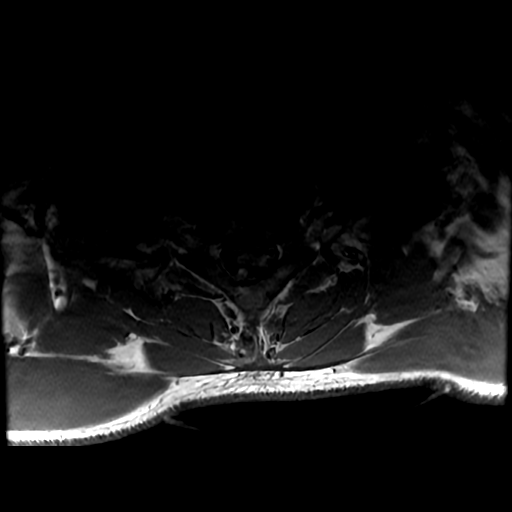
[im 6/42]
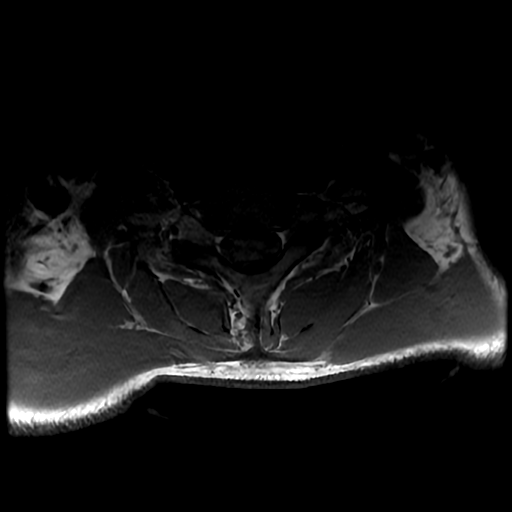
[im 12/42]
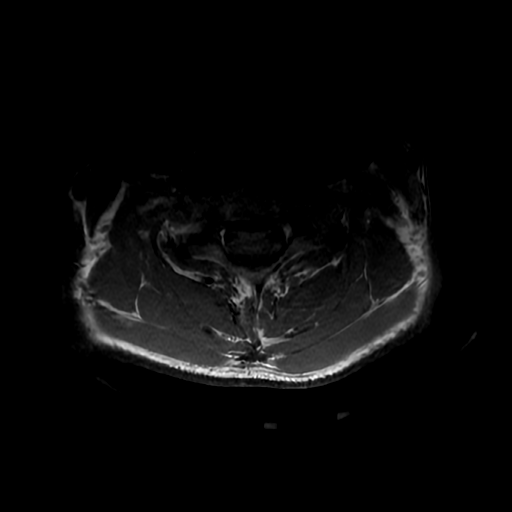
[im 24/42]
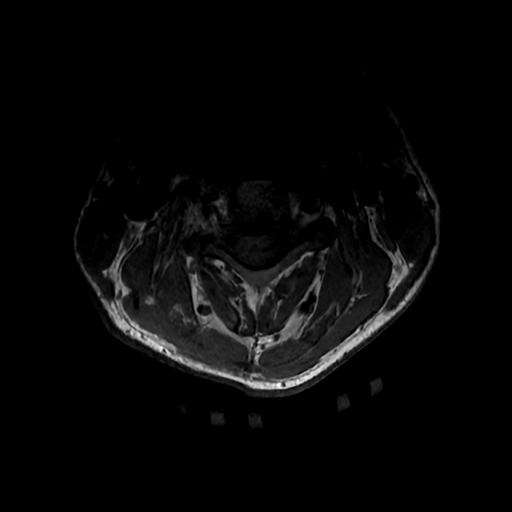
[im 36/42]
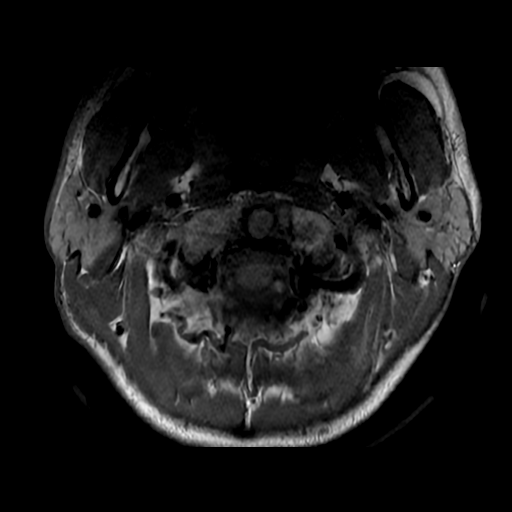

[19 of 48 positions shown; findings below may reference images not displayed]

FINDINGS: Alignment: Physiologic.

Vertebrae: No fracture, evidence of discitis, or bone lesion.

Cord: Normal signal and morphology.

Posterior Fossa, vertebral arteries, paraspinal tissues: Diffuse
interspinous edema. Otherwise negative.

Disc levels:

No significant disc bulge or herniation.  No stenosis.
IMPRESSION: 1. No acute traumatic injury of the cervical spine.
2. Diffuse interspinous edema, likely reflecting ligamentous strain.

## 2021-08-03 MED ORDER — POTASSIUM CHLORIDE CRYS ER 20 MEQ PO TBCR
40.0000 meq | EXTENDED_RELEASE_TABLET | Freq: Once | ORAL | Status: AC
  Administered 2021-08-03: 40 meq via ORAL
  Filled 2021-08-03: qty 2

## 2021-08-03 NOTE — Consult Note (Signed)
[facility] Face-to-Face Psychiatry Consult   Reason for Consult:  suicide attempt by hanging Referring Physician:  Dr. Vaughan Browner Patient Identification: Danny Turner MRN:  [id number] Principal Diagnosis: <principal problem not specified> Diagnosis:  Active Problems:   Hanging, undetermined whether accidentally or purposely inflicted   Total Time spent with patient: 20 minutes  Subjective:   Danny Turner is a 30 y.o. male with a history of IV drug use, methamphetamine abuse depression patient who was admitted to the hospital after he was found hanging from his cell at [facility] jail; it was estimated he was hanging for about 15 minutes. Patient interviewed this AM, he is calm and cooperative although a poor historian. Patient is AAO to hospital, city, month and year but not to situation. Patient states that he "doesn't remember" what occurred that led to hospitalization, although he recalls that he has been in the jail for ~25 days. When asked what led to his incarceration, he mumbles incomprehensibly but states its a "sad story". He reports his mood as "ok" although reports being treated with zoloft in the past. He denies SI/HI/AVH. When he is informed that his reason for hospitalization is that he was found hanging in his cell, he denies and states that "they're lying"; however, is unable to provide a reason why he was brought to the hospital from jail.  I spoke to officer at bedside who reports that from what he understands, the patient was found hanging in his cell. He is not aware of patient making suicidal comments although was informed that he had asked for "mental health medications" somewhat recently although was never started on any as he had to be evaluated by a staff physician.   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o. male who has a PMH as below and who is incarcerated at [facility].  He was brought to [facility] 9/9 after he was found hanging in his cell.  Felt to be hanging roughly 15 minutes; though EMS had to administer 39m IM Versed due to agitation and pt thrashing.   In ED, he was intubated for airway protection . He had CT angio head / neck that was negative for any fx or acute process.   PCCM asked to see for admission.  Upon our evaluation he was on 10 Propofol and 50 Fentanyl and was completely unresponsive with no cough or gag and sluggish pupillary reflex.  Sedation was stopped by Dr. CTacy Learnto assess for any changes in his neuro exam.   Risk to Self:  yes Risk to Others:  no Prior Inpatient Therapy:  yes Prior Outpatient Therapy:  yes  Past Medical History: History reviewed. No pertinent  past medical history.  Past Surgical History:  Procedure Laterality Date   APPENDECTOMY     LEG SURGERY Left    Family History: No family history on file.  Social History:  Social History   Substance and Sexual Activity  Alcohol Use Not Currently   Comment: daily use     Social History    Substance and Sexual Activity  Drug Use Yes   Types: Methamphetamines    Social History   Socioeconomic History   Marital status: Single    Spouse name: Not on file   Number of children: Not on file   Years of education: Not on file   Highest education level: Not on file  Occupational History   Not on file  Tobacco Use   Smoking status: Every Day    Packs/day: 1.00    Types: Cigarettes   Smokeless tobacco: Never  Substance and Sexual Activity   Alcohol use: Not Currently    Comment: daily use   Drug use: Yes    Types: Methamphetamines   Sexual activity: Not on file  Other Topics Concern   Not on file  Social History Narrative   Not on file   Social Determinants of Health   Financial Resource Strain: Not on file  Food Insecurity: Not on file  Transportation Needs: Not on file  Physical Activity: Not on file  Stress: Not on file  Social Connections: Not on file   Additional Social History:    Allergies:   Allergies  Allergen Reactions   Penicillins     Labs:  Results for orders placed or performed during the hospital encounter of 08/01/21 (from the past 48 hour(s))  I-Stat arterial blood gas, [facility] ED only)     Status: Abnormal   Collection Time: 08/01/21  3:35 PM  Result Value Ref Range   pH, Arterial 7.281 (L) 7.350 - 7.450   pCO2 arterial 52.9 (H) 32.0 - 48.0 mmHg   pO2, Arterial 580 (H) 83.0 - 108.0 mmHg   Bicarbonate 25.0 20.0 - 28.0 mmol/L   TCO2 27 22 - 32 mmol/L   O2 Saturation 100.0 %   Acid-base deficit 3.0 (H) 0.0 - 2.0 mmol/L   Sodium 141 135 - 145 mmol/L   Potassium 3.2 (L) 3.5 - 5.1 mmol/L   Calcium, Ion 1.37 1.15 - 1.40 mmol/L   HCT 41.0 39.0 - 52.0 %   Hemoglobin 13.9 13.0 - 17.0 g/dL   Patient temperature 98.1 F    Collection site Radial    Drawn by RT    Sample type ARTERIAL   CBC with Differential     Status: Abnormal   Collection Time: 08/01/21  3:42 PM  Result Value Ref Range   WBC 17.0 (H) 4.0 - 10.5 K/uL   RBC 4.84 4.22 -  5.81 MIL/uL   Hemoglobin 14.5 13.0 - 17.0 g/dL   HCT 43.7 39.0 - 52.0 %   MCV 90.3 80.0 - 100.0 fL   MCH 30.0 26.0 - 34.0 pg   MCHC 33.2 30.0 - 36.0 g/dL   RDW 12.9 11.5 - 15.5 %   Platelets 219 150 - 400 K/uL   nRBC 0.0 0.0 - 0.2 %   Neutrophils Relative % 84 %   Neutro Abs 14.3 (H) 1.7 - 7.7 K/uL   Lymphocytes Relative 8 %   Lymphs Abs 1.3 0.7 - 4.0 K/uL   Monocytes Relative 6 %   Monocytes Absolute 1.1 (H) 0.1 - 1.0 K/uL   Eosinophils Relative  1 %   Eosinophils Absolute 0.1 0.0 - 0.5 K/uL   Basophils Relative 0 %   Basophils Absolute 0.1 0.0 - 0.1 K/uL   Immature Granulocytes 1 %   Abs Immature Granulocytes 0.20 (H) 0.00 - 0.07 K/uL    Comment: Performed at [facility] [address], [place], [place] [address]  Comprehensive metabolic panel     Status: Abnormal   Collection Time: 08/01/21  3:42 PM  Result Value Ref Range   Sodium 137 135 - 145 mmol/L   Potassium 3.2 (L) 3.5 - 5.1 mmol/L   Chloride 103 98 - 111 mmol/L   CO2 20 (L) 22 - 32 mmol/L   Glucose, Bld 211 (H) 70 - 99 mg/dL    Comment: Glucose reference range applies only to samples taken after fasting for at least 8 hours.   BUN 19 6 - 20 mg/dL   Creatinine, Ser 1.18 0.61 - 1.24 mg/dL   Calcium 10.0 8.9 - 10.3 mg/dL   Total Protein 7.0 6.5 - 8.1 g/dL   Albumin 3.9 3.5 - 5.0 g/dL   AST 91 (H) 15 - 41 U/L   ALT 141 (H) 0 - 44 U/L   Alkaline Phosphatase 35 (L) 38 - 126 U/L   Total Bilirubin 2.3 (H) 0.3 - 1.2 mg/dL   GFR, Estimated >60 >60 mL/min    Comment: (NOTE) Calculated using the CKD-EPI Creatinine Equation (2021)    Anion gap 14 5 - 15    Comment: Performed at [facility] [address] [place], [place] [address]  Magnesium     Status: None   Collection Time: 08/01/21  3:42 PM  Result Value Ref Range   Magnesium 2.2 1.7 - 2.4 mg/dL    Comment: Performed at [facility], [place] [address] [place], [place] [address]  I-stat chem 8, ED (not at [facility] or [facility])     Status: Abnormal    Collection Time: 08/01/21  3:46 PM  Result Value Ref Range   Sodium 141 135 - 145 mmol/L   Potassium 3.3 (L) 3.5 - 5.1 mmol/L   Chloride 106 98 - 111 mmol/L   BUN 21 (H) 6 - 20 mg/dL   Creatinine, Ser 1.00 0.61 - 1.24 mg/dL   Glucose, Bld 207 (H) 70 - 99 mg/dL    Comment: Glucose reference range applies only to samples taken after fasting for at least 8 hours.   Calcium, Ion 1.27 1.15 - 1.40 mmol/L   TCO2 21 (L) 22 - 32 mmol/L   Hemoglobin 14.3 13.0 - 17.0 g/dL   HCT 42.0 39.0 - 52.0 %  HIV Antibody (routine testing w rflx)     Status: None   Collection Time: 08/01/21  3:52 PM  Result Value Ref Range   HIV Screen 4th Generation wRfx Non Reactive Non Reactive    Comment: Performed at [facility] [address], [place], [place] [address]  Resp Panel by RT-PCR (Flu A&B, Covid) Nasopharyngeal Swab     Status: None   Collection Time: 08/01/21  5:04 PM   Specimen: Nasopharyngeal Swab; Nasopharyngeal(NP) swabs in vial transport medium  Result Value Ref Range   SARS Coronavirus 2 by RT PCR NEGATIVE NEGATIVE    Comment: (NOTE) SARS-CoV-2 target nucleic acids are NOT DETECTED.  The SARS-CoV-2 RNA is generally detectable in upper respiratory specimens during the acute phase of infection. The lowest concentration of SARS-CoV-2 viral copies this assay can detect is 138 copies/mL.  A negative result does not preclude SARS-Cov-2 infection and should not be used as the sole basis for treatment or other patient management decisions. A negative result may occur with  improper specimen collection/handling, submission of specimen other than nasopharyngeal swab, presence of viral mutation(s) within the areas targeted by this assay, and inadequate number of viral copies(<138 copies/mL). A negative result must be combined with clinical observations, patient history, and epidemiological information. The expected result is Negative.  Fact Sheet for Patients:   EntrepreneurPulse.com.au  Fact Sheet for Healthcare Providers:  IncredibleEmployment.be  This test is no t yet approved or cleared by the [place] FDA and  has been authorized for detection and/or diagnosis of SARS-CoV-2 by FDA under an Emergency Use Authorization (EUA). This EUA will remain  in effect (meaning this test can be used) for the duration of the COVID-19 declaration under Section 564(b)(1) of the Act, 21 U.S.C.section 360bbb-3(b)(1), unless the authorization is terminated  or revoked sooner.       Influenza A by PCR NEGATIVE NEGATIVE   Influenza B by PCR NEGATIVE NEGATIVE    Comment: (NOTE) The Xpert Xpress SARS-CoV-2/FLU/RSV plus assay is intended as an aid in the diagnosis of influenza from Nasopharyngeal swab specimens and should not be used as a sole basis for treatment. Nasal washings and aspirates are unacceptable for Xpert Xpress SARS-CoV-2/FLU/RSV testing.  Fact Sheet for Patients: EntrepreneurPulse.com.au  Fact Sheet for Healthcare Providers: IncredibleEmployment.be  This test is not yet approved or cleared by the [place] FDA and has been authorized for detection and/or diagnosis of SARS-CoV-2 by FDA under an Emergency Use Authorization (EUA). This EUA will remain in effect (meaning this test can be used) for the duration of the COVID-19 declaration under Section 564(b)(1) of the Act, 21 U.S.C. section 360bbb-3(b)(1), unless the authorization is terminated or revoked.  Performed at [facility], [place] [address] [place], [place] [address]   Glucose, capillary     Status: None   Collection Time: 08/01/21  5:53 PM  Result Value Ref Range   Glucose-Capillary 86 70 - 99 mg/dL    Comment: Glucose reference range applies only to samples taken after fasting for at least 8 hours.  Rapid urine drug screen (hospital performed)     Status: Abnormal   Collection Time:  08/01/21  6:21 PM  Result Value Ref Range   Opiates NONE DETECTED NONE DETECTED   Cocaine NONE DETECTED NONE DETECTED   Benzodiazepines POSITIVE (A) NONE DETECTED   Amphetamines POSITIVE (A) NONE DETECTED   Tetrahydrocannabinol NONE DETECTED NONE DETECTED   Barbiturates NONE DETECTED NONE DETECTED    Comment: (NOTE) DRUG SCREEN FOR MEDICAL PURPOSES ONLY.  IF CONFIRMATION IS NEEDED FOR ANY PURPOSE, NOTIFY LAB WITHIN 5 DAYS.  LOWEST DETECTABLE LIMITS FOR URINE DRUG SCREEN Drug Class                     Cutoff (ng/mL) Amphetamine and metabolites    1000 Barbiturate and metabolites    200 Benzodiazepine                 102 Tricyclics and metabolites     300 Opiates and metabolites        300 Cocaine and metabolites        300 THC                            50 Performed at [facility],  [address], [place], [place] [address]   MRSA Next Gen by PCR, Nasal     Status: Abnormal   Collection Time: 08/01/21  6:21 PM   Specimen: Nasal Mucosa; Nasal Swab  Result Value Ref Range   MRSA by PCR Next Gen DETECTED (A) NOT DETECTED    Comment: RESULT CALLED TO, READ BACK BY AND VERIFIED WITH: RN MEL DORMAN 08/01/21_0 :29 BY TW (NOTE) The GeneXpert MRSA Assay (FDA approved for NASAL specimens only), is one component of a comprehensive MRSA colonization surveillance program. It is not intended to diagnose MRSA infection nor to guide or monitor treatment for MRSA infections. Test performance is not FDA approved in patients less than 55 years old. Performed at [facility], [place] [address] [place], [place] [address]   Hemoglobin A1c     Status: None   Collection Time: 08/01/21  8:01 PM  Result Value Ref Range   Hgb A1c MFr Bld 5.1 4.8 - 5.6 %    Comment: (NOTE) Pre diabetes:          5.7%-6.4%  Diabetes:              >6.4%  Glycemic control for   <7.0% adults with diabetes    Mean Plasma Glucose 99.67 mg/dL    Comment: Performed at [facility] [address], [place], [place] [address]  Glucose, capillary     Status: None   Collection Time: 08/01/21  8:04 PM  Result Value Ref Range   Glucose-Capillary 74 70 - 99 mg/dL    Comment: Glucose reference range applies only to samples taken after fasting for at least 8 hours.  Protime-INR     Status: None   Collection Time: 08/01/21 10:47 PM  Result Value Ref Range   Prothrombin Time 14.5 11.4 - 15.2 seconds   INR 1.1 0.8 - 1.2    Comment: (NOTE) INR goal varies based on device and disease states. Performed at [facility], [place] [address], [place], [place] [address]   Glucose, capillary     Status: None   Collection Time: 08/01/21 11:45 PM  Result Value Ref Range   Glucose-Capillary 70 70 - 99 mg/dL    Comment: Glucose reference range applies only to samples taken after fasting for at least 8 hours.  Glucose, capillary     Status: Abnormal   Collection Time: 08/02/21  3:36 AM  Result Value Ref Range   Glucose-Capillary 45 (L) 70 - 99 mg/dL    Comment: Glucose reference range applies only to samples taken after fasting for at least 8 hours.  Glucose, capillary     Status: None   Collection Time: 08/02/21  4:15 AM  Result Value Ref Range   Glucose-Capillary 95 70 - 99 mg/dL    Comment: Glucose reference range applies only to samples taken after fasting for at least 8 hours.  Phosphorus     Status: None   Collection Time: 08/02/21  5:49 AM  Result Value Ref Range   Phosphorus 2.6 2.5 - 4.6 mg/dL    Comment: Performed at [facility], [place] [address] [place], [place] [address]  Basic metabolic panel     Status: Abnormal   Collection Time: 08/02/21  5:49 AM  Result Value Ref Range   Sodium 140 135 - 145 mmol/L   Potassium 3.2 (L) 3.5 - 5.1 mmol/L   Chloride 112 (H) 98 - 111 mmol/L   CO2 20 (L) 22 -  32 mmol/L   Glucose, Bld 94 70 - 99 mg/dL    Comment: Glucose reference range applies only to samples taken after fasting for at least 8 hours.   BUN 19 6 - 20 mg/dL   Creatinine,  Ser 1.09 0.61 - 1.24 mg/dL   Calcium 9.2 8.9 - 10.3 mg/dL   GFR, Estimated >60 >60 mL/min    Comment: (NOTE) Calculated using the CKD-EPI Creatinine Equation (2021)    Anion gap 8 5 - 15    Comment: Performed at [facility] [address] [place], [place] [address]  CBC     Status: Abnormal   Collection Time: 08/02/21  5:49 AM  Result Value Ref Range   WBC 9.2 4.0 - 10.5 K/uL   RBC 4.18 (L) 4.22 - 5.81 MIL/uL   Hemoglobin 12.4 (L) 13.0 - 17.0 g/dL   HCT 37.0 (L) 39.0 - 52.0 %   MCV 88.5 80.0 - 100.0 fL   MCH 29.7 26.0 - 34.0 pg   MCHC 33.5 30.0 - 36.0 g/dL   RDW 13.2 11.5 - 15.5 %   Platelets 182 150 - 400 K/uL   nRBC 0.0 0.0 - 0.2 %    Comment: Performed at [facility], [place] [address], [place], [place] [address]  Hepatic function panel     Status: Abnormal   Collection Time: 08/02/21  5:49 AM  Result Value Ref Range   Total Protein 5.8 (L) 6.5 - 8.1 g/dL   Albumin 3.2 (L) 3.5 - 5.0 g/dL   AST 89 (H) 15 - 41 U/L   ALT 121 (H) 0 - 44 U/L   Alkaline Phosphatase 26 (L) 38 - 126 U/L   Total Bilirubin 2.6 (H) 0.3 - 1.2 mg/dL   Bilirubin, Direct 0.2 0.0 - 0.2 mg/dL   Indirect Bilirubin 2.4 (H) 0.3 - 0.9 mg/dL    Comment: Performed at [facility] [address], [place], [place] [address]  Triglycerides     Status: None   Collection Time: 08/02/21  5:49 AM  Result Value Ref Range   Triglycerides 83 <150 mg/dL    Comment: Performed at [facility] [address], [place], [place] [address]  Glucose, capillary     Status: Abnormal   Collection Time: 08/02/21  7:30 AM  Result Value Ref Range   Glucose-Capillary 42 (LL) 70 - 99 mg/dL    Comment: Glucose reference range applies only to samples taken after fasting for at least 8 hours.   Comment 1 Notify RN   Glucose, capillary     Status: Abnormal   Collection Time: 08/02/21  8:23 AM  Result Value Ref Range   Glucose-Capillary 49 (L) 70 - 99 mg/dL    Comment: Glucose reference range applies only to  samples taken after fasting for at least 8 hours.  Glucose, capillary     Status: Abnormal   Collection Time: 08/02/21  9:08 AM  Result Value Ref Range   Glucose-Capillary 62 (L) 70 - 99 mg/dL    Comment: Glucose reference range applies only to samples taken after fasting for at least 8 hours.  Glucose, capillary     Status: Abnormal   Collection Time: 08/02/21 10:11 AM  Result Value Ref Range   Glucose-Capillary 51 (L) 70 - 99 mg/dL    Comment: Glucose reference range applies only to samples taken after fasting for at least 8 hours.  Glucose, capillary     Status: Abnormal  Collection Time: 08/02/21 10:16 AM  Result Value Ref Range   Glucose-Capillary 66 (L) 70 - 99 mg/dL    Comment: Glucose reference range applies only to samples taken after fasting for at least 8 hours.  Glucose, capillary     Status: None   Collection Time: 08/02/21 11:15 AM  Result Value Ref Range   Glucose-Capillary 76 70 - 99 mg/dL    Comment: Glucose reference range applies only to samples taken after fasting for at least 8 hours.  Glucose, capillary     Status: None   Collection Time: 08/02/21 12:34 PM  Result Value Ref Range   Glucose-Capillary 76 70 - 99 mg/dL    Comment: Glucose reference range applies only to samples taken after fasting for at least 8 hours.  Magnesium     Status: None   Collection Time: 08/02/21  1:08 PM  Result Value Ref Range   Magnesium 2.2 1.7 - 2.4 mg/dL    Comment: Performed at [facility], [place] [address] [place], [place] [address]  Hepatitis panel, acute     Status: Abnormal   Collection Time: 08/02/21  1:08 PM  Result Value Ref Range   Hepatitis B Surface Ag NON REACTIVE NON REACTIVE   HCV Ab Reactive (A) NON REACTIVE    Comment: (NOTE) The CDC recommends that a Reactive HCV antibody result be followed up  with a HCV Nucleic Acid Amplification test.     Hep A IgM NON REACTIVE NON REACTIVE   Hep B C IgM NON REACTIVE NON REACTIVE    Comment: Performed at [facility], [place] [address], [place], [place] [address]  Glucose, capillary     Status: None   Collection Time: 08/02/21  3:10 PM  Result Value Ref Range   Glucose-Capillary 78 70 - 99 mg/dL    Comment: Glucose reference range applies only to samples taken after fasting for at least 8 hours.  Glucose, capillary     Status: None   Collection Time: 08/02/21  7:41 PM  Result Value Ref Range   Glucose-Capillary 94 70 - 99 mg/dL    Comment: Glucose reference range applies only to samples taken after fasting for at least 8 hours.  Glucose, capillary     Status: None   Collection Time: 08/02/21 10:25 PM  Result Value Ref Range   Glucose-Capillary 84 70 - 99 mg/dL    Comment: Glucose reference range applies only to samples taken after fasting for at least 8 hours.  Glucose, capillary     Status: Abnormal   Collection Time: 08/03/21 12:24 AM  Result Value Ref Range   Glucose-Capillary 101 (H) 70 - 99 mg/dL    Comment: Glucose reference range applies only to samples taken after fasting for at least 8 hours.  Glucose, capillary     Status: None   Collection Time: 08/03/21  3:08 AM  Result Value Ref Range   Glucose-Capillary 93 70 - 99 mg/dL    Comment: Glucose reference range applies only to samples taken after fasting for at least 8 hours.  Comprehensive metabolic panel     Status: Abnormal   Collection Time: 08/03/21  3:09 AM  Result Value Ref Range   Sodium 138 135 - 145 mmol/L   Potassium 3.7 3.5 - 5.1 mmol/L   Chloride 111 98 - 111 mmol/L   CO2 21 (L) 22 - 32 mmol/L   Glucose, Bld 89 70 - 99 mg/dL    Comment: Glucose reference  range applies only to samples taken after fasting for at least 8 hours.   BUN 10 6 - 20 mg/dL   Creatinine, Ser 0.78 0.61 - 1.24 mg/dL   Calcium 8.4 (L) 8.9 - 10.3 mg/dL   Total Protein 5.6 (L) 6.5 - 8.1 g/dL   Albumin 3.0 (L) 3.5 - 5.0 g/dL   AST 71 (H) 15 - 41 U/L   ALT 99 (H) 0 - 44 U/L   Alkaline Phosphatase 23 (L) 38 - 126 U/L   Total Bilirubin 2.7  (H) 0.3 - 1.2 mg/dL   GFR, Estimated >60 >60 mL/min    Comment: (NOTE) Calculated using the CKD-EPI Creatinine Equation (2021)    Anion gap 6 5 - 15    Comment: Performed at [facility] [address], [place], [place] [address]  Glucose, capillary     Status: Abnormal   Collection Time: 08/03/21  8:19 AM  Result Value Ref Range   Glucose-Capillary 122 (H) 70 - 99 mg/dL    Comment: Glucose reference range applies only to samples taken after fasting for at least 8 hours.    Current Facility-Administered Medications  Medication Dose Route Frequency Provider Last Rate Last Admin   acetaminophen (TYLENOL) tablet 650 mg  650 mg Oral Q6H PRN Mauri Brooklyn, MD   650 mg at 08/03/21 0427   chlorhexidine gluconate (MEDLINE KIT) (PERIDEX) 0.12 % solution 15 mL  15 mL Mouth Rinse BID Jennelle Human B, NP   15 mL at 08/02/21 1948   Chlorhexidine Gluconate Cloth 2 % PADS 6 each  6 each Topical Q0600 Jacky Kindle, MD   6 each at 08/02/21 0430   dexmedetomidine (PRECEDEX) 400 MCG/100ML (4 mcg/mL) infusion  0.4-1.2 mcg/kg/hr Intravenous Titrated Jennelle Human B, NP   Stopped at 08/03/21 0137   dextrose 5 %-0.9 % sodium chloride infusion   Intravenous Continuous Mauri Brooklyn, MD 100 mL/hr at 08/03/21 1000 Infusion Verify at 08/03/21 1000   docusate (COLACE) 50 MG/5ML liquid 100 mg  100 mg Per Tube BID PRN Jennelle Human B, NP       folic acid (FOLVITE) tablet 1 mg  1 mg Per Tube Daily Jennelle Human B, NP   1 mg at 08/03/21 1103   heparin injection 5,000 Units  5,000 Units Subcutaneous Q8H Jacky Kindle, MD   5,000 Units at 08/03/21 0646   MEDLINE mouth rinse  15 mL Mouth Rinse 10 times per day Jennelle Human B, NP   15 mL at 08/03/21 0650   mupirocin ointment (BACTROBAN) 2 % 1 application  1 application Nasal BID Jacky Kindle, MD   1 application at 33/82/50 1105   polyethylene glycol (MIRALAX / GLYCOLAX) packet 17 g  17 g Per Tube Daily PRN Jennelle Human B, NP       thiamine (B-1) injection 100  mg  100 mg Intravenous Daily Jennelle Human B, NP   100 mg at 08/03/21 1103    Musculoskeletal: Strength & Muscle Tone:  did not assess Gait & Station:  did not assess Patient leans:  not assess            Psychiatric Specialty Exam:  Presentation  General Appearance: Appropriate for Environment (c collar in place)  Eye Contact:Fleeting  Speech:Clear and Coherent; Normal Rate  Speech Volume:Decreased  Handedness: No data recorded  Mood and Affect  Mood:Dysphoric  Affect:Appropriate; Congruent   Thought Process  Thought Processes:Coherent; Goal Directed; Linear  Descriptions of Associations:Intact  Orientation:Full (Time, Place and Person)  Thought Content:WDL  History of Schizophrenia/Schizoaffective disorder:No data recorded Duration of Psychotic Symptoms:No data recorded Hallucinations:Hallucinations: None  Ideas of Reference:None  Suicidal Thoughts:Suicidal Thoughts: No  Homicidal Thoughts:Homicidal Thoughts: No   Sensorium  Memory: No data recorded Judgment:Impaired  Insight:Lacking   Executive Functions  Concentration:Fair  Attention Span:Fair  Recall:Fair; Poor  Fund of Knowledge:Fair  Language:Fair   Psychomotor Activity  Psychomotor Activity:Psychomotor Activity: Normal   Assets  Assets:Desire for Improvement   Sleep  Sleep:Sleep: Fair   Physical Exam: Physical Exam Constitutional:      Appearance: Normal appearance.     Comments: C collar in place  Pulmonary:     Effort: Pulmonary effort is normal.  Neurological:     Mental Status: He is alert.   Review of Systems  Psychiatric/Behavioral:  Positive for substance abuse. Negative for depression, hallucinations and suicidal ideas.   Blood pressure 123/73, pulse 62, temperature 98.9 F (37.2 C), temperature source Oral, resp. rate 14, height _0  (1.702 m), weight 68 kg, SpO2 99 %. Body mass index is 23.48 kg/m.  Treatment Plan Summary: 30 yo male wit  history of depression and substance use who presents from jail after he was found hanging in his cell for ~15 minutes. Patient denies these allegations; however, this is directly contradicted by jail personnel. Recommend inpatient admission at this time  -psychiatry to re-assess tomorrow--if it is possible to make safe disposition where he can return to jail this could be an option. However will recommend inpatient admission at this time   -psychiatry will reassess tomorrow -discussed recommendations with Dr. Vaughan Browner in person  Disposition: Recommend psychiatric Inpatient admission when medically cleared.  Ival Bible, MD 08/03/2021 11:09 AM

## 2021-08-03 NOTE — TOC Initial Note (Signed)
Transition of Care [facility]) - Initial/Assessment Note    Patient Details  Name: Danny Turner MRN: [id number] Date of Birth: 19-Mar-1991  [facility]) CM/SW Contact:    Carley Hammed, LCSWA Phone Number: 08/03/2021, 10:30 AM  Clinical Narrative:                 CSW was notified that while pt is not yet medically ready to DC, he is recommended to have inpatient psych placement at discharge. Pt is currently in custody and permission will need to be granted before pt can be admitted. Unable to contact necessary person at this time, will need to follow up during regular business hours. TOC will continue to follow.        Patient Goals and CMS Choice        Expected Discharge Plan and Services                                                Prior Living Arrangements/Services                       Activities of Daily Living      Permission Sought/Granted                  Emotional Assessment              Admission diagnosis:  Suicide attempt (HCC) [T14.91XA] Hanging, undetermined whether accidentally or purposely inflicted, initial encounter [T71.164A] Hanging, undetermined whether accidentally or purposely inflicted [T71.164A] Patient Active Problem List   Diagnosis Date Noted   Hanging, undetermined whether accidentally or purposely inflicted 08/01/2021   Acute encephalopathy    Acute hypoxemic respiratory failure (HCC)    Hypokalemia    Transaminitis    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 [place] [address] Phone: [phone] Fax: [phone]     Social Determinants of Health (SDOH) Interventions    Readmission Risk Interventions No flowsheet data found.

## 2021-08-03 NOTE — Progress Notes (Addendum)
NAME:  Danny Turner, MRN:  [id number], DOB:  [birth year], LOS: 2 ADMISSION DATE:  08/01/2021, CONSULTATION DATE:  08/01/21 REFERRING MD:  Tegeler CHIEF COMPLAINT:  Hanging   History of Present Illness:  Danny Turner is a 30 y.o. male who has a PMH as below and who is incarcerated at [facility].  He was brought to [facility] 9/9 after he was found hanging in his cell.  Felt to be hanging roughly 15 minutes; though EMS had to administer 2mg  IM Versed due to agitation and pt thrashing.  In ED, he was intubated for airway protection . He had CT angio head / neck that was negative for any fx or acute process. PCCM asked to see for admission.   Pertinent  Medical History:  has Psychoactive substance-induced psychosis (HCC); Amphetamine abuse (HCC); Hanging, undetermined whether accidentally or purposely inflicted; Acute encephalopathy; Acute hypoxemic respiratory failure (HCC); Hypokalemia; and Transaminitis on their problem list.  Significant Hospital Events: Including procedures, antibiotic start and stop dates in addition to other pertinent events   9/9 > Admit 9/10 Mental status improved extubated  Interim History / Subjective:   His mental status improved yesterday afternoon and he passed weaning trials Successfully extubated.  Remains in c-collar as he has cervical tenderness  Objective:  Blood pressure (!) 107/57, pulse 68, temperature (P) 98.9 F (37.2 C), temperature source (P) Oral, resp. rate 18, height 5\' 7"  (1.702 m), weight 68 kg, SpO2 99 %.    Vent Mode: PRVC FiO2 (%):  [40 %] 40 % Set Rate:  [20 bmp] 20 bmp Vt Set:  [520 mL] 520 mL PEEP:  [5 cmH20] 5 cmH20 Plateau Pressure:  [16 cmH20] 16 cmH20   Intake/Output Summary (Last 24 hours) at 08/03/2021 0839 Last data filed at 08/03/2021 0700 Gross per 24 hour  Intake 3571.84 ml  Output 2110 ml  Net 1461.84 ml   Filed Weights   08/01/21 1437 08/02/21 0422 08/03/21 0500  Weight: 79.4 kg 67.9 kg 68 kg   Examination: Gen:       No acute distress HEENT:  EOMI, sclera anicteric Neck:     No masses; no thyromegaly, cervical collar in place Lungs:    Clear to auscultation bilaterally; normal respiratory effort CV:         Regular rate and rhythm; no murmurs Abd:      + bowel sounds; soft, non-tender; no palpable masses, no distension Ext:    No edema; adequate peripheral perfusion Skin:      Warm and dry; no rash Neuro: alert and oriented x 3  Labs/imaging personally reviewed:  Labs are stable, LFTs improving Chest x-ray with no new abnormalities  Resolved Hospital Problem list:   Assessment & Plan:   Hanging - supposedly 15 minutes in his jail cell; though, on EMS arrival, he was noted to be combative and thrashing requiring 2mg  IM Versed.  CTA head and neck negative for acute injury  Encephalopathy, possibly multifactorial, metabolic/ toxic and concerning for anoxic injury 2/2 above. 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 - previous RUQ 10/02/21 05/29/21 normal - LFTs better today but still remain elevated, had normal INR 9/9 -  HIV neg -HCV antibody remains elevated.  HCV PCR ordered.  Hypoglycemia Improving.  We will wean off dextrose drip Optimize p.o. intake  Best practice (evaluated daily):  Diet/type: Regular consistency (see orders);  DVT prophylaxis: prophylactic heparin SQ GI prophylaxis: PPI Lines: N/A Foley:  Yes, and it is no longer needed and removal ordered   Code Status:  full code Last date of multidisciplinary goals of care discussion: remains in police custody at this time.   Critical care time: NA   Chilton Greathouse MD [place]  Pulmonary & Critical care See Amion for pager  If no response to pager , please call [phone] until 7pm After 7:00 pm call Elink  [phone] 08/03/2021, 8:39 AM

## 2021-08-04 DIAGNOSIS — E43 Unspecified severe protein-calorie malnutrition: Secondary | ICD-10-CM | POA: Insufficient documentation

## 2021-08-04 DIAGNOSIS — T1491XA Suicide attempt, initial encounter: Secondary | ICD-10-CM

## 2021-08-04 LAB — HEPATIC FUNCTION PANEL
ALT: 107 U/L — ABNORMAL HIGH (ref 0–44)
AST: 113 U/L — ABNORMAL HIGH (ref 15–41)
Albumin: 3.1 g/dL — ABNORMAL LOW (ref 3.5–5.0)
Alkaline Phosphatase: 26 U/L — ABNORMAL LOW (ref 38–126)
Bilirubin, Direct: 0.2 mg/dL (ref 0.0–0.2)
Indirect Bilirubin: 1.6 mg/dL — ABNORMAL HIGH (ref 0.3–0.9)
Total Bilirubin: 1.8 mg/dL — ABNORMAL HIGH (ref 0.3–1.2)
Total Protein: 6 g/dL — ABNORMAL LOW (ref 6.5–8.1)

## 2021-08-04 LAB — CBC
HCT: 37.8 % — ABNORMAL LOW (ref 39.0–52.0)
Hemoglobin: 12.5 g/dL — ABNORMAL LOW (ref 13.0–17.0)
MCH: 29.4 pg (ref 26.0–34.0)
MCHC: 33.1 g/dL (ref 30.0–36.0)
MCV: 88.9 fL (ref 80.0–100.0)
Platelets: 161 10*3/uL (ref 150–400)
RBC: 4.25 MIL/uL (ref 4.22–5.81)
RDW: 13 % (ref 11.5–15.5)
WBC: 4.5 10*3/uL (ref 4.0–10.5)
nRBC: 0 % (ref 0.0–0.2)

## 2021-08-04 LAB — BASIC METABOLIC PANEL
Anion gap: 6 (ref 5–15)
BUN: 5 mg/dL — ABNORMAL LOW (ref 6–20)
CO2: 26 mmol/L (ref 22–32)
Calcium: 8.9 mg/dL (ref 8.9–10.3)
Chloride: 107 mmol/L (ref 98–111)
Creatinine, Ser: 0.9 mg/dL (ref 0.61–1.24)
GFR, Estimated: >60 mL/min (ref >60)
Glucose, Bld: 122 mg/dL — ABNORMAL HIGH (ref 70–99)
Potassium: 4.2 mmol/L (ref 3.5–5.1)
Sodium: 139 mmol/L (ref 135–145)

## 2021-08-04 LAB — GLUCOSE, CAPILLARY
Glucose-Capillary: 93 mg/dL (ref 70–99)
Glucose-Capillary: 55 mg/dL — ABNORMAL LOW (ref 70–99)
Glucose-Capillary: 108 mg/dL — ABNORMAL HIGH (ref 70–99)
Glucose-Capillary: 107 mg/dL — ABNORMAL HIGH (ref 70–99)
Glucose-Capillary: 130 mg/dL — ABNORMAL HIGH (ref 70–99)

## 2021-08-04 MED ORDER — ENSURE ENLIVE PO LIQD
237.0000 mL | Freq: Two times a day (BID) | ORAL | Status: DC
  Administered 2021-08-04 – 2021-08-06 (×4): 237 mL via ORAL

## 2021-08-04 MED ORDER — LORAZEPAM 2 MG/ML IJ SOLN
1.0000 mg | INTRAMUSCULAR | Status: DC | PRN
  Administered 2021-08-05 – 2021-08-06 (×4): 2 mg via INTRAVENOUS
  Filled 2021-08-04 (×4): qty 1

## 2021-08-04 MED ORDER — LORAZEPAM 2 MG/ML IJ SOLN
INTRAMUSCULAR | Status: AC
  Filled 2021-08-04: qty 1

## 2021-08-04 NOTE — Consult Note (Signed)
Reason for Consult: Suicide Attempt via Hanging Referring Physician: Chilton Greathouse MD  Danny Turner is an 30 y.o. male.  HPI:  Danny Turner is a 26 yr odl male who presents with polysubstance abuse who presented from [facility] jail after he was found hanging in his cell for approx 15 minutes.  He reports that he is doing well today.  He reports that he slept decently last night.  He reports that his appetite has been good.  He reports no SI, HI, or AVH.  When asked he reports that he still does not remember anything surrounding time he was found hanging in his jail cell.  He states that in the past he has been on Zoloft and it worked well for him.  He reports being started on that at age 55 and taking it until 54.  He reports that he stopped taking it because that point he was released from prison and so no longer was given it.  During the interview patient was pleasant and cooperative until the end.  At this point I asked the officer present in the room if there would be ability for the jail to place him on suicide watch.  The officer started to respond with yes however the patient became agitated and interjected that they would not do anything for him.  He stated that he had been telling them for about 25 days that he needed medication/help and even had it in writing but that they repeatedly told him he would have to wait until he got to jail to receive help.  He reports no other concerns at present.  History reviewed. No pertinent past medical history.  Past Surgical History:  Procedure Laterality Date   APPENDECTOMY     LEG SURGERY Left     No family history on file.  Social History:  reports that he has been smoking cigarettes. He has been smoking an average of 1 pack per day. He has never used smokeless tobacco. He reports that he does not currently use alcohol. He reports current drug use. Drug: Methamphetamines.  Allergies:  Allergies  Allergen Reactions   Penicillins      Medications: I have reviewed the patient's current medications.  Results for orders placed or performed during the hospital encounter of 08/01/21 (from the past 48 hour(s))  Glucose, capillary     Status: Abnormal   Collection Time: 08/02/21  7:30 AM  Result Value Ref Range   Glucose-Capillary 42 (LL) 70 - 99 mg/dL    Comment: Glucose reference range applies only to samples taken after fasting for at least 8 hours.   Comment 1 Notify RN   Glucose, capillary     Status: Abnormal   Collection Time: 08/02/21  8:23 AM  Result Value Ref Range   Glucose-Capillary 49 (L) 70 - 99 mg/dL    Comment: Glucose reference range applies only to samples taken after fasting for at least 8 hours.  Glucose, capillary     Status: Abnormal   Collection Time: 08/02/21  9:08 AM  Result Value Ref Range   Glucose-Capillary 62 (L) 70 - 99 mg/dL    Comment: Glucose reference range applies only to samples taken after fasting for at least 8 hours.  Glucose, capillary     Status: Abnormal   Collection Time: 08/02/21 10:11 AM  Result Value Ref Range   Glucose-Capillary 51 (L) 70 - 99 mg/dL    Comment: Glucose reference range applies only to samples taken after fasting for  at least 8 hours.  Glucose, capillary     Status: Abnormal   Collection Time: 08/02/21 10:16 AM  Result Value Ref Range   Glucose-Capillary 66 (L) 70 - 99 mg/dL    Comment: Glucose reference range applies only to samples taken after fasting for at least 8 hours.  Glucose, capillary     Status: None   Collection Time: 08/02/21 11:15 AM  Result Value Ref Range   Glucose-Capillary 76 70 - 99 mg/dL    Comment: Glucose reference range applies only to samples taken after fasting for at least 8 hours.  Glucose, capillary     Status: None   Collection Time: 08/02/21 12:34 PM  Result Value Ref Range   Glucose-Capillary 76 70 - 99 mg/dL    Comment: Glucose reference range applies only to samples taken after fasting for at least 8 hours.   Magnesium     Status: None   Collection Time: 08/02/21  1:08 PM  Result Value Ref Range   Magnesium 2.2 1.7 - 2.4 mg/dL    Comment: Performed at [facility], [address], [place], [place] [address]  Hepatitis panel, acute     Status: Abnormal   Collection Time: 08/02/21  1:08 PM  Result Value Ref Range   Hepatitis B Surface Ag NON REACTIVE NON REACTIVE   HCV Ab Reactive (A) NON REACTIVE    Comment: (NOTE) The CDC recommends that a Reactive HCV antibody result be followed up  with a HCV Nucleic Acid Amplification test.     Hep A IgM NON REACTIVE NON REACTIVE   Hep B C IgM NON REACTIVE NON REACTIVE    Comment: Performed at [facility], [address], [place], [place] [address]  Glucose, capillary     Status: None   Collection Time: 08/02/21  3:10 PM  Result Value Ref Range   Glucose-Capillary 78 70 - 99 mg/dL    Comment: Glucose reference range applies only to samples taken after fasting for at least 8 hours.  Glucose, capillary     Status: None   Collection Time: 08/02/21  7:41 PM  Result Value Ref Range   Glucose-Capillary 94 70 - 99 mg/dL    Comment: Glucose reference range applies only to samples taken after fasting for at least 8 hours.  Glucose, capillary     Status: None   Collection Time: 08/02/21 10:25 PM  Result Value Ref Range   Glucose-Capillary 84 70 - 99 mg/dL    Comment: Glucose reference range applies only to samples taken after fasting for at least 8 hours.  Glucose, capillary     Status: Abnormal   Collection Time: 08/03/21 12:24 AM  Result Value Ref Range   Glucose-Capillary 101 (H) 70 - 99 mg/dL    Comment: Glucose reference range applies only to samples taken after fasting for at least 8 hours.  Glucose, capillary     Status: None   Collection Time: 08/03/21  3:08 AM  Result Value Ref Range   Glucose-Capillary 93 70 - 99 mg/dL    Comment: Glucose reference range applies only to samples taken after fasting for at least 8 hours.   Comprehensive metabolic panel     Status: Abnormal   Collection Time: 08/03/21  3:09 AM  Result Value Ref Range   Sodium 138 135 - 145 mmol/L   Potassium 3.7 3.5 - 5.1 mmol/L   Chloride 111 98 - 111 mmol/L   CO2 21 (L) 22 - 32  mmol/L   Glucose, Bld 89 70 - 99 mg/dL    Comment: Glucose reference range applies only to samples taken after fasting for at least 8 hours.   BUN 10 6 - 20 mg/dL   Creatinine, Ser 1.84 0.61 - 1.24 mg/dL   Calcium 8.4 (L) 8.9 - 10.3 mg/dL   Total Protein 5.6 (L) 6.5 - 8.1 g/dL   Albumin 3.0 (L) 3.5 - 5.0 g/dL   AST 71 (H) 15 - 41 U/L   ALT 99 (H) 0 - 44 U/L   Alkaline Phosphatase 23 (L) 38 - 126 U/L   Total Bilirubin 2.7 (H) 0.3 - 1.2 mg/dL   GFR, Estimated >03 >75 mL/min    Comment: (NOTE) Calculated using the CKD-EPI Creatinine Equation (2021)    Anion gap 6 5 - 15    Comment: Performed at [facility], [address] [place], [place] [address]  Glucose, capillary     Status: Abnormal   Collection Time: 08/03/21  8:19 AM  Result Value Ref Range   Glucose-Capillary 122 (H) 70 - 99 mg/dL    Comment: Glucose reference range applies only to samples taken after fasting for at least 8 hours.  Glucose, capillary     Status: None   Collection Time: 08/03/21 11:13 AM  Result Value Ref Range   Glucose-Capillary 76 70 - 99 mg/dL    Comment: Glucose reference range applies only to samples taken after fasting for at least 8 hours.  Glucose, capillary     Status: Abnormal   Collection Time: 08/03/21  5:02 PM  Result Value Ref Range   Glucose-Capillary 111 (H) 70 - 99 mg/dL    Comment: Glucose reference range applies only to samples taken after fasting for at least 8 hours.  Glucose, capillary     Status: Abnormal   Collection Time: 08/03/21  7:46 PM  Result Value Ref Range   Glucose-Capillary 100 (H) 70 - 99 mg/dL    Comment: Glucose reference range applies only to samples taken after fasting for at least 8 hours.  Glucose, capillary     Status: None    Collection Time: 08/03/21 11:57 PM  Result Value Ref Range   Glucose-Capillary 95 70 - 99 mg/dL    Comment: Glucose reference range applies only to samples taken after fasting for at least 8 hours.  Glucose, capillary     Status: None   Collection Time: 08/04/21  3:47 AM  Result Value Ref Range   Glucose-Capillary 93 70 - 99 mg/dL    Comment: Glucose reference range applies only to samples taken after fasting for at least 8 hours.    MR CERVICAL SPINE WO CONTRAST  Result Date: 08/03/2021 CLINICAL DATA:  Neck trauma.  Found hanging. EXAM: MRI CERVICAL SPINE WITHOUT CONTRAST TECHNIQUE: Multiplanar, multisequence MR imaging of the cervical spine was performed. No intravenous contrast was administered. COMPARISON:  CTA neck dated August 01, 2021. CT cervical spine dated June 29, 2021. FINDINGS: Alignment: Physiologic. Vertebrae: No fracture, evidence of discitis, or bone lesion. Cord: Normal signal and morphology. Posterior Fossa, vertebral arteries, paraspinal tissues: Diffuse interspinous edema. Otherwise negative. Disc levels: No significant disc bulge or herniation.  No stenosis. IMPRESSION: 1. No acute traumatic injury of the cervical spine. 2. Diffuse interspinous edema, likely reflecting ligamentous strain. Electronically Signed   By: Obie Dredge M.D.   On: 08/03/2021 13:14   DG CHEST PORT 1 VIEW  Result Date: 08/03/2021 CLINICAL DATA:  Respiratory failure.  Suicide attempt.  EXAM: PORTABLE CHEST 1 VIEW COMPARISON:  08/01/2021 FINDINGS: Numerous leads and wires project over the chest. Midline trachea. Normal heart size. No pleural effusion or pneumothorax. Extubation and removal of nasogastric tube. Diffuse peribronchial thickening. Suspect medial left lower lobe volume loss and retrocardiac airspace disease. IMPRESSION: Left lower lobe volume loss and airspace disease, most likely atelectasis. Cannot exclude early or mild infection. Peribronchial thickening which may relate to chronic  bronchitis or smoking. Electronically Signed   By: Jeronimo GreavesKyle  Talbot M.D.   On: 08/03/2021 08:44    Review of Systems  Respiratory:  Negative for cough, choking and shortness of breath.   Cardiovascular:  Negative for chest pain.  Gastrointestinal:  Negative for abdominal pain, constipation, diarrhea, nausea and vomiting.  Musculoskeletal:  Negative for neck pain and neck stiffness.  Neurological:  Negative for weakness and headaches.  Psychiatric/Behavioral:  Positive for agitation (when discussing retunr to jail). Negative for behavioral problems and suicidal ideas. The patient is nervous/anxious.   Blood pressure 125/77, pulse (!) 53, temperature 98.2 F (36.8 C), temperature source Oral, resp. rate 20, height 5\' 7"  (1.702 m), weight 69.4 kg, SpO2 97 %. Physical Exam Vitals and nursing note reviewed.  Constitutional:      General: He is not in acute distress.    Appearance: Normal appearance. He is normal weight. He is not ill-appearing or toxic-appearing.  Pulmonary:     Effort: Pulmonary effort is normal.  Musculoskeletal:        General: Normal range of motion.  Neurological:     General: No focal deficit present.     Mental Status: He is alert.    08/04/21 1346  Presentation  General Appearance Appropriate for Environment;Casual  Eye Contact Fair  Speech Clear and Coherent;Normal Rate  Speech Volume Normal  Mood and Affect  Mood Dysphoric  Affect Appropriate;Congruent  Thought Processes  Thought Process Coherent  Descriptions of Associations Intact  Orientation Full (Time, Place and Person)  Thought Content WDL  Hallucinations None  Ideas of Reference None  Suicidal Thoughts No  Homicidal Thoughts No  Sensorium  Memory Immediate Fair;Recent Fair  Judgment Poor  Insight Poor  Executive Functions  Concentration Fair  Attention Span Fair  Recall FiservFair  Fund of Knowledge Fair  Language Fair  Psychomotor Activity  Psychomotor Activity Normal  Assets  Assets  Resilience  Sleep  Sleep Fair    Assessment/Plan: Case and Plan discussed with Dr. Lucianne MussKumar  Danny Turner is a 89 yr odl male who presents with polysubstance abuse who presented from [facility] after he was found hanging in his cell for approx 15 minutes.  He was calm and cooperative until the discussion of a possible return to the jail.  This leads me to think that there might be some sort of secondary gain on his part.  However, if the jail is able to place him on suicide watch/prevention he would be cleared from a psychiatric standpoint to be discharged.  If the jail is not able to provide this that he would still need inpatient admission.  Would not recommend starting any psychiatric medications at this time.   Continue rest of care per Primary Team Psychiatry will continue to follow the patient   Disposition: Recommend psychiatric Inpatient admission when medically cleared if jail is unable to place patient under suicide watch/prevention.   Lauro Franklinlexander S Caelin Rosen 08/04/2021, 7:25 AM

## 2021-08-04 NOTE — Progress Notes (Addendum)
Patient refuse to take a bath today, pt educated and is aware of hospital policy.

## 2021-08-04 NOTE — TOC Initial Note (Addendum)
Transition of Care [facility]) - Initial/Assessment Note    Patient Details  Name: Danny Turner MRN: [id number] Date of Birth: 09-15-1991  [facility]) CM/SW Contact:    Kermit Balo, RN Phone Number: 08/04/2021, 12:24 PM  Clinical Narrative:                 Pt is from jail. CM was provided number for [place] [phone] from jail services. CM called Brett Canales and he states the patient will be under an unsecured bond at d/c and will need an ankle monitor. Brett Canales states the monitor will not work at [facility] so will need to be arranged prior to d/c. CM has sent pts information to [place] CSW at [facility].  ToC following.  1415: [facility] will not have a bed today as they have a wait list for admission and are still reviewing his information.  CM called:  [facility] and they have no beds with 15 on the waitlist. [facility] and they have no beds but did ask that I fax pts information [phone]). Information sent. [facility] and was unable to speak with anyone. [facility] only admits Geriatrics. [facility] has no beds and the ED is full on wait list. They did say I could fax the information [phone]). Information sent.  Expected Discharge Plan: Psychiatric Hospital Barriers to Discharge: Continued Medical Work up   Patient Goals and CMS Choice     Choice offered to / list presented to : Patient  Expected Discharge Plan and Services Expected Discharge Plan: Psychiatric Hospital In-house Referral: Clinical Social Work Discharge Planning Services: CM Consult   Living arrangements for the past 2 months: Holiday representative                                      Prior Living Arrangements/Services Living arrangements for the past 2 months: Holiday representative                     Activities of Daily Living      Permission Sought/Granted                  Emotional Assessment              Admission diagnosis:  Suicide attempt (HCC)  [T14.91XA] Hanging, undetermined whether accidentally or purposely inflicted, initial encounter [T71.164A] Hanging, undetermined whether accidentally or purposely inflicted [T71.164A] Patient Active Problem List   Diagnosis Date Noted   Hanging, undetermined whether accidentally or purposely inflicted 08/01/2021   Acute encephalopathy    Acute hypoxemic respiratory failure (HCC)    Hypokalemia    Transaminitis    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 [place] [address] Phone: [phone] Fax: [phone]     Social Determinants of Health (SDOH) Interventions    Readmission Risk Interventions No flowsheet data found.

## 2021-08-04 NOTE — Progress Notes (Addendum)
PROGRESS NOTE    Danny Turner  VEH:[id number] DOB: May 01, 1991 DOA: 08/01/2021 PCP: Pcp, No   Brief Narrative: 30 year old with past medical history significant for psychoactive substance induced psychosis, amphetamine abuse, presents with altered mental status from [facility], patient was brought to [facility] on 9/9 after he was found hanging in his cell.  Felt to be handing roughly for 15 minutes.,  Though EMS had to administer 2 mg of intramuscular Versed due to agitation. Was intubated in the ED for airway protection.  CT head angio and neck was negative for any fracture or acute process.  Patient was admitted by CCM.  Patient mental status improved and he was extubated 9/10.  Patient was evaluated by psych, who is recommending inpatient psychiatric admission.    Assessment & Plan:   Active Problems:   Hanging, undetermined whether accidentally or purposely inflicted  1-Hanging, undetermined whether accidentally or purposely inflicted:  ? Suicidal Attempt;  -He was intubated on admission for airway protection. Extubated 9/10. -Evaluated by psych, who recommend inpatient psych admission.  -He will be re-evaluate by psych today.  -MR cervical spine: no acute traumatic injury. Diffuse inter spinose edema, likely reflecting ligamentous strain. Cervical Collar removed.   2-Acute metabolic, toxic encephalopathy, versus anoxic secondary to above injury. -History of polysubstance abuse, UDS was positive for amphetamine even though he was in jail. Mental status improved.  3-Transaminases: Positive HCV antibody Test trending down. IV negative. HSV PCR ordered.  This will need to be follow-up.  Advised patient that he will need to see infectious disease outpatient  4-Hypoglycemia: Poor oral intake. Advance diet.      Nutrition Problem: Inadequate oral intake Etiology: inability to eat    Signs/Symptoms: NPO status    Interventions: Tube feeding  Estimated body mass  index is 23.96 kg/m as calculated from the following:   Height as of this encounter: 5' 7" (1.702 m).   Weight as of this encounter: 69.4 kg.   DVT prophylaxis: Heparin  Code Status: Full Code Family Communication: care discussed with patient  Disposition Plan:  Status is: Inpatient  Remains inpatient appropriate because:Inpatient level of care appropriate due to severity of illness  Dispo: The patient is from:  [place];               Anticipated d/c is to:  Needs inpatient psych facility admission               Patient currently is medically stable to d/c. Needs PT eval   Difficult to place patient No        Consultants:  CCM Psych   Procedures:  Intubation   Antimicrobials:    Subjective: He is alert, oriented times 3, he doesn't recalled events in [place].  He denies pain. He doesn't like clear diet. Would like regular diet.   Objective: Vitals:   08/04/21 0300 08/04/21 0400 08/04/21 0500 08/04/21 0600  BP: (!) 111/97 113/84 98/88 125/77  Pulse: 60 61 65 (!) 53  Resp: _0 Temp: 98.2 F (36.8 C)     TempSrc: Oral     SpO2: 98% 96% 98% 97%  Weight:   69.4 kg   Height:        Intake/Output Summary (Last 24 hours) at 08/04/2021 0835 Last data filed at 08/04/2021 0600 Gross per 24 hour  Intake 1921.91 ml  Output 2100 ml  Net -178.09 ml   Filed Weights   08/02/21 0422 08/03/21 0500 08/04/21 0500  Weight:  67.9 kg 68 kg 69.4 kg    Examination:  General exam: Appears calm and comfortable  Respiratory system: Clear to auscultation. Respiratory effort normal. Cardiovascular system: S1 & S2 heard, RRR. No JVD, murmurs, rubs, gallops or clicks. No pedal edema. Gastrointestinal system: Abdomen is nondistended, soft and nontender. No organomegaly or masses felt. Normal bowel sounds heard. Central nervous system: Alert and oriented.  Extremities: Symmetric 5 x 5 power.   Data Reviewed: I have personally reviewed following labs and imaging  studies  CBC: Recent Labs  Lab 08/01/21 1535 08/01/21 1542 08/01/21 1546 08/02/21 0549  WBC  --  17.0*  --  9.2  NEUTROABS  --  14.3*  --   --   HGB 13.9 14.5 14.3 12.4*  HCT 41.0 43.7 42.0 37.0*  MCV  --  90.3  --  88.5  PLT  --  219  --  295   Basic Metabolic Panel: Recent Labs  Lab 08/01/21 1535 08/01/21 1542 08/01/21 1546 08/02/21 0549 08/02/21 1308 08/03/21 0309  NA 141 137 141 140  --  138  K 3.2* 3.2* 3.3* 3.2*  --  3.7  CL  --  103 106 112*  --  111  CO2  --  20*  --  20*  --  21*  GLUCOSE  --  211* 207* 94  --  89  BUN  --  19 21* 19  --  10  CREATININE  --  1.18 1.00 1.09  --  0.78  CALCIUM  --  10.0  --  9.2  --  8.4*  MG  --  2.2  --   --  2.2  --   PHOS  --   --   --  2.6  --   --    GFR: Estimated Creatinine Clearance: 125.1 mL/min (by C-G formula based on SCr of 0.78 mg/dL). Liver Function Tests: Recent Labs  Lab 08/01/21 1542 08/02/21 0549 08/03/21 0309  AST 91* 89* 71*  ALT 141* 121* 99*  ALKPHOS 35* 26* 23*  BILITOT 2.3* 2.6* 2.7*  PROT 7.0 5.8* 5.6*  ALBUMIN 3.9 3.2* 3.0*   No results for input(s): LIPASE, AMYLASE in the last 168 hours. No results for input(s): AMMONIA in the last 168 hours. Coagulation Profile: Recent Labs  Lab 08/01/21 2247  INR 1.1   Cardiac Enzymes: No results for input(s): CKTOTAL, CKMB, CKMBINDEX, TROPONINI in the last 168 hours. BNP (last 3 results) No results for input(s): PROBNP in the last 8760 hours. HbA1C: Recent Labs    08/01/21 2001  HGBA1C 5.1   CBG: Recent Labs  Lab 08/03/21 1702 08/03/21 1946 08/03/21 2357 08/04/21 0347 08/04/21 0759  GLUCAP 111* 100* 95 93 55*   Lipid Profile: Recent Labs    08/02/21 0549  TRIG 83   Thyroid Function Tests: No results for input(s): TSH, T4TOTAL, FREET4, T3FREE, THYROIDAB in the last 72 hours. Anemia Panel: No results for input(s): VITAMINB12, FOLATE, FERRITIN, TIBC, IRON, RETICCTPCT in the last 72 hours. Sepsis Labs: No results for input(s):  PROCALCITON, LATICACIDVEN in the last 168 hours.  Recent Results (from the past 240 hour(s))  Resp Panel by RT-PCR (Flu A&B, Covid) Nasopharyngeal Swab     Status: None   Collection Time: 08/01/21  5:04 PM   Specimen: Nasopharyngeal Swab; Nasopharyngeal(NP) swabs in vial transport medium  Result Value Ref Range Status   SARS Coronavirus 2 by RT PCR NEGATIVE NEGATIVE Final    Comment: (NOTE) SARS-CoV-2 target nucleic acids are  NOT DETECTED.  The SARS-CoV-2 RNA is generally detectable in upper respiratory specimens during the acute phase of infection. The lowest concentration of SARS-CoV-2 viral copies this assay can detect is 138 copies/mL. A negative result does not preclude SARS-Cov-2 infection and should not be used as the sole basis for treatment or other patient management decisions. A negative result may occur with  improper specimen collection/handling, submission of specimen other than nasopharyngeal swab, presence of viral mutation(s) within the areas targeted by this assay, and inadequate number of viral copies(<138 copies/mL). A negative result must be combined with clinical observations, patient history, and epidemiological information. The expected result is Negative.  Fact Sheet for Patients:  EntrepreneurPulse.com.au  Fact Sheet for Healthcare Providers:  IncredibleEmployment.be  This test is no t yet approved or cleared by the [place] FDA and  has been authorized for detection and/or diagnosis of SARS-CoV-2 by FDA under an Emergency Use Authorization (EUA). This EUA will remain  in effect (meaning this test can be used) for the duration of the COVID-19 declaration under Section 564(b)(1) of the Act, 21 U.S.C.section 360bbb-3(b)(1), unless the authorization is terminated  or revoked sooner.       Influenza A by PCR NEGATIVE NEGATIVE Final   Influenza B by PCR NEGATIVE NEGATIVE Final    Comment: (NOTE) The Xpert Xpress  SARS-CoV-2/FLU/RSV plus assay is intended as an aid in the diagnosis of influenza from Nasopharyngeal swab specimens and should not be used as a sole basis for treatment. Nasal washings and aspirates are unacceptable for Xpert Xpress SARS-CoV-2/FLU/RSV testing.  Fact Sheet for Patients: EntrepreneurPulse.com.au  Fact Sheet for Healthcare Providers: IncredibleEmployment.be  This test is not yet approved or cleared by the [place] FDA and has been authorized for detection and/or diagnosis of SARS-CoV-2 by FDA under an Emergency Use Authorization (EUA). This EUA will remain in effect (meaning this test can be used) for the duration of the COVID-19 declaration under Section 564(b)(1) of the Act, 21 U.S.C. section 360bbb-3(b)(1), unless the authorization is terminated or revoked.  Performed at [facility], [place] [address] [place], [place] [address]   MRSA Next Gen by PCR, Nasal     Status: Abnormal   Collection Time: 08/01/21  6:21 PM   Specimen: Nasal Mucosa; Nasal Swab  Result Value Ref Range Status   MRSA by PCR Next Gen DETECTED (A) NOT DETECTED Final    Comment: RESULT CALLED TO, READ BACK BY AND VERIFIED WITH: RN MEL DORMAN 08/01/21_0 :29 BY TW (NOTE) The GeneXpert MRSA Assay (FDA approved for NASAL specimens only), is one component of a comprehensive MRSA colonization surveillance program. It is not intended to diagnose MRSA infection nor to guide or monitor treatment for MRSA infections. Test performance is not FDA approved in patients less than 79 years old. Performed at [facility], [place] [address], [place], [place] [address]          Radiology Studies: MR CERVICAL SPINE WO CONTRAST  Result Date: 08/03/2021 CLINICAL DATA:  Neck trauma.  Found hanging. EXAM: MRI CERVICAL SPINE WITHOUT CONTRAST TECHNIQUE: Multiplanar, multisequence MR imaging of the cervical spine was performed. No intravenous contrast was  administered. COMPARISON:  CTA neck dated August 01, 2021. CT cervical spine dated June 29, 2021. FINDINGS: Alignment: Physiologic. Vertebrae: No fracture, evidence of discitis, or bone lesion. Cord: Normal signal and morphology. Posterior Fossa, vertebral arteries, paraspinal tissues: Diffuse interspinous edema. Otherwise negative. Disc levels: No significant disc bulge or herniation.  No stenosis. IMPRESSION: 1.  No acute traumatic injury of the cervical spine. 2. Diffuse interspinous edema, likely reflecting ligamentous strain. Electronically Signed   By: Titus Dubin M.D.   On: 08/03/2021 13:14   DG CHEST PORT 1 VIEW  Result Date: 08/03/2021 CLINICAL DATA:  Respiratory failure.  Suicide attempt. EXAM: PORTABLE CHEST 1 VIEW COMPARISON:  08/01/2021 FINDINGS: Numerous leads and wires project over the chest. Midline trachea. Normal heart size. No pleural effusion or pneumothorax. Extubation and removal of nasogastric tube. Diffuse peribronchial thickening. Suspect medial left lower lobe volume loss and retrocardiac airspace disease. IMPRESSION: Left lower lobe volume loss and airspace disease, most likely atelectasis. Cannot exclude early or mild infection. Peribronchial thickening which may relate to chronic bronchitis or smoking. Electronically Signed   By: Abigail Miyamoto M.D.   On: 08/03/2021 08:44        Scheduled Meds:  chlorhexidine gluconate (MEDLINE KIT)  15 mL Mouth Rinse BID   Chlorhexidine Gluconate Cloth  6 each Topical T2549   folic acid  1 mg Per Tube Daily   heparin  5,000 Units Subcutaneous Q8H   mupirocin ointment  1 application Nasal BID   thiamine  100 mg Intravenous Daily   Continuous Infusions:  dextrose 5 % and 0.9% NaCl 100 mL/hr at 08/04/21 0821     LOS: 3 days    Time spent: 35 minutes    Biridiana Twardowski A Jaimes Eckert, MD [facility]   If 7PM-7AM, please contact night-coverage [email or web address]  08/04/2021, 8:35 AM

## 2021-08-04 NOTE — Progress Notes (Signed)
Nutrition Follow-up  DOCUMENTATION CODES:   Severe malnutrition in context of social or environmental circumstances  INTERVENTION:  -Ensure Enlive po BID, each supplement provides 350 kcal and 20 grams of protein  NUTRITION DIAGNOSIS:   Severe Malnutrition related to social / environmental circumstances as evidenced by severe fat depletion, severe muscle depletion, percent weight loss.  Progressing, pt on PO diet  GOAL:   Patient will meet greater than or equal to 90% of their needs  progressing  MONITOR:   PO intake, Supplement acceptance, Weight trends, Labs, I & O's  REASON FOR ASSESSMENT:   Ventilator, Consult Enteral/tube feeding initiation and management  ASSESSMENT:   30 yo male admitted from [facility] after being found hanging in his cell. PMH includes psychoactive substance-induced psychosis, amphetamine abuse, smoker.  9/09 intubated 9/10 extubated   Discussed pt with RN.   Pt resting at time of RD visit and provided very limited responses to RD questions. States appetite is "okay" but provided no details when asked about diet/weight history. Expected disposition at this time is Psychiatric hospital.   Pt on regular diet, 100% meal intake x 1 recorded meal.   Per weight readings, pt with 12.6% weight loss over 2 months, which is significant for time frame. Pt meets criteria for malnutrition.   Medications: folvite, thiamine Labs: BUN 5 (L), Elevated LFTs  UOP: 2.1L x24 hours I/O +2.8L since admit  Per RN edema assessment, pt with non-pitting edema to all extremities   NUTRITION - FOCUSED PHYSICAL EXAM:  Flowsheet Row Most Recent Value  Orbital Region Moderate depletion  Upper Arm Region Severe depletion  Thoracic and Lumbar Region Severe depletion  Buccal Region Moderate depletion  Temple Region Moderate depletion  Clavicle Bone Region Severe depletion  Clavicle and Acromion Bone Region Moderate depletion  Scapular Bone Region Moderate  depletion  Dorsal Hand Mild depletion  Patellar Region Moderate depletion  Anterior Thigh Region Severe depletion  Posterior Calf Region Severe depletion  Edema (RD Assessment) Mild  Hair Reviewed  Eyes Reviewed  Mouth Reviewed  Skin Reviewed  Nails Reviewed       Diet Order:   Diet Order             Diet regular Room service appropriate? Yes; Fluid consistency: Thin  Diet effective now                   EDUCATION NEEDS:   Not appropriate for education at this time  Skin:  Skin Assessment: Reviewed RN Assessment  Last BM:  PTA  Height:   Ht Readings from Last 1 Encounters:  08/01/21 5\' 7"  (1.702 m)    Weight:   Wt Readings from Last 1 Encounters:  08/04/21 69.4 kg    Ideal Body Weight:  67.3 kg  BMI:  Body mass index is 23.96 kg/m.  Estimated Nutritional Needs:   Kcal:  2100-2300  Protein:  105-120 grams  Fluid:  >/= 2.1L    10/04/21, MS, RD, LDN (she/her/hers) RD pager number and weekend/on-call pager number located in Amion.

## 2021-08-04 NOTE — Progress Notes (Signed)
Lab came by to draw from Danny Turner. Danny Turner vehemently refused and stated that he "has not slept in days and will not let them stick me." Lab technician was in the room as well as the officer to witness the refusal. Patient was educated on necessity of obtaining labs but still vehemently declined. No further concerns.

## 2021-08-05 LAB — RESP PANEL BY RT-PCR (FLU A&B, COVID) ARPGX2
Influenza A by PCR: NEGATIVE
Influenza B by PCR: NEGATIVE
SARS Coronavirus 2 by RT PCR: NEGATIVE

## 2021-08-05 LAB — GLUCOSE, CAPILLARY: Glucose-Capillary: 102 mg/dL — ABNORMAL HIGH (ref 70–99)

## 2021-08-05 MED ORDER — ENOXAPARIN SODIUM 40 MG/0.4ML IJ SOSY
40.0000 mg | PREFILLED_SYRINGE | INTRAMUSCULAR | Status: DC
  Administered 2021-08-05: 40 mg via SUBCUTANEOUS
  Filled 2021-08-05 (×2): qty 0.4

## 2021-08-05 MED ORDER — THIAMINE HCL 100 MG PO TABS
100.0000 mg | ORAL_TABLET | Freq: Every day | ORAL | Status: DC
  Administered 2021-08-05: 100 mg via ORAL
  Filled 2021-08-05 (×2): qty 1

## 2021-08-05 NOTE — Progress Notes (Signed)
Patient has been denied by [facility] due to the Pt being currently incarcerated. CSW has been recommended to fax the Pt out to secure appropriate placement. Patient meets inpatient criteria per Rhea Belton, MD. Patient referred to the following facilities:  [facility]  [address], [place] [address] [phone]  [facility]  [address], [place] [address] [phone]  [facility]  [address] [place] [address] [phone]  [facility]  [address] [place] [address] [phone]  [facility]  [address] [place], [place] [address] [phone]  [facility]  [address] [place], [place] [address] [phone]  [facility]  [address] [place] [address] [phone]  [facility]  [address] [place] [address] [phone]  [facility]  [address] [place] [address] [phone]  [facility]  [address], [place] [address] [phone]    CSW will continue to monitor disposition.    Damita Dunnings, MSW, LCSW-A  11:00 AM 08/05/2021

## 2021-08-05 NOTE — Consult Note (Signed)
Reason for Consult: Suicide Attempt via Hanging Referring Physician: Chilton Greathouse MD  BENEDETTO RYDER is an 30 y.o. male.  HPI:  Mr. Prusinski is a 27 yr odl male who presents with polysubstance abuse who presented from [facility] jail after he was found hanging in his cell for approx 15 minutes.  States that he is doing well today.  He reports that he slept well last night.  He reports that he is eating and drinking well.  He reports no SI, HI, or AVH.  He requested to be able to go home and that he would follow-up with outpatient appointments as scheduled for him.  He was able to contract for safety.  Discussed with him that given his ability to contract for safety it would be possible to discharge him if safety planning were able to be done.  He reported no other concerns at present.  History reviewed. No pertinent past medical history.  Past Surgical History:  Procedure Laterality Date   APPENDECTOMY     LEG SURGERY Left     No family history on file.  Social History:  reports that he has been smoking cigarettes. He has been smoking an average of 1 pack per day. He has never used smokeless tobacco. He reports that he does not currently use alcohol. He reports current drug use. Drug: Methamphetamines.  Allergies:  Allergies  Allergen Reactions   Penicillins     Medications: I have reviewed the patient's current medications.  Results for orders placed or performed during the hospital encounter of 08/01/21 (from the past 48 hour(s))  Glucose, capillary     Status: None   Collection Time: 08/03/21 11:13 AM  Result Value Ref Range   Glucose-Capillary 76 70 - 99 mg/dL    Comment: Glucose reference range applies only to samples taken after fasting for at least 8 hours.  Glucose, capillary     Status: Abnormal   Collection Time: 08/03/21  5:02 PM  Result Value Ref Range   Glucose-Capillary 111 (H) 70 - 99 mg/dL    Comment: Glucose reference range applies only to samples taken  after fasting for at least 8 hours.  Glucose, capillary     Status: Abnormal   Collection Time: 08/03/21  7:46 PM  Result Value Ref Range   Glucose-Capillary 100 (H) 70 - 99 mg/dL    Comment: Glucose reference range applies only to samples taken after fasting for at least 8 hours.  Glucose, capillary     Status: None   Collection Time: 08/03/21 11:57 PM  Result Value Ref Range   Glucose-Capillary 95 70 - 99 mg/dL    Comment: Glucose reference range applies only to samples taken after fasting for at least 8 hours.  Glucose, capillary     Status: None   Collection Time: 08/04/21  3:47 AM  Result Value Ref Range   Glucose-Capillary 93 70 - 99 mg/dL    Comment: Glucose reference range applies only to samples taken after fasting for at least 8 hours.  Glucose, capillary     Status: Abnormal   Collection Time: 08/04/21  7:59 AM  Result Value Ref Range   Glucose-Capillary 55 (L) 70 - 99 mg/dL    Comment: Glucose reference range applies only to samples taken after fasting for at least 8 hours.  Glucose, capillary     Status: Abnormal   Collection Time: 08/04/21  8:46 AM  Result Value Ref Range   Glucose-Capillary 108 (H) 70 - 99 mg/dL  Comment: Glucose reference range applies only to samples taken after fasting for at least 8 hours.  CBC     Status: Abnormal   Collection Time: 08/04/21  9:37 AM  Result Value Ref Range   WBC 4.5 4.0 - 10.5 K/uL   RBC 4.25 4.22 - 5.81 MIL/uL   Hemoglobin 12.5 (L) 13.0 - 17.0 g/dL   HCT 27.7 (L) 41.2 - 87.8 %   MCV 88.9 80.0 - 100.0 fL   MCH 29.4 26.0 - 34.0 pg   MCHC 33.1 30.0 - 36.0 g/dL   RDW 67.6 72.0 - 94.7 %   Platelets 161 150 - 400 K/uL   nRBC 0.0 0.0 - 0.2 %    Comment: Performed at [facility], [address] [place], [place] [address]  Basic metabolic panel     Status: Abnormal   Collection Time: 08/04/21  9:37 AM  Result Value Ref Range   Sodium 139 135 - 145 mmol/L   Potassium 4.2 3.5 - 5.1 mmol/L   Chloride 107 98 - 111  mmol/L   CO2 26 22 - 32 mmol/L   Glucose, Bld 122 (H) 70 - 99 mg/dL    Comment: Glucose reference range applies only to samples taken after fasting for at least 8 hours.   BUN 5 (L) 6 - 20 mg/dL   Creatinine, Ser 3.66 0.61 - 1.24 mg/dL   Calcium 8.9 8.9 - 29.4 mg/dL   GFR, Estimated >76 >54 mL/min    Comment: (NOTE) Calculated using the CKD-EPI Creatinine Equation (2021)    Anion gap 6 5 - 15    Comment: Performed at [facility], [address] [place], [place] [address]  Hepatic function panel     Status: Abnormal   Collection Time: 08/04/21  9:37 AM  Result Value Ref Range   Total Protein 6.0 (L) 6.5 - 8.1 g/dL   Albumin 3.1 (L) 3.5 - 5.0 g/dL   AST 465 (H) 15 - 41 U/L   ALT 107 (H) 0 - 44 U/L   Alkaline Phosphatase 26 (L) 38 - 126 U/L   Total Bilirubin 1.8 (H) 0.3 - 1.2 mg/dL   Bilirubin, Direct 0.2 0.0 - 0.2 mg/dL   Indirect Bilirubin 1.6 (H) 0.3 - 0.9 mg/dL    Comment: Performed at [facility], [address], [place], [place] [address]  Glucose, capillary     Status: Abnormal   Collection Time: 08/04/21 11:46 AM  Result Value Ref Range   Glucose-Capillary 107 (H) 70 - 99 mg/dL    Comment: Glucose reference range applies only to samples taken after fasting for at least 8 hours.  Glucose, capillary     Status: Abnormal   Collection Time: 08/04/21  3:30 PM  Result Value Ref Range   Glucose-Capillary 130 (H) 70 - 99 mg/dL    Comment: Glucose reference range applies only to samples taken after fasting for at least 8 hours.  Glucose, capillary     Status: Abnormal   Collection Time: 08/05/21  8:25 AM  Result Value Ref Range   Glucose-Capillary 102 (H) 70 - 99 mg/dL    Comment: Glucose reference range applies only to samples taken after fasting for at least 8 hours.    MR CERVICAL SPINE WO CONTRAST  Result Date: 08/03/2021 CLINICAL DATA:  Neck trauma.  Found hanging. EXAM: MRI CERVICAL SPINE WITHOUT CONTRAST TECHNIQUE: Multiplanar, multisequence MR imaging of  the cervical spine was performed. No intravenous contrast was administered.  COMPARISON:  CTA neck dated August 01, 2021. CT cervical spine dated June 29, 2021. FINDINGS: Alignment: Physiologic. Vertebrae: No fracture, evidence of discitis, or bone lesion. Cord: Normal signal and morphology. Posterior Fossa, vertebral arteries, paraspinal tissues: Diffuse interspinous edema. Otherwise negative. Disc levels: No significant disc bulge or herniation.  No stenosis. IMPRESSION: 1. No acute traumatic injury of the cervical spine. 2. Diffuse interspinous edema, likely reflecting ligamentous strain. Electronically Signed   By: Obie Dredge M.D.   On: 08/03/2021 13:14    Review of Systems  Respiratory:  Negative for shortness of breath.   Cardiovascular:  Negative for chest pain.  Gastrointestinal:  Negative for abdominal pain, constipation, diarrhea, nausea and vomiting.  Musculoskeletal:  Negative for neck pain and neck stiffness.  Neurological:  Negative for weakness and headaches.  Psychiatric/Behavioral:  Negative for agitation, hallucinations and suicidal ideas.   Blood pressure 118/66, pulse 79, temperature 98.7 F (37.1 C), temperature source Oral, resp. rate 20, height 5\' 7"  (1.702 m), weight 69.4 kg, SpO2 96 %. Physical Exam Vitals and nursing note reviewed.  Constitutional:      General: He is not in acute distress.    Appearance: Normal appearance. He is normal weight. He is not ill-appearing or toxic-appearing.  HENT:     Head: Normocephalic and atraumatic.  Pulmonary:     Effort: Pulmonary effort is normal.  Musculoskeletal:        General: Normal range of motion.  Neurological:     General: No focal deficit present.     Mental Status: He is alert.    08/05/21 1134  Presentation  General Appearance Appropriate for Environment;Casual;Fairly Groomed  Eye Contact Good  Speech Clear and Coherent;Normal Rate  Speech Volume Normal  Mood and Affect  Mood Euthymic  Affect  Appropriate;Congruent  Thought Processes  Thought Process Coherent  Descriptions of Associations Intact  Orientation Full (Time, Place and Person)  Thought Content WDL  Hallucinations None  Ideas of Reference None  Suicidal Thoughts No  Homicidal Thoughts No  Sensorium  Memory Immediate Fair;Recent Fair  Judgment Fair  Insight Fair  08/07/21 Fair  Attention Span Fair  Recall [employer] of Knowledge Fair  Language Fair  Psychomotor Activity  Psychomotor Activity Normal  Assets  Assets Resilience  Sleep  Sleep Fair    Assessment/Plan: Case and Plan discussed with Dr. [employer]  Mr. Hassey is a 31 yr odl male who presents with polysubstance abuse who presented from [facility] jail after he was found hanging in his cell for approx 15 minutes.   Patient is doing well today and able to contract for safety.  Have reached out to social worker and if they are able to safety plan with a family member or loved one he would be able to be discharged.  Have contacted primary team to let them know of our recommendation.     Continue rest of care per Primary Team Psychiatry will continue to follow the patient     Disposition: Can be discharged home if safety planning is able to be carried out.   [facility] 08/05/2021, 9:25 AM

## 2021-08-05 NOTE — Evaluation (Signed)
Physical Therapy Evaluation/Discharge Patient Details Name: Danny Turner MRN: [id number] DOB: [birth year] Today's Date: 08/05/2021  History of Present Illness  30 y.o. male admitted 9/9 after being found hanging in jail cell for approximately . Intubated 9/9-9/10. 9/10 CTA head (-) , CT head/neck (-) for acute findings. PMH: psychoactive substance-induced psychosis, acute encephalopathy, and amphetamine abuse   Clinical Impression  Pt tolerated treatment well with VSS throughout. Pt independent with all mobility. Ambulated 300' in hall without AD and completed head turns with gait. Given pt's independence with mobility, pt does not require acute or follow up PT. Discharging from PT. Pt agreeable to PT d/c.       Recommendations for follow up therapy are one component of a multi-disciplinary discharge planning process, led by the attending physician.  Recommendations may be updated based on patient status, additional functional criteria and insurance authorization.  Follow Up Recommendations No PT follow up    Equipment Recommendations  None recommended by PT    Recommendations for Other Services       Precautions / Restrictions Precautions Precautions: Other (comment) Precaution Comments: suicide Restrictions Weight Bearing Restrictions: No      Mobility  Bed Mobility Overal bed mobility: Independent                  Transfers Overall transfer level: Independent                  Ambulation/Gait Ambulation/Gait assistance: Independent Gait Distance (Feet): 300 Feet Assistive device: None Gait Pattern/deviations: WFL(Within Functional Limits)   Gait velocity interpretation: >4.37 ft/sec, indicative of normal walking speed    Stairs            Wheelchair Mobility    Modified Rankin (Stroke Patients Only)       Balance Overall balance assessment: Independent                                           Pertinent  Vitals/Pain      Home Living Family/patient expects to be discharged to:: Other (Comment)                 Additional Comments: Pt was in jail PTA and plan for inpatient psych at D/C    Prior Function Level of Independence: Independent               Hand Dominance        Extremity/Trunk Assessment   Upper Extremity Assessment Upper Extremity Assessment: Overall WFL for tasks assessed    Lower Extremity Assessment Lower Extremity Assessment: Overall WFL for tasks assessed    Cervical / Trunk Assessment Cervical / Trunk Assessment: Normal  Communication   Communication: No difficulties  Cognition                                              General Comments      Exercises     Assessment/Plan    PT Assessment Patent does not need any further PT services  PT Problem List         PT Treatment Interventions      PT Goals (Current goals can be found in the Care Plan section)  Acute Rehab PT Goals PT Goal Formulation: All assessment and education  complete, DC therapy    Frequency     Barriers to discharge        Co-evaluation               AM-PAC PT "6 Clicks" Mobility  Outcome Measure Help needed turning from your back to your side while in a flat bed without using bedrails?: None Help needed moving from lying on your back to sitting on the side of a flat bed without using bedrails?: None Help needed moving to and from a bed to a chair (including a wheelchair)?: None Help needed standing up from a chair using your arms (e.g., wheelchair or bedside chair)?: None Help needed to walk in hospital room?: None Help needed climbing 3-5 steps with a railing? : None 6 Click Score: 24    End of Session   Activity Tolerance: Patient tolerated treatment well Patient left: in chair;with call bell/phone within reach;with nursing/sitter in room Nurse Communication: Mobility status PT Visit Diagnosis: Other abnormalities of gait  and mobility (R26.89)    Time: 9147-8295 PT Time Calculation (min) (ACUTE ONLY): 10 min   Charges:   PT Evaluation $PT Eval Low Complexity: 1 Low          Amedeo Detweiler F, SPT Acute Rehab: [phone]   Vance Gather 08/05/2021, 8:39 AM

## 2021-08-05 NOTE — Progress Notes (Signed)
PROGRESS NOTE    Danny Turner  AVW:[id number] DOB: 1991/02/23 DOA: 08/01/2021 PCP: Pcp, No   Brief Narrative: 30 year old with past medical history significant for psychoactive substance induced psychosis, amphetamine abuse, presents with altered mental status from [place] jail, patient was brought to [facility] on 9/9 after he was found hanging in his cell.  Felt to be handing roughly for 15 minutes.,  Though EMS had to administer 2 mg of intramuscular Versed due to agitation. Was intubated in the ED for airway protection.  CT head angio and neck was negative for any fracture or acute process.  Patient was admitted by CCM.  Patient mental status improved and he was extubated 9/10.  Patient was evaluated by psych, who is recommending inpatient psychiatric admission.    Assessment & Plan:   Active Problems:   Hanging, undetermined whether accidentally or purposely inflicted   Protein-calorie malnutrition, severe  1-Hanging, undetermined whether accidentally or purposely inflicted:  ? Suicidal Attempt;  -He was intubated on admission for airway protection. Extubated 9/10. -Evaluated by psych, who recommend inpatient psych admission initially.  -Patient was IVC 9/12. -Evaluated by Psych 9/13 who relates patient is able to contract for safety, If SW is able to arrange safe discharge plan with family or love one he would be able to be discharge. Also his issues/status with police needs to be clarify.  -MR cervical spine: no acute traumatic injury. Diffuse inter spinose edema, likely reflecting ligamentous strain. Cervical Collar removed.   2-Acute metabolic, toxic encephalopathy, versus anoxic secondary to above injury. -History of polysubstance abuse, UDS was positive for amphetamine even though he was in jail. Mental status improved.  3-Transaminases: Positive HCV antibody Follow trend.  IV negative. HSV PCR ordered.  This will need to be follow-up.  Advised patient that he will  need to see infectious disease outpatient  4-Hypoglycemia: Poor oral intake. Advance diet.      Nutrition Problem: Severe Malnutrition Etiology: social / environmental circumstances    Signs/Symptoms: severe fat depletion, severe muscle depletion, percent weight loss Percent weight loss: 12.6 % (over 2 months)    Interventions: Ensure Enlive (each supplement provides 350kcal and 20 grams of protein)  Estimated body mass index is 23.96 kg/m as calculated from the following:   Height as of this encounter: '5\' 7"'  (1.702 m).   Weight as of this encounter: 69.4 kg.   DVT prophylaxis: Heparin  Code Status: Full Code Family Communication: care discussed with patient  Disposition Plan:  Status is: Inpatient  Remains inpatient appropriate because:Inpatient level of care appropriate due to severity of illness  Dispo: The patient is from:  [place];               Anticipated d/c is to:  Needs inpatient psych facility admission               Patient currently is medically stable to d/c. Needs PT eval   Difficult to place patient No        Consultants:  CCM Psych   Procedures:  Intubation   Antimicrobials:    Subjective: He is alert, eating, denies pain  Objective: Vitals:   08/05/21 0030 08/05/21 0332 08/05/21 0450 08/05/21 0728  BP: 120/75   118/66  Pulse: 76   79  Resp: '18 19  20  ' Temp: 99.2 F (37.3 C) 98.6 F (37 C)  98.7 F (37.1 C)  TempSrc: Oral Oral  Oral  SpO2: 94%   96%  Weight:   69.4 kg  Height:        Intake/Output Summary (Last 24 hours) at 08/05/2021 1724 Last data filed at 08/05/2021 1435 Gross per 24 hour  Intake 1320 ml  Output --  Net 1320 ml    Filed Weights   08/03/21 0500 08/04/21 0500 08/05/21 0450  Weight: 68 kg 69.4 kg 69.4 kg    Examination:  General exam: NAD Respiratory system: CTA Cardiovascular system: S 1, S 2 RRR Gastrointestinal system:  BS present, soft, nt Central nervous system: Alert Extremities: Sno  edema   Data Reviewed: I have personally reviewed following labs and imaging studies  CBC: Recent Labs  Lab 08/01/21 1535 08/01/21 1542 08/01/21 1546 08/02/21 0549 08/04/21 0937  WBC  --  17.0*  --  9.2 4.5  NEUTROABS  --  14.3*  --   --   --   HGB 13.9 14.5 14.3 12.4* 12.5*  HCT 41.0 43.7 42.0 37.0* 37.8*  MCV  --  90.3  --  88.5 88.9  PLT  --  219  --  182 956    Basic Metabolic Panel: Recent Labs  Lab 08/01/21 1542 08/01/21 1546 08/02/21 0549 08/02/21 1308 08/03/21 0309 08/04/21 0937  NA 137 141 140  --  138 139  K 3.2* 3.3* 3.2*  --  3.7 4.2  CL 103 106 112*  --  111 107  CO2 20*  --  20*  --  21* 26  GLUCOSE 211* 207* 94  --  89 122*  BUN 19 21* 19  --  10 5*  CREATININE 1.18 1.00 1.09  --  0.78 0.90  CALCIUM 10.0  --  9.2  --  8.4* 8.9  MG 2.2  --   --  2.2  --   --   PHOS  --   --  2.6  --   --   --     GFR: Estimated Creatinine Clearance: 111.2 mL/min (by C-G formula based on SCr of 0.9 mg/dL). Liver Function Tests: Recent Labs  Lab 08/01/21 1542 08/02/21 0549 08/03/21 0309 08/04/21 0937  AST 91* 89* 71* 113*  ALT 141* 121* 99* 107*  ALKPHOS 35* 26* 23* 26*  BILITOT 2.3* 2.6* 2.7* 1.8*  PROT 7.0 5.8* 5.6* 6.0*  ALBUMIN 3.9 3.2* 3.0* 3.1*    No results for input(s): LIPASE, AMYLASE in the last 168 hours. No results for input(s): AMMONIA in the last 168 hours. Coagulation Profile: Recent Labs  Lab 08/01/21 2247  INR 1.1    Cardiac Enzymes: No results for input(s): CKTOTAL, CKMB, CKMBINDEX, TROPONINI in the last 168 hours. BNP (last 3 results) No results for input(s): PROBNP in the last 8760 hours. HbA1C: No results for input(s): HGBA1C in the last 72 hours.  CBG: Recent Labs  Lab 08/04/21 0759 08/04/21 0846 08/04/21 1146 08/04/21 1530 08/05/21 0825  GLUCAP 55* 108* 107* 130* 102*    Lipid Profile: No results for input(s): CHOL, HDL, LDLCALC, TRIG, CHOLHDL, LDLDIRECT in the last 72 hours.  Thyroid Function Tests: No  results for input(s): TSH, T4TOTAL, FREET4, T3FREE, THYROIDAB in the last 72 hours. Anemia Panel: No results for input(s): VITAMINB12, FOLATE, FERRITIN, TIBC, IRON, RETICCTPCT in the last 72 hours. Sepsis Labs: No results for input(s): PROCALCITON, LATICACIDVEN in the last 168 hours.  Recent Results (from the past 240 hour(s))  Resp Panel by RT-PCR (Flu A&B, Covid) Nasopharyngeal Swab     Status: None   Collection Time: 08/01/21  5:04 PM   Specimen: Nasopharyngeal Swab; Nasopharyngeal(NP) swabs  in vial transport medium  Result Value Ref Range Status   SARS Coronavirus 2 by RT PCR NEGATIVE NEGATIVE Final    Comment: (NOTE) SARS-CoV-2 target nucleic acids are NOT DETECTED.  The SARS-CoV-2 RNA is generally detectable in upper respiratory specimens during the acute phase of infection. The lowest concentration of SARS-CoV-2 viral copies this assay can detect is 138 copies/mL. A negative result does not preclude SARS-Cov-2 infection and should not be used as the sole basis for treatment or other patient management decisions. A negative result may occur with  improper specimen collection/handling, submission of specimen other than nasopharyngeal swab, presence of viral mutation(s) within the areas targeted by this assay, and inadequate number of viral copies(<138 copies/mL). A negative result must be combined with clinical observations, patient history, and epidemiological information. The expected result is Negative.  Fact Sheet for Patients:  EntrepreneurPulse.com.au  Fact Sheet for Healthcare Providers:  IncredibleEmployment.be  This test is no t yet approved or cleared by the [place] FDA and  has been authorized for detection and/or diagnosis of SARS-CoV-2 by FDA under an Emergency Use Authorization (EUA). This EUA will remain  in effect (meaning this test can be used) for the duration of the COVID-19 declaration under Section 564(b)(1) of  the Act, 21 U.S.C.section 360bbb-3(b)(1), unless the authorization is terminated  or revoked sooner.       Influenza A by PCR NEGATIVE NEGATIVE Final   Influenza B by PCR NEGATIVE NEGATIVE Final    Comment: (NOTE) The Xpert Xpress SARS-CoV-2/FLU/RSV plus assay is intended as an aid in the diagnosis of influenza from Nasopharyngeal swab specimens and should not be used as a sole basis for treatment. Nasal washings and aspirates are unacceptable for Xpert Xpress SARS-CoV-2/FLU/RSV testing.  Fact Sheet for Patients: EntrepreneurPulse.com.au  Fact Sheet for Healthcare Providers: IncredibleEmployment.be  This test is not yet approved or cleared by the [place] FDA and has been authorized for detection and/or diagnosis of SARS-CoV-2 by FDA under an Emergency Use Authorization (EUA). This EUA will remain in effect (meaning this test can be used) for the duration of the COVID-19 declaration under Section 564(b)(1) of the Act, 21 U.S.C. section 360bbb-3(b)(1), unless the authorization is terminated or revoked.  Performed at [facility], [place] [address], [place], [place] [address]   MRSA Next Gen by PCR, Nasal     Status: Abnormal   Collection Time: 08/01/21  6:21 PM   Specimen: Nasal Mucosa; Nasal Swab  Result Value Ref Range Status   MRSA by PCR Next Gen DETECTED (A) NOT DETECTED Final    Comment: RESULT CALLED TO, READ BACK BY AND VERIFIED WITH: RN MEL DORMAN 08/01/21'@21' :29 BY TW (NOTE) The GeneXpert MRSA Assay (FDA approved for NASAL specimens only), is one component of a comprehensive MRSA colonization surveillance program. It is not intended to diagnose MRSA infection nor to guide or monitor treatment for MRSA infections. Test performance is not FDA approved in patients less than 72 years old. Performed at [facility], [place] [address], [place], [place] [address]           Radiology Studies: No results  found.      Scheduled Meds:  chlorhexidine gluconate (MEDLINE KIT)  15 mL Mouth Rinse BID   Chlorhexidine Gluconate Cloth  6 each Topical Q0600   enoxaparin (LOVENOX) injection  40 mg Subcutaneous Q24H   feeding supplement  237 mL Oral BID BM   folic acid  1 mg Per Tube Daily  mupirocin ointment  1 application Nasal BID   thiamine  100 mg Oral Daily   Continuous Infusions:     LOS: 4 days    Time spent: 35 minutes    Genese Quebedeaux A Dalton Molesworth, MD [facility]   If 7PM-7AM, please contact night-coverage [email or web address]  08/05/2021, 5:24 PM

## 2021-08-06 DIAGNOSIS — G9341 Metabolic encephalopathy: Secondary | ICD-10-CM

## 2021-08-06 DIAGNOSIS — E43 Unspecified severe protein-calorie malnutrition: Secondary | ICD-10-CM

## 2021-08-06 DIAGNOSIS — B182 Chronic viral hepatitis C: Secondary | ICD-10-CM

## 2021-08-06 DIAGNOSIS — E162 Hypoglycemia, unspecified: Secondary | ICD-10-CM

## 2021-08-06 MED ORDER — FOLIC ACID 1 MG PO TABS: 1.0000 mg | ORAL_TABLET | Freq: Every day | ORAL | 11 refills | Status: AC

## 2021-08-06 MED ORDER — ENSURE ENLIVE PO LIQD: 237.0000 mL | Freq: Two times a day (BID) | ORAL | 12 refills | Status: DC

## 2021-08-06 MED ORDER — THIAMINE HCL 100 MG PO TABS: 100.0000 mg | ORAL_TABLET | Freq: Every day | ORAL | 0 refills | Status: DC

## 2021-08-06 NOTE — Consult Note (Signed)
Reason for Consult: Suicide Attempt via Hanging Referring Physician: Chilton Greathouse MD  Danny Turner is an 30 y.o. male.  HPI:  Danny Turner is a 13 yr odl male who presents with polysubstance abuse who presented from [facility] jail after he was found hanging in his cell for approx 15 minutes.   He reports that he is doing well today.  He reports that he slept well last night.  He reports that he has been eating very well.  He reports no SI, HI, or AVH.  Discussed with him that at this point he is cleared for discharge but the one thing that still needs to happen is safety planning.  Discussed with him social work would need to be able to get in contact with a person who would be staying with him or be able to look after him to do this.  He stated that he would need to call someone but would be able to get this arranged.  Nurse tech who was present in the room said she would message the nurse so that a phone could be provided to the patient.  He has no other concerns at present.   History reviewed. No pertinent past medical history.  Past Surgical History:  Procedure Laterality Date   APPENDECTOMY     LEG SURGERY Left     No family history on file.  Social History:  reports that he has been smoking cigarettes. He has been smoking an average of 1 pack per day. He has never used smokeless tobacco. He reports that he does not currently use alcohol. He reports current drug use. Drug: Methamphetamines.  Allergies:  Allergies  Allergen Reactions   Penicillins     Medications: I have reviewed the patient's current medications.  Results for orders placed or performed during the hospital encounter of 08/01/21 (from the past 48 hour(s))  Glucose, capillary     Status: Abnormal   Collection Time: 08/04/21  7:59 AM  Result Value Ref Range   Glucose-Capillary 55 (L) 70 - 99 mg/dL    Comment: Glucose reference range applies only to samples taken after fasting for at least 8 hours.   Glucose, capillary     Status: Abnormal   Collection Time: 08/04/21  8:46 AM  Result Value Ref Range   Glucose-Capillary 108 (H) 70 - 99 mg/dL    Comment: Glucose reference range applies only to samples taken after fasting for at least 8 hours.  CBC     Status: Abnormal   Collection Time: 08/04/21  9:37 AM  Result Value Ref Range   WBC 4.5 4.0 - 10.5 K/uL   RBC 4.25 4.22 - 5.81 MIL/uL   Hemoglobin 12.5 (L) 13.0 - 17.0 g/dL   HCT 46.2 (L) 70.3 - 50.0 %   MCV 88.9 80.0 - 100.0 fL   MCH 29.4 26.0 - 34.0 pg   MCHC 33.1 30.0 - 36.0 g/dL   RDW 93.8 18.2 - 99.3 %   Platelets 161 150 - 400 K/uL   nRBC 0.0 0.0 - 0.2 %    Comment: Performed at [facility], [address] [place], [place] [address]  Basic metabolic panel     Status: Abnormal   Collection Time: 08/04/21  9:37 AM  Result Value Ref Range   Sodium 139 135 - 145 mmol/L   Potassium 4.2 3.5 - 5.1 mmol/L   Chloride 107 98 - 111 mmol/L   CO2 26 22 - 32 mmol/L   Glucose,  Bld 122 (H) 70 - 99 mg/dL    Comment: Glucose reference range applies only to samples taken after fasting for at least 8 hours.   BUN 5 (L) 6 - 20 mg/dL   Creatinine, Ser 1.61 0.61 - 1.24 mg/dL   Calcium 8.9 8.9 - 09.6 mg/dL   GFR, Estimated >04 >54 mL/min    Comment: (NOTE) Calculated using the CKD-EPI Creatinine Equation (2021)    Anion gap 6 5 - 15    Comment: Performed at [facility], [address] [place], [place] [address]  Hepatic function panel     Status: Abnormal   Collection Time: 08/04/21  9:37 AM  Result Value Ref Range   Total Protein 6.0 (L) 6.5 - 8.1 g/dL   Albumin 3.1 (L) 3.5 - 5.0 g/dL   AST 914 (H) 15 - 41 U/L   ALT 107 (H) 0 - 44 U/L   Alkaline Phosphatase 26 (L) 38 - 126 U/L   Total Bilirubin 1.8 (H) 0.3 - 1.2 mg/dL   Bilirubin, Direct 0.2 0.0 - 0.2 mg/dL   Indirect Bilirubin 1.6 (H) 0.3 - 0.9 mg/dL    Comment: Performed at [facility], [address], [place], [place] [address]  Glucose, capillary      Status: Abnormal   Collection Time: 08/04/21 11:46 AM  Result Value Ref Range   Glucose-Capillary 107 (H) 70 - 99 mg/dL    Comment: Glucose reference range applies only to samples taken after fasting for at least 8 hours.  Glucose, capillary     Status: Abnormal   Collection Time: 08/04/21  3:30 PM  Result Value Ref Range   Glucose-Capillary 130 (H) 70 - 99 mg/dL    Comment: Glucose reference range applies only to samples taken after fasting for at least 8 hours.  Glucose, capillary     Status: Abnormal   Collection Time: 08/05/21  8:25 AM  Result Value Ref Range   Glucose-Capillary 102 (H) 70 - 99 mg/dL    Comment: Glucose reference range applies only to samples taken after fasting for at least 8 hours.  Resp Panel by RT-PCR (Flu A&B, Covid) Nasopharyngeal Swab     Status: None   Collection Time: 08/05/21  3:22 PM   Specimen: Nasopharyngeal Swab; Nasopharyngeal(NP) swabs in vial transport medium  Result Value Ref Range   SARS Coronavirus 2 by RT PCR NEGATIVE NEGATIVE    Comment: (NOTE) SARS-CoV-2 target nucleic acids are NOT DETECTED.  The SARS-CoV-2 RNA is generally detectable in upper respiratory specimens during the acute phase of infection. The lowest concentration of SARS-CoV-2 viral copies this assay can detect is 138 copies/mL. A negative result does not preclude SARS-Cov-2 infection and should not be used as the sole basis for treatment or other patient management decisions. A negative result may occur with  improper specimen collection/handling, submission of specimen other than nasopharyngeal swab, presence of viral mutation(s) within the areas targeted by this assay, and inadequate number of viral copies(<138 copies/mL). A negative result must be combined with clinical observations, patient history, and epidemiological information. The expected result is Negative.  Fact Sheet for Patients:  BloggerCourse.com  Fact Sheet for Healthcare  Providers:  SeriousBroker.it  This test is no t yet approved or cleared by the [place] FDA and  has been authorized for detection and/or diagnosis of SARS-CoV-2 by FDA under an Emergency Use Authorization (EUA). This EUA will remain  in effect (meaning this test can be used) for the duration  of the COVID-19 declaration under Section 564(b)(1) of the Act, 21 U.S.C.section 360bbb-3(b)(1), unless the authorization is terminated  or revoked sooner.       Influenza A by PCR NEGATIVE NEGATIVE   Influenza B by PCR NEGATIVE NEGATIVE    Comment: (NOTE) The Xpert Xpress SARS-CoV-2/FLU/RSV plus assay is intended as an aid in the diagnosis of influenza from Nasopharyngeal swab specimens and should not be used as a sole basis for treatment. Nasal washings and aspirates are unacceptable for Xpert Xpress SARS-CoV-2/FLU/RSV testing.  Fact Sheet for Patients: BloggerCourse.com  Fact Sheet for Healthcare Providers: SeriousBroker.it  This test is not yet approved or cleared by the [place] FDA and has been authorized for detection and/or diagnosis of SARS-CoV-2 by FDA under an Emergency Use Authorization (EUA). This EUA will remain in effect (meaning this test can be used) for the duration of the COVID-19 declaration under Section 564(b)(1) of the Act, 21 U.S.C. section 360bbb-3(b)(1), unless the authorization is terminated or revoked.  Performed at [facility], [address], [place], [place] [address]     No results found.  Review of Systems  Respiratory:  Negative for shortness of breath.   Cardiovascular:  Positive for chest pain (mild from chest compressions).  Gastrointestinal:  Negative for abdominal pain, constipation, diarrhea, nausea and vomiting.  Musculoskeletal:  Negative for neck pain and neck stiffness.  Neurological:  Negative for weakness and headaches.  Psychiatric/Behavioral:   Negative for agitation, behavioral problems, hallucinations and suicidal ideas. The patient is not nervous/anxious.   Blood pressure 118/66, pulse 79, temperature 98.7 F (37.1 C), temperature source Oral, resp. rate 20, height 5\' 7"  (1.702 m), weight 69.8 kg, SpO2 96 %. Physical Exam Vitals and nursing note reviewed.  Constitutional:      General: He is not in acute distress.    Appearance: Normal appearance. He is normal weight. He is not ill-appearing or toxic-appearing.  HENT:     Head: Normocephalic and atraumatic.  Pulmonary:     Effort: Pulmonary effort is normal.  Musculoskeletal:        General: Normal range of motion.  Neurological:     General: No focal deficit present.     Mental Status: He is alert.    08/06/21 0824  Presentation  General Appearance Appropriate for Environment;Casual;Fairly Groomed  Eye Contact Good  Speech Clear and Coherent;Normal Rate  Speech Volume Normal  Mood and Affect  Mood Euthymic  Affect Congruent;Appropriate  Thought Processes  Thought Process Coherent;Goal Directed  Descriptions of Associations Intact  Orientation Full (Time, Place and Person)  Thought Content Logical;WDL  Hallucinations None  Ideas of Reference None  Suicidal Thoughts No  Homicidal Thoughts No  Sensorium  Memory Immediate Fair;Recent Fair  Judgment Fair  Insight Fair  08/08/21 Fair  Attention Span Fair  Recall [employer] of Knowledge Fair  Language Fair  Psychomotor Activity  Psychomotor Activity Normal  Assets  Assets Resilience;Desire for Improvement  Sleep  Sleep Good    Assessment/Plan: Case and Plan discussed with Dr. [employer]  Danny Turner is a 89 yr odl male who presents with polysubstance abuse who presented from [facility] jail after he was found hanging in his cell for approx 15 minutes.     As patient cannot contract for safety he no longer needs inpatient treatment.  Nursing will provide patient with a phone so  that he can contact someone to arrange safety planning.  Once social work is able to do  safety planning patient will be able to discharge.  Will not make any other changes at this time.     Continue rest of care per Primary Team Psychiatry will continue to follow the patient     Disposition: Can be discharged home if safety planning is able to be carried out.    Lauro Franklin 08/06/2021, 7:16 AM

## 2021-08-06 NOTE — Plan of Care (Signed)
Patient ready for discharge. 

## 2021-08-06 NOTE — TOC Progression Note (Addendum)
Transition of Care [facility]) - Initial/Assessment Note    Patient Details  Name: Danny Turner MRN: [id number] Date of Birth: August 10, 1991  Transition of Care [facility]) CM/SW Contact:    Milinda Antis, LCSWA Phone Number: 08/06/2021, 11:59 AM  Clinical Narrative:                 10:51-  CSW met with the patient at bedside.  CSW engaged with the patient about d/c planning.  The patient was oriented x4 and denied any SI or HI.  The patient reported that he "loved himself" and had no current thoughts or plans of harming himself.  The patient also denied any auditory of visual hallucination.  The patient reported that he was ready to go home and has many natural supports lined up to assist him.  CSW informed the patient that the RN reported that the patient is refusing medications and inquired about the patient's reasoning.  The patient reported that he is ready to go home and does not need the "heparin or my blood sugar checked".   The patient reported he spoke with the psychiatrist this morning and as informed that he was cleared to d/c.  11:33-  CSW contacted the psychiatrist and informed them of the concerns noted on the floor during morning meeting and the new psych consult.  The psychiatrist agreed to meet CSW in the patient's room to discuss disposition.   11:36-  CSW and psychiatrist met with the patient at bedside.  The psychiatrist spoke with the patient and informed CSW and the patient that once the patient has a safety plan with a natural support to supervise the patient for at least the next three days, the patient would be cleared by psych to discharge and the IVC could be dropped.  CSW and the patient called the patient's friend Doree Albee.  CSW spoke with Ms. Dorina Hoyer, who was fully aware of why the patient was hospitalized, and asked if Ms Dorina Hoyer would be willing to be the safety support for the patient.  CSW explained what this would entail and the need for the support.  Ms. Dorina Hoyer agreed  and stated that she would come to the hospital now so that she can transport the patient home when he is discharged.  The patient will be living at [place], [place].  11:58-  CSW contacted the psychiatrist and informed of the patient's safety support once discharged.  The psychiatrist messaged CSW that since the safety plan is done the IVC can be dropped and the patient is psych cleared for d/c.  12:45-  CSW contacted [place],  [phone] and gave him the address where the patient will be residing.  The officer informed CSW that he will need to see the patient prior to d/c and can come after 1600.  13:24-  CSW informed the care team of the above information and is waiting for attending to report if the patient is medically ready to d/c so that CSW can inform the officer.   14:00-  CSW gave the RN the contact information for Officer Ginter so that the RN could update the officer on d/c information.  CSW also called Officer Ginter and left a VM with the name of the RN who will be contacting him with discharge info.  CSW sent the magistrate the Notice of Commitment Change to end IVC.  14:50-  CSW received information from the RN that the patient is ready to d/c and that they  are trying to get in contact with the patient's friend.    14:57-  CSW contacted the patient's friend, Gerre Pebbles, [phone].  CSW informed Ms. Frutoso Chase that the patient is ready to d/c.  Ms. Frutoso Chase reported that she will be at the hospital at 16:30 at the latest.  [place] signing off.  Please consult should any other needs arise.    Expected Discharge Plan: [facility] Barriers to Discharge: Continued Medical Work up   Patient Goals and CMS Choice     Choice offered to / list presented to : Patient  Expected Discharge Plan and Services Expected Discharge Plan: [facility] In-house Referral: Clinical Social Work Discharge Planning Services: CM Consult    Living arrangements for the past 2 months: Artist                                      Prior Living Arrangements/Services Living arrangements for the past 2 months: Artist                     Activities of Daily Living      Permission Sought/Granted                  Emotional Assessment              Admission diagnosis:  Suicide attempt ([place]) [T14.91XA] Hanging, undetermined whether accidentally or purposely inflicted, initial encounter [T71.164A] Hanging, undetermined whether accidentally or purposely inflicted [C76.394V] Patient Active Problem List   Diagnosis Date Noted   Protein-calorie malnutrition, severe 08/04/2021   Hanging, undetermined whether accidentally or purposely inflicted 20/01/7943   Acute encephalopathy    Acute hypoxemic respiratory failure ([place])    Hypokalemia    Transaminitis    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

## 2021-08-06 NOTE — Progress Notes (Signed)
Officer Court Joy will come to place ankle bracelet prior to discharge home

## 2021-08-07 LAB — HEPATITIS C GENOTYPE: Hepatitis C Genotype: 3

## 2021-08-07 LAB — HCV RNA QUANT RFLX ULTRA OR GENOTYP
HCV RNA Qnt(log copy/mL): 6.559 log10 IU/mL
HepC Qn: 3620000 IU/mL

## 2021-08-18 NOTE — Discharge Summary (Signed)
Physician Discharge Summary  Danny Turner ZPS:[id number] DOB: 31-Oct-1991 DOA: 08/01/2021  PCP: Pcp, No  Admit date: 08/01/2021 Discharge date: 08/06/2021  Recommendations for Outpatient Follow-up:  Discharge to home to care of family. Follow up with PCP in 7-10 days. Follow up on Hepatitis C PCR. See outpatient rehab for polysubstance abuse. Regular diet.   Discharge Diagnoses: Principal diagnosis is #1 Hanging - undetermined whether accidentally or purposely inflicted Protein calorie malnutrition - severe  Acute metabolic, toxic encephalopathy, versus anoxic secondary to above injury. Transaminases: Positive HCV antibody Hypoglycemia  Discharge Condition: Fair  Disposition: Home under the supervision and care of family.  Diet recommendation: Regular  Filed Weights   08/04/21 0500 08/05/21 0450 08/06/21 0424  Weight: 69.4 kg 69.4 kg 69.8 kg    History of present illness:  Danny Turner is a 30 y.o. male who has a PMH as below and who is incarcerated at [facility].  He was brought to [facility] 9/9 after he was found hanging in his cell.  Felt to be hanging roughly 15 minutes; though EMS had to administer 2mg  IM Versed due to agitation and pt thrashing.   In ED, he was intubated for airway protection . He had CT angio head / neck that was negative for any fx or acute process.  Medical history significant for psychoactive substance induced psychosis, amphetamine abuse, acute hypoxemic respiratory failure, hypokalemia, transaminitis, and acute encephalopathy.   PCCM asked to see for admission.  Upon our evaluation he was on 10 Propofol and 50 Fentanyl and was completely unresponsive with no cough or gag and sluggish pupillary reflex.  Sedation was stopped by Dr. to assess for any changes in his neuro exam.  Hospital Course:  The patient was admitted to the ICU. He had been intubated in the ED for airway protection. UDS was positive for amphetamines despite the fact that he  was admitted from [place] jail. CTA of head and neck was negative for any fracture or acute process. Initially the patient was on propofol and fentanyl and was completely unresponsive with no cough or gag and sluggish pupillary reflex. Sedation was stopped. The patient's mental status improved the day after admission. He was successfully extubated. He remained in a c-collar due to cervical tenderness.  The patient was transferred out of the ICU on 08/04/2021. The patient was re-evaluated by psychiatry on that day. An MRI of the C-spine was performed and was negative for acute traumatic injury. There was however diffuse interspinous edema, likely reflecting ligamentous strain. Cervical Collar removed. His mental status continued to improve. Hepatitis C was positive. The patient was advised that he would need to follow up with infectious disease as outpatient. He had episodes of hypoglycemia due to poor PO intake. Diet was advanced and liberated.   Psyche re-eval was performed and the opinion was that the patient could contract for safety and could be discharged to a safe environment. Arrangements were made for the patient to go home with family. Police came to fit the patient with an ankle bracelet prior to discharge.    Today's assessment: S: The patient is awake, alert, and oriented x 3.  O: Vitals:  Vitals:   08/06/21 1143 08/06/21 1541  BP:    Pulse:    Resp:    Temp: 98.8 F (37.1 C) 98.7 F (37.1 C)  SpO2:      Constitutional:  The patient is awake, alert, and oriented x 3. No acute distress Respiratory:  CTA bilaterally, no  w/r/r.  Respiratory effort normal. No retractions or accessory muscle use Cardiovascular:  RRR, no m/r/g No LE extremity edema   Normal pedal pulses Abdomen:  Abdomen appears normal; no tenderness or masses No hernias No HSM Musculoskeletal:  Digits/nails: no clubbing, cyanosis, petechiae, infection exam of joints, bones, muscles of at least one of  following: head/neck, RUE, LUE, RLE, LLE   strength and tone normal, no atrophy, no abnormal movements No tenderness, masses Normal ROM, no contractures  gait and station Skin:  No rashes, lesions, ulcers palpation of skin: no induration or nodules Neurologic:  CN 2-12 intact Sensation all 4 extremities intact Psychiatric:  judgement and insight appear normal Mental status Mood, affect appropriate Orientation to person, place, time  Discharge Instructions  Discharge Instructions     Activity as tolerated - No restrictions   Complete by: As directed    Call MD for:  persistant nausea and vomiting   Complete by: As directed    Call MD for:  temperature >100.4   Complete by: As directed    Diet general   Complete by: As directed    Discharge instructions   Complete by: As directed    Discharge to home to care of family. Follow up with PCP in 7-10 days. Follow up on Hepatitis C PCR. See outpatient rehab for polysubstance abuse. Regular diet.   Increase activity slowly   Complete by: As directed       Allergies as of 08/06/2021       Reactions   Penicillins         Medication List     TAKE these medications    feeding supplement Liqd Take 237 mLs by mouth 2 (two) times daily between meals.   folic acid 1 MG tablet Commonly known as: FOLVITE Take 1 tablet (1 mg total) by mouth daily.   thiamine 100 MG tablet Take 1 tablet (100 mg total) by mouth daily.       Allergies  Allergen Reactions   Penicillins     The results of significant diagnostics from this hospitalization (including imaging, microbiology, ancillary and laboratory) are listed below for reference.    Significant Diagnostic Studies: CT Angio Head W or Wo Contrast  Result Date: 08/01/2021 CLINICAL DATA:  Neck trauma, arterial injury suspected.  Hanging. EXAM: CT ANGIOGRAPHY HEAD AND NECK TECHNIQUE: Multidetector CT imaging of the head and neck was performed using the standard protocol  during bolus administration of intravenous contrast. Multiplanar CT image reconstructions and MIPs were obtained to evaluate the vascular anatomy. Carotid stenosis measurements (when applicable) are obtained utilizing NASCET criteria, using the distal internal carotid diameter as the denominator. CONTRAST:  4mL OMNIPAQUE IOHEXOL 350 MG/ML SOLN COMPARISON:  CT head and cervical spine 06/29/2021 FINDINGS: CT HEAD FINDINGS Brain: There is no evidence of an acute infarct, intracranial hemorrhage, mass, midline shift, or extra-axial fluid collection. The ventricles and sulci are normal. Vascular: No hyperdense vessel. Skull: No acute fracture or suspicious osseous lesion. Sinuses: Tiny mucous retention cyst in the left maxillary sinus. Clear mastoid air cells. Orbits: Remote left medial orbital blowout fracture. Review of the MIP images confirms the above findings CTA NECK FINDINGS Aortic arch: Standard 3 vessel aortic arch with widely patent arch vessel origins. Right carotid system: Patent and smooth without evidence of stenosis or dissection. Left carotid system: Patent and smooth without evidence of stenosis or dissection. Vertebral arteries: Patent and smooth without evidence of stenosis or dissection. Dominant left vertebral artery. Skeleton: No acute osseous  abnormality or suspicious osseous lesion. Other neck: Endotracheal and enteric tubes in place. No evidence of cervical lymphadenopathy or mass. Upper chest: Clear lung apices. Review of the MIP images confirms the above findings CTA HEAD FINDINGS Anterior circulation: The internal carotid arteries are widely patent from skull base to carotid termini. ACAs and MCAs are patent without evidence of a proximal branch occlusion or significant proximal stenosis. The right A1 segment is hypoplastic Posterior circulation: The intracranial vertebral arteries are patent to the basilar with the right being diminutive distal to the PICA origin. The basilar artery is  widely patent. There are robust posterior communicating arteries bilaterally with hypoplasia of the right P1 segment. Both PCAs are patent without evidence of a significant proximal stenosis. No aneurysm is identified. Venous sinuses: There are multiple mildly enlarged veins in the interpeduncular and prepontine cisterns without a visible vascular nidus or clearly enlarged artery, and this likely reflects altered venous drainage given hypoplastic transverse and sigmoid sinuses bilaterally. An enlarged occipital sinus is also noted. Anatomic variants: Fetal right PCA. Review of the MIP images confirms the above findings IMPRESSION: 1. No evidence of acute intracranial abnormality. 2. Widely patent cervical carotid and vertebral arteries. No evidence of acute arterial injury. 3. No major intracranial arterial occlusion, significant stenosis, or aneurysm. Electronically Signed   By: Sebastian Ache M.D.   On: 08/01/2021 15:41   CT Angio Neck W and/or Wo Contrast  Result Date: 08/01/2021 CLINICAL DATA:  Neck trauma, arterial injury suspected.  Hanging. EXAM: CT ANGIOGRAPHY HEAD AND NECK TECHNIQUE: Multidetector CT imaging of the head and neck was performed using the standard protocol during bolus administration of intravenous contrast. Multiplanar CT image reconstructions and MIPs were obtained to evaluate the vascular anatomy. Carotid stenosis measurements (when applicable) are obtained utilizing NASCET criteria, using the distal internal carotid diameter as the denominator. CONTRAST:  28mL OMNIPAQUE IOHEXOL 350 MG/ML SOLN COMPARISON:  CT head and cervical spine 06/29/2021 FINDINGS: CT HEAD FINDINGS Brain: There is no evidence of an acute infarct, intracranial hemorrhage, mass, midline shift, or extra-axial fluid collection. The ventricles and sulci are normal. Vascular: No hyperdense vessel. Skull: No acute fracture or suspicious osseous lesion. Sinuses: Tiny mucous retention cyst in the left maxillary sinus. Clear  mastoid air cells. Orbits: Remote left medial orbital blowout fracture. Review of the MIP images confirms the above findings CTA NECK FINDINGS Aortic arch: Standard 3 vessel aortic arch with widely patent arch vessel origins. Right carotid system: Patent and smooth without evidence of stenosis or dissection. Left carotid system: Patent and smooth without evidence of stenosis or dissection. Vertebral arteries: Patent and smooth without evidence of stenosis or dissection. Dominant left vertebral artery. Skeleton: No acute osseous abnormality or suspicious osseous lesion. Other neck: Endotracheal and enteric tubes in place. No evidence of cervical lymphadenopathy or mass. Upper chest: Clear lung apices. Review of the MIP images confirms the above findings CTA HEAD FINDINGS Anterior circulation: The internal carotid arteries are widely patent from skull base to carotid termini. ACAs and MCAs are patent without evidence of a proximal branch occlusion or significant proximal stenosis. The right A1 segment is hypoplastic Posterior circulation: The intracranial vertebral arteries are patent to the basilar with the right being diminutive distal to the PICA origin. The basilar artery is widely patent. There are robust posterior communicating arteries bilaterally with hypoplasia of the right P1 segment. Both PCAs are patent without evidence of a significant proximal stenosis. No aneurysm is identified. Venous sinuses: There are multiple mildly enlarged  veins in the interpeduncular and prepontine cisterns without a visible vascular nidus or clearly enlarged artery, and this likely reflects altered venous drainage given hypoplastic transverse and sigmoid sinuses bilaterally. An enlarged occipital sinus is also noted. Anatomic variants: Fetal right PCA. Review of the MIP images confirms the above findings IMPRESSION: 1. No evidence of acute intracranial abnormality. 2. Widely patent cervical carotid and vertebral arteries. No  evidence of acute arterial injury. 3. No major intracranial arterial occlusion, significant stenosis, or aneurysm. Electronically Signed   By: Sebastian Ache M.D.   On: 08/01/2021 15:41   MR CERVICAL SPINE WO CONTRAST  Result Date: 08/03/2021 CLINICAL DATA:  Neck trauma.  Found hanging. EXAM: MRI CERVICAL SPINE WITHOUT CONTRAST TECHNIQUE: Multiplanar, multisequence MR imaging of the cervical spine was performed. No intravenous contrast was administered. COMPARISON:  CTA neck dated August 01, 2021. CT cervical spine dated June 29, 2021. FINDINGS: Alignment: Physiologic. Vertebrae: No fracture, evidence of discitis, or bone lesion. Cord: Normal signal and morphology. Posterior Fossa, vertebral arteries, paraspinal tissues: Diffuse interspinous edema. Otherwise negative. Disc levels: No significant disc bulge or herniation.  No stenosis. IMPRESSION: 1. No acute traumatic injury of the cervical spine. 2. Diffuse interspinous edema, likely reflecting ligamentous strain. Electronically Signed   By: Obie Dredge M.D.   On: 08/03/2021 13:14   DG CHEST PORT 1 VIEW  Result Date: 08/03/2021 CLINICAL DATA:  Respiratory failure.  Suicide attempt. EXAM: PORTABLE CHEST 1 VIEW COMPARISON:  08/01/2021 FINDINGS: Numerous leads and wires project over the chest. Midline trachea. Normal heart size. No pleural effusion or pneumothorax. Extubation and removal of nasogastric tube. Diffuse peribronchial thickening. Suspect medial left lower lobe volume loss and retrocardiac airspace disease. IMPRESSION: Left lower lobe volume loss and airspace disease, most likely atelectasis. Cannot exclude early or mild infection. Peribronchial thickening which may relate to chronic bronchitis or smoking. Electronically Signed   By: Jeronimo Greaves M.D.   On: 08/03/2021 08:44   DG Chest Port 1 View  Result Date: 08/01/2021 CLINICAL DATA:  Intubation, endotracheal tube placement EXAM: PORTABLE CHEST 1 VIEW COMPARISON:  07/11/2021 FINDINGS:  Interval placement of endotracheal tube, tip above the carina. Esophagogastric tube with tip and side port below the diaphragm. No acute abnormality of the lungs. The heart and mediastinum are normal. IMPRESSION: 1. Interval placement of endotracheal tube, appropriately positioned with tip above the carina. Esophagogastric tube with tip and side port below the diaphragm. 2. No acute abnormality of the lungs. Electronically Signed   By: Lauralyn Primes M.D.   On: 08/01/2021 15:52    Microbiology: No results found for this or any previous visit (from the past 240 hour(s)).   Labs: Basic Metabolic Panel: No results for input(s): NA, K, CL, CO2, GLUCOSE, BUN, CREATININE, CALCIUM, MG, PHOS in the last 168 hours. Liver Function Tests: No results for input(s): AST, ALT, ALKPHOS, BILITOT, PROT, ALBUMIN in the last 168 hours. No results for input(s): LIPASE, AMYLASE in the last 168 hours. No results for input(s): AMMONIA in the last 168 hours. CBC: No results for input(s): WBC, NEUTROABS, HGB, HCT, MCV, PLT in the last 168 hours. Cardiac Enzymes: No results for input(s): CKTOTAL, CKMB, CKMBINDEX, TROPONINI in the last 168 hours. BNP: BNP (last 3 results) No results for input(s): BNP in the last 8760 hours.  ProBNP (last 3 results) No results for input(s): PROBNP in the last 8760 hours.  CBG: No results for input(s): GLUCAP in the last 168 hours.  Active Problems:   Hanging, undetermined whether  accidentally or purposely inflicted   Protein-calorie malnutrition, severe   Time coordinating discharge: 38 minutes.  Signed:        Herbert Aguinaldo, DO [facility]  08/18/2021, 5:37 PM

## 2021-10-13 ENCOUNTER — Emergency Department
Admission: EM | Admit: 2021-10-13 | Discharge: 2021-10-13 | Disposition: A | Discharge status: Home / Self Care | Payer: Self-pay | Attending: Emergency Medicine | Admitting: Emergency Medicine

## 2021-10-13 ENCOUNTER — Other Ambulatory Visit: Payer: Self-pay

## 2021-10-13 ENCOUNTER — Encounter: Payer: Self-pay | Admitting: Emergency Medicine

## 2021-10-13 DIAGNOSIS — F1510 Other stimulant abuse, uncomplicated: Secondary | ICD-10-CM | POA: Diagnosis present

## 2021-10-13 DIAGNOSIS — F32A Depression, unspecified: Secondary | ICD-10-CM | POA: Insufficient documentation

## 2021-10-13 DIAGNOSIS — F1910 Other psychoactive substance abuse, uncomplicated: Secondary | ICD-10-CM

## 2021-10-13 DIAGNOSIS — R451 Restlessness and agitation: Secondary | ICD-10-CM

## 2021-10-13 DIAGNOSIS — T71162A Asphyxiation due to hanging, intentional self-harm, initial encounter: Secondary | ICD-10-CM | POA: Insufficient documentation

## 2021-10-13 DIAGNOSIS — T71164A Asphyxiation due to hanging, undetermined, initial encounter: Secondary | ICD-10-CM | POA: Diagnosis present

## 2021-10-13 DIAGNOSIS — F19959 Other psychoactive substance use, unspecified with psychoactive substance-induced psychotic disorder, unspecified: Secondary | ICD-10-CM | POA: Diagnosis present

## 2021-10-13 DIAGNOSIS — F17210 Nicotine dependence, cigarettes, uncomplicated: Secondary | ICD-10-CM | POA: Insufficient documentation

## 2021-10-13 DIAGNOSIS — L989 Disorder of the skin and subcutaneous tissue, unspecified: Secondary | ICD-10-CM | POA: Insufficient documentation

## 2021-10-13 LAB — CBC WITH DIFFERENTIAL/PLATELET
Abs Immature Granulocytes: 0.03 10*3/uL (ref 0.00–0.07)
Basophils Absolute: 0.1 10*3/uL (ref 0.0–0.1)
Basophils Relative: 0 %
Eosinophils Absolute: 0.2 10*3/uL (ref 0.0–0.5)
Eosinophils Relative: 1 %
HCT: 39.1 % (ref 39.0–52.0)
Hemoglobin: 13.7 g/dL (ref 13.0–17.0)
Immature Granulocytes: 0 %
Lymphocytes Relative: 24 %
Lymphs Abs: 2.8 10*3/uL (ref 0.7–4.0)
MCH: 30 pg (ref 26.0–34.0)
MCHC: 35 g/dL (ref 30.0–36.0)
MCV: 85.6 fL (ref 80.0–100.0)
Monocytes Absolute: 1.1 10*3/uL — ABNORMAL HIGH (ref 0.1–1.0)
Monocytes Relative: 9 %
Neutro Abs: 7.9 10*3/uL — ABNORMAL HIGH (ref 1.7–7.7)
Neutrophils Relative %: 66 %
Platelets: 255 10*3/uL (ref 150–400)
RBC: 4.57 MIL/uL (ref 4.22–5.81)
RDW: 12.8 % (ref 11.5–15.5)
WBC: 12 10*3/uL — ABNORMAL HIGH (ref 4.0–10.5)
nRBC: 0 % (ref 0.0–0.2)

## 2021-10-13 LAB — COMPREHENSIVE METABOLIC PANEL
ALT: 82 U/L — ABNORMAL HIGH (ref 0–44)
AST: 48 U/L — ABNORMAL HIGH (ref 15–41)
Albumin: 4.7 g/dL (ref 3.5–5.0)
Alkaline Phosphatase: 36 U/L — ABNORMAL LOW (ref 38–126)
Anion gap: 11 (ref 5–15)
BUN: 29 mg/dL — ABNORMAL HIGH (ref 6–20)
CO2: 24 mmol/L (ref 22–32)
Calcium: 9.7 mg/dL (ref 8.9–10.3)
Chloride: 106 mmol/L (ref 98–111)
Creatinine, Ser: 0.82 mg/dL (ref 0.61–1.24)
GFR, Estimated: >60 mL/min (ref >60)
Glucose, Bld: 96 mg/dL (ref 70–99)
Potassium: 3.5 mmol/L (ref 3.5–5.1)
Sodium: 141 mmol/L (ref 135–145)
Total Bilirubin: 2.5 mg/dL — ABNORMAL HIGH (ref 0.3–1.2)
Total Protein: 8 g/dL (ref 6.5–8.1)

## 2021-10-13 LAB — SALICYLATE LEVEL: Salicylate Lvl: 10.4 mg/dL (ref 7.0–30.0)

## 2021-10-13 LAB — CK: Total CK: 354 U/L (ref 49–397)

## 2021-10-13 LAB — ETHANOL: Alcohol, Ethyl (B): <10 mg/dL (ref <10)

## 2021-10-13 LAB — HIV ANTIBODY (ROUTINE TESTING W REFLEX): HIV Screen 4th Generation wRfx: NONREACTIVE

## 2021-10-13 LAB — ACETAMINOPHEN LEVEL: Acetaminophen (Tylenol), Serum: <10 ug/mL — ABNORMAL LOW (ref 10–30)

## 2021-10-13 MED ORDER — DROPERIDOL 2.5 MG/ML IJ SOLN
5.0000 mg | Freq: Once | INTRAMUSCULAR | Status: AC
  Administered 2021-10-13: 5 mg via INTRAMUSCULAR
  Filled 2021-10-13: qty 2

## 2021-10-13 MED ORDER — MIDAZOLAM HCL 2 MG/2ML IJ SOLN
2.0000 mg | Freq: Once | INTRAMUSCULAR | Status: AC
  Administered 2021-10-13: 2 mg via INTRAMUSCULAR
  Filled 2021-10-13: qty 2

## 2021-10-13 NOTE — BH Assessment (Signed)
Comprehensive Clinical Assessment (CCA) Note  10/13/2021 Gurtej Noyola Soza [id number]  Herminio Kniskern, 30 year old male who presents to [facility] ED voluntarily for treatment. Per triage note, Pt to ED via POV states sores on either side of his face x 2 days, states feels like they are abscesses, pt also feels like he has bumps on his hands that are bleeding. Pt notably anxious, unable to stop moving at triage desk.    During TTS assessment pt presents alert and oriented x 4, anxious yet partly cooperative, and mood-congruent with affect. The pt does not appear to be responding to internal or external stimuli. Neither is the pt presenting with any delusional thinking. Pt verified the information provided to triage RN.   Pt identifies his main complaint to be that he has sores on his face and tongue. Patient states he is "depressed and having a bad day." Patient reports the sores have been on his face for a few days which is also how long he has been depressed. When asked why he is depressed, patient responds "life". Patient states he is not working and recently released from prison about a month ago. Patient is currently wearing an ankle monitor. Patient reports taking Zoloft while incarcerated; however, he is not compliant with taking his meds now. Patient reports INPT hx but is not seeing a therapist for outpatient treatment. Patient refused to answer further questions and has yet to provide a urine sample. Patient "fell alsleep" and would not respond.    Disposition pending   Chief Complaint:  Chief Complaint  Patient presents with   Abscess   Visit Diagnosis: diagnosis deferred    CCA Screening, Triage and Referral (STR)  Patient Reported Information How did you hear about [place]? Self  Referral name: No data recorded Referral phone number: No data recorded  Whom do you see for routine medical problems? No data recorded Practice/Facility Name: No data recorded Practice/Facility Phone Number:  No data recorded Name of Contact: No data recorded Contact Number: No data recorded Contact Fax Number: No data recorded Prescriber Name: No data recorded Prescriber Address (if known): No data recorded  What Is the Reason for Your Visit/Call Today? Patient reports he came to the ED because he has sores on his face and he is depressed.  How Long Has This Been Causing You Problems? <Week  What Do You Feel Would Help You the Most Today? Treatment for Depression or other mood problem; Medication(s)   Have You Recently Been in Any Inpatient Treatment (Hospital/Detox/Crisis Center/28-Day Program)? No data recorded Name/Location of Program/Hospital:No data recorded How Long Were You There? No data recorded When Were You Discharged? No data recorded  Have You Ever Received Services From [facility] Before? No data recorded Who Do You See at [facility]? No data recorded  Have You Recently Had Any Thoughts About Hurting Yourself? No  Are You Planning to Commit Suicide/Harm Yourself At This time? No   Have you Recently Had Thoughts About Hurting Someone Karolee Ohs? No  Explanation: No data recorded  Have You Used Any Alcohol or Drugs in the Past 24 Hours? No data recorded How Long Ago Did You Use Drugs or Alcohol? No data recorded What Did You Use and How Much? No data recorded  Do You Currently Have a Therapist/Psychiatrist? No  Name of Therapist/Psychiatrist: No data recorded  Have You Been Recently Discharged From Any Office Practice or Programs? No  Explanation of Discharge From Practice/Program: No data recorded    CCA  Screening Triage Referral Assessment Type of Contact: Face-to-Face  Is this Initial or Reassessment? No data recorded Date Telepsych consult ordered in [facility]:  No data recorded Time Telepsych consult ordered in [facility]:  No data recorded  Patient Reported Information Reviewed? No data recorded Patient Left Without Being Seen? No data recorded Reason for Not  Completing Assessment: No data recorded  Collateral Involvement: None provided   Does Patient Have a Court Appointed Legal Guardian? No data recorded Name and Contact of Legal Guardian: No data recorded If Minor and Not Living with Parent(s), Who has Custody? n/a  Is CPS involved or ever been involved? Never  Is APS involved or ever been involved? Never   Patient Determined To Be At Risk for Harm To Self or Others Based on Review of Patient Reported Information or Presenting Complaint? No  Method: No data recorded Availability of Means: No data recorded Intent: No data recorded Notification Required: No data recorded Additional Information for Danger to Others Potential: No data recorded Additional Comments for Danger to Others Potential: No data recorded Are There Guns or Other Weapons in Your Home? No data recorded Types of Guns/Weapons: No data recorded Are These Weapons Safely Secured?                            No data recorded Who Could Verify You Are Able To Have These Secured: No data recorded Do You Have any Outstanding Charges, Pending Court Dates, Parole/Probation? No data recorded Contacted To Inform of Risk of Harm To Self or Others: No data recorded  Location of Assessment: [facility] ED   Does Patient Present under Involuntary Commitment? No  IVC Papers Initial File Date: No data recorded  [place] of Residence: [place]   Patient Currently Receiving the Following Services: Not Receiving Services   Determination of Need: Urgent (48 hours)   Options For Referral: Chemical Dependency Intensive Outpatient Therapy (CDIOP); Outpatient Therapy; Medication Management  Recommendations for Services/Supports/Treatments:    DSM5 Diagnoses: Patient Active Problem List   Diagnosis Date Noted   Protein-calorie malnutrition, severe 08/04/2021   Hanging, undetermined whether accidentally or purposely inflicted 08/01/2021   Acute encephalopathy    Acute hypoxemic  respiratory failure (HCC)    Hypokalemia    Transaminitis    Psychoactive substance-induced psychosis (HCC) 06/30/2021   Amphetamine abuse (HCC) 06/30/2021    Patient Centered Plan: Patient is on the following Treatment Plan(s):     Referrals to Alternative Service(s): Referred to Alternative Service(s):   Place:   Date:   Time:    Referred to Alternative Service(s):   Place:   Date:   Time:    Referred to Alternative Service(s):   Place:   Date:   Time:    Referred to Alternative Service(s):   Place:   Date:   Time:     Katina Remick Dierdre Searles, Counselor, LCAS-A

## 2021-10-13 NOTE — ED Notes (Signed)
Resting peacefully in stretcher at this time. Placed on all monitoring cords and being monitored.

## 2021-10-13 NOTE — ED Notes (Signed)
This rn tried to call this pt's parole officer at [phone] , no one answered so a voice message was left .

## 2021-10-13 NOTE — ED Notes (Signed)
Pt refusing to give urine specimen at this time

## 2021-10-13 NOTE — ED Notes (Signed)
Pt stated that he is still having thoughts of hurting himself at this time. Pt reported that he wants to stay for his mental health.

## 2021-10-13 NOTE — ED Provider Notes (Signed)
Patient evaluated by psychiatry. Feel patient is safe for discharge home.   Phineas Semen, MD 10/13/21 2231

## 2021-10-13 NOTE — ED Notes (Signed)
Pt given breakfast tray at this time. 

## 2021-10-13 NOTE — Consult Note (Signed)
[facility] Face-to-Face Psychiatry Consult   Reason for Consult: Abscess Referring Physician: Dr. Sidney Ace Patient Identification: Danny Turner MRN:  [id number] Principal Diagnosis: <principal problem not specified> Diagnosis:  Active Problems:   Psychoactive substance-induced psychosis (HCC)   Amphetamine abuse (HCC)   Hanging, undetermined whether accidentally or purposely inflicted   Total Time spent with patient: 1 hour  Subjective: " Your want to discharge me." Danny Turner is a 30 y.o. male patient presented to [facility] ED via POV voluntary. The patient was evaluated. He is refusing to answer questions or comply with the psychiatric assessment. The patient has an electronic ankle monitor on his right leg.  The patient was seen face-to-face by this provider; the chart was reviewed and consulted with Dr. Derrill Kay on 10/13/2021 due to the patient's care. It was discussed with the EDP that the patient does not meet the criteria to be admitted to the psychiatric inpatient unit.  On evaluation, the patient is alert and oriented x 4, calm uncooperative, and mood-congruent with affect.  The patient does not appear to be responding to internal or external stimuli. Neither is the patient presenting with any delusional thinking. The patient denies auditory or visual hallucinations. The patient admits to suicidal ideation without a plan but denies homicidal or self-harm ideations. The patient is not presenting with any psychotic or paranoid behaviors.  The patient reports that he is here because he is "suicidal." The patient is currently homeless and is suicidal for secondary gain. The patient has presented to the ED once discharged from jail, but he refuses to participate in the assessment process and give urine for his UDS.  Per the previous note, "Patient currently wants help but also lacks decision-making capacity due to what appears to be methamphetamine abuse.  He is willing to seek treatment." The  patient refuses to say whether he wants substance abuse treatment. The patient is malingering and does not meet the criteria. The patient shared that he has court on 10/21/21.   Patient ongoing endorsement of suicidal ideation shows clear evidence of secondary gain of unmet needs for housing, that is representative of limited and often- maladaptive coping skills and rather than an indicator of imminent risk of death.  Evidence indicates that subsequent suicide attempts by patients who made contingent suicide threats (defined as threatened suicide or exaggerated suicidality) are uncommon in both groups.  Hospitalization should not be used as a substitute for social services, substance abuse treatment, and legal assistance for patients who make contingent suicide threats (Characteristics and six-month outcome of patients who use suicide threats to seek hospital admission.  (1966). Psychiatric Services, 47(8), [phone]. (DOI: 10.1176/ps.47.8.871).   Patient has not benefited from past hospitalization under similar circumstances in terms of suicide risk modification or improvement in mental health or social conditions.  Patients repeated use of emergency department and inpatient services instead of recommended outpatient follow-up is ineffective in helping improvement.  Inpatient hospitalization is not indicated, and the patient is refusing interventions that the clinical care team has offered in the emergency department and prior inpatient clinical care team has offered to mitigate risk of self-harm, other than allowing [place] to observe the patient to sobriety or behavior.     Further writer sees no evidence of severe psychosis, cognitive impairment, outside of intoxication, or other condition that prevents the patient from acting under their own choice and volition.    Recommendations:  Psych cleared   Disposition: Discharge when medically cleared.  HPI: Per Dr. Sidney Ace, Danny Booty P  Turner is a 30 y.o. male with  past medical history of substance-induced psychosis and amphetamine use who presents with agitation altered mental status and concern for infections on his face.  Patient was seen in triage by provider who notes that the patient complained of sores on his face and concern for abscesses.  He was notably significantly agitated and was given droperidol and Versed prior to my evaluation.  The time my evaluation patient is sedated and I am unable to obtain additional history.     Past Psychiatric History:   Risk to Self:   Risk to Others:   Prior Inpatient Therapy:   Prior Outpatient Therapy:    Past Medical History: History reviewed. No pertinent past medical history.  Past Surgical History:  Procedure Laterality Date   APPENDECTOMY     LEG SURGERY Left    Family History: History reviewed. No pertinent family history. Family Psychiatric  History:  Social History:  Social History   Substance and Sexual Activity  Alcohol Use Not Currently   Comment: daily use     Social History   Substance and Sexual Activity  Drug Use Yes   Types: Methamphetamines    Social History   Socioeconomic History   Marital status: Single    Spouse name: Not on file   Number of children: Not on file   Years of education: Not on file   Highest education level: Not on file  Occupational History   Not on file  Tobacco Use   Smoking status: Every Day    Packs/day: 1.00    Types: Cigarettes   Smokeless tobacco: Never  Substance and Sexual Activity   Alcohol use: Not Currently    Comment: daily use   Drug use: Yes    Types: Methamphetamines   Sexual activity: Not on file  Other Topics Concern   Not on file  Social History Narrative   Not on file   Social Determinants of Health   Financial Resource Strain: Not on file  Food Insecurity: Not on file  Transportation Needs: Not on file  Physical Activity: Not on file  Stress: Not on file  Social Connections: Not on file   Additional Social  History:    Allergies:   Allergies  Allergen Reactions   Penicillins     Labs:  Results for orders placed or performed during the hospital encounter of 10/13/21 (from the past 48 hour(s))  CBC with Differential/Platelet     Status: Abnormal   Collection Time: 10/13/21 12:14 AM  Result Value Ref Range   WBC 12.0 (H) 4.0 - 10.5 K/uL   RBC 4.57 4.22 - 5.81 MIL/uL   Hemoglobin 13.7 13.0 - 17.0 g/dL   HCT 07.3 71.0 - 62.6 %   MCV 85.6 80.0 - 100.0 fL   MCH 30.0 26.0 - 34.0 pg   MCHC 35.0 30.0 - 36.0 g/dL   RDW 94.8 54.6 - 27.0 %   Platelets 255 150 - 400 K/uL   nRBC 0.0 0.0 - 0.2 %   Neutrophils Relative % 66 %   Neutro Abs 7.9 (H) 1.7 - 7.7 K/uL   Lymphocytes Relative 24 %   Lymphs Abs 2.8 0.7 - 4.0 K/uL   Monocytes Relative 9 %   Monocytes Absolute 1.1 (H) 0.1 - 1.0 K/uL   Eosinophils Relative 1 %   Eosinophils Absolute 0.2 0.0 - 0.5 K/uL   Basophils Relative 0 %   Basophils Absolute 0.1 0.0 - 0.1 K/uL   Immature  Granulocytes 0 %   Abs Immature Granulocytes 0.03 0.00 - 0.07 K/uL    Comment: Performed at [facility], [address] [place], [place] [address]  Comprehensive metabolic panel     Status: Abnormal   Collection Time: 10/13/21 12:14 AM  Result Value Ref Range   Sodium 141 135 - 145 mmol/L   Potassium 3.5 3.5 - 5.1 mmol/L   Chloride 106 98 - 111 mmol/L   CO2 24 22 - 32 mmol/L   Glucose, Bld 96 70 - 99 mg/dL    Comment: Glucose reference range applies only to samples taken after fasting for at least 8 hours.   BUN 29 (H) 6 - 20 mg/dL   Creatinine, Ser 6.29 0.61 - 1.24 mg/dL   Calcium 9.7 8.9 - 47.6 mg/dL   Total Protein 8.0 6.5 - 8.1 g/dL   Albumin 4.7 3.5 - 5.0 g/dL   AST 48 (H) 15 - 41 U/L   ALT 82 (H) 0 - 44 U/L   Alkaline Phosphatase 36 (L) 38 - 126 U/L   Total Bilirubin 2.5 (H) 0.3 - 1.2 mg/dL   GFR, Estimated >54 >65 mL/min    Comment: (NOTE) Calculated using the CKD-EPI Creatinine Equation (2021)    Anion gap 11 5 - 15    Comment:  Performed at [facility], [address] [place], [place] [address]  CK     Status: None   Collection Time: 10/13/21 12:14 AM  Result Value Ref Range   Total CK 354 49 - 397 U/L    Comment: Performed at [facility], [address] [place], [place] [address]  HIV Antibody (routine testing w rflx)     Status: None   Collection Time: 10/13/21 12:14 AM  Result Value Ref Range   HIV Screen 4th Generation wRfx Non Reactive Non Reactive    Comment: Performed at [facility], [address] [place], [place] [address]  Acetaminophen level     Status: Abnormal   Collection Time: 10/13/21 12:14 AM  Result Value Ref Range   Acetaminophen (Tylenol), Serum <10 (L) 10 - 30 ug/mL    Comment: (NOTE) Therapeutic concentrations vary significantly. A range of 10-30 ug/mL  may be an effective concentration for many patients. However, some  are best treated at concentrations outside of this range. Acetaminophen concentrations >150 ug/mL at 4 hours after ingestion  and >50 ug/mL at 12 hours after ingestion are often associated with  toxic reactions.  Performed at [facility], [address] [place], [place] [address]   Salicylate level     Status: None   Collection Time: 10/13/21 12:14 AM  Result Value Ref Range   Salicylate Lvl 10.4 7.0 - 30.0 mg/dL    Comment: Performed at [facility], [address] [place], [place] [address]  Ethanol     Status: None   Collection Time: 10/13/21 12:14 AM  Result Value Ref Range   Alcohol, Ethyl (B) <10 <10 mg/dL    Comment: (NOTE) Lowest detectable limit for serum alcohol is 10 mg/dL.  For medical purposes only. Performed at [facility], [address] [place], [place] [address]   Blood culture (routine x 2)     Status: None (Preliminary result)   Collection Time: 10/13/21 12:44 AM   Specimen: BLOOD  Result Value Ref Range   Specimen Description BLOOD RIGHT ANTECUBITAL    Special Requests       BOTTLES DRAWN AEROBIC AND ANAEROBIC  Blood Culture adequate volume   Culture      NO GROWTH < 12 HOURS Performed at [facility], [address] [place], [place] [address]    Report Status PENDING   Blood culture (routine x 2)     Status: None (Preliminary result)   Collection Time: 10/13/21 12:44 AM   Specimen: BLOOD  Result Value Ref Range   Specimen Description BLOOD BLOOD RIGHT FOREARM    Special Requests      BOTTLES DRAWN AEROBIC AND ANAEROBIC Blood Culture results may not be optimal due to an inadequate volume of blood received in culture bottles   Culture      NO GROWTH < 12 HOURS Performed at [facility], [address] [place], [place] [address]    Report Status PENDING     No current facility-administered medications for this encounter.   Current Outpatient Medications  Medication Sig Dispense Refill   feeding supplement (ENSURE ENLIVE / ENSURE PLUS) LIQD Take 237 mLs by mouth 2 (two) times daily between meals. 237 mL 12   folic acid (FOLVITE) 1 MG tablet Take 1 tablet (1 mg total) by mouth daily. (Patient not taking: Reported on 10/13/2021) 30 tablet 11   thiamine 100 MG tablet Take 1 tablet (100 mg total) by mouth daily. (Patient not taking: Reported on 10/13/2021) 30 tablet 0    Musculoskeletal: Strength & Muscle Tone: within normal limits Gait & Station: normal Patient leans: N/A  Psychiatric Specialty Exam:  Presentation  General Appearance: Disheveled  Eye Contact:Minimal  Speech:Clear and Coherent  Speech Volume:Normal  Handedness:Right   Mood and Affect  Mood:Euthymic  Affect:Congruent; Appropriate   Thought Process  Thought Processes:Coherent; Goal Directed  Descriptions of Associations:Intact  Orientation:Full (Time, Place and Person)  Thought Content:Logical  History of Schizophrenia/Schizoaffective disorder:No data recorded Duration of Psychotic Symptoms:No data recorded Hallucinations:Hallucinations:  None  Ideas of Reference:None  Suicidal Thoughts:Suicidal Thoughts: No  Homicidal Thoughts:Homicidal Thoughts: No   Sensorium  Memory:Immediate Good; Recent Good; Remote Good  Judgment:Fair  Insight:Fair   Executive Functions  Concentration:Fair  Attention Span:Fair  Recall:Fair  Fund of Knowledge:Fair  Language:Fair   Psychomotor Activity  Psychomotor Activity:Psychomotor Activity: Normal   Assets  Assets:Resilience; Desire for Improvement; Financial Resources/Insurance; Housing   Sleep  Sleep:Sleep: Good   Physical Exam: Physical Exam Vitals and nursing note reviewed.  Constitutional:      Appearance: Normal appearance.  HENT:     Head: Normocephalic and atraumatic.     Nose: Nose normal.  Cardiovascular:     Rate and Rhythm: Normal rate.     Pulses: Normal pulses.  Pulmonary:     Effort: Pulmonary effort is normal.  Musculoskeletal:        General: Normal range of motion.     Cervical back: Normal range of motion and neck supple.  Neurological:     General: No focal deficit present.     Mental Status: He is alert and oriented to person, place, and time.  Psychiatric:        Attention and Perception: Attention and perception normal.        Mood and Affect: Affect normal.        Speech: Speech normal.        Behavior: Behavior is uncooperative.        Thought Content: Thought content normal.        Cognition and Memory: Cognition and memory normal.        Judgment: Judgment normal.  Review of Systems  Psychiatric/Behavioral:  Positive for substance abuse and suicidal ideas.   All other systems reviewed and are negative. Blood pressure 121/69, pulse 74, temperature 97.9 F (36.6 C), temperature source Oral, resp. rate 15, height 5\' 7"  (1.702 m), weight 69.8 kg, SpO2 97 %. Body mass index is 24.1 kg/m.  Treatment Plan Summary: Plan The patient is not a safety risk to himself or others and does not require psychiatric inpatient admission for  stabilization and treatment.  Disposition: No evidence of imminent risk to self or others at present.   Patient does not meet criteria for psychiatric inpatient admission. Supportive therapy provided about ongoing stressors.  , NP 10/13/2021 10:48 PM

## 2021-10-13 NOTE — Progress Notes (Signed)
   10/13/21 1510  Clinical Encounter Type  Visited With Patient  Visit Type Initial;Other (Comment) (introduction)  Referral From Other (Comment) (rounding)  Spiritual Encounters  Spiritual Needs Other (Comment) (not assessed)  Chaplain Burris spoke briefly with Pt in hallway. Chaplain introduced herself and made offer of support, pastoral care. Pt was pleasant and appropriate but did not wish to engage at this time. Chaplain on-call available as needed.

## 2021-10-13 NOTE — ED Provider Notes (Signed)
Emergency Medicine Provider Triage Evaluation Note  Danny Turner , a 30 y.o. male  was evaluated in triage.  Pt complains of multiple wounds to his face and hands.  Unable to obtain history due to his extreme agitation and labile emotions.  History of polysubstance abuse.  Review of Systems  Positive: Substance abuse, agitation, multiple wounds to his hands and face Negative: Chest pain and shortness of breath  Physical Exam  Ht 1.702 m (5\' 7" )   Wt 69.8 kg   BMI 24.10 kg/m  Gen:   Awake, highly agitated Resp:  Normal effort but rapid rate MSK:   Moves extremities without difficulty , ambulatory without difficulty Other:  Multiple excoriated lesions on his face and hands consistent with methamphetamine use.  Small areas of erythema but no obvious large areas of cellulitis or abscesses.  Medical Decision Making  Medically screening exam initiated at 12:26 AM.  Appropriate orders placed.  Danny Turner was informed that the remainder of the evaluation will be completed by another provider, this initial triage assessment does not replace that evaluation, and the importance of remaining in the ED until their evaluation is complete.  Patient currently wants help but also lacks decision-making capacity due to what appears to be methamphetamine abuse.  He is willing to seek treatment.  Patient is being sent back to the main side of the ED ASAP and I ordered basic labs that also include blood cultures and HIV testing because the patient is perseverating about whether or not he has HIV (history of IV drug use).  As calming agents I ordered droperidol 5 mg IM and Versed 2 mg IM.  I first verified on his old EKG that he does not have a prolonged QTc interval.   Danny Junes, MD 10/13/21 0031

## 2021-10-13 NOTE — ED Provider Notes (Signed)
[facility]  ____________________________________________   Event Date/Time   First MD Initiated Contact with Patient 10/13/21 0028     (approximate)  I have reviewed the triage vital signs and the nursing notes.   HISTORY  Chief Complaint Abscess    HPI BASHAR MILAM is a 30 y.o. male with past medical history of substance-induced psychosis and amphetamine use who presents with agitation altered mental status and concern for infections on his face.  Patient was seen in triage by provider who notes that the patient complained of sores on his face and concern for abscesses.  He was notably significantly agitated and was given droperidol and Versed prior to my evaluation.  The time my evaluation patient is sedated and I am unable to obtain additional history.         History reviewed. No pertinent past medical history.  Patient Active Problem List   Diagnosis Date Noted   Protein-calorie malnutrition, severe 08/04/2021   Hanging, undetermined whether accidentally or purposely inflicted 08/01/2021   Acute encephalopathy    Acute hypoxemic respiratory failure (HCC)    Hypokalemia    Transaminitis    Psychoactive substance-induced psychosis (HCC) 06/30/2021   Amphetamine abuse (HCC) 06/30/2021    Past Surgical History:  Procedure Laterality Date   APPENDECTOMY     LEG SURGERY Left     Prior to Admission medications   Medication Sig Start Date End Date Taking? Authorizing Provider  feeding supplement (ENSURE ENLIVE / ENSURE PLUS) LIQD Take 237 mLs by mouth 2 (two) times daily between meals. 08/06/21   Swayze, [name], DO  folic acid (FOLVITE) 1 MG tablet Take 1 tablet (1 mg total) by mouth daily. 08/06/21 08/06/22  Swayze, [name], DO  thiamine 100 MG tablet Take 1 tablet (100 mg total) by mouth daily. 08/07/21   Swayze, [name], DO    Allergies Penicillins  History reviewed. No pertinent family history.  Social History Social History   Tobacco Use    Smoking status: Every Day    Packs/day: 1.00    Types: Cigarettes   Smokeless tobacco: Never  Substance Use Topics   Alcohol use: Not Currently    Comment: daily use   Drug use: Yes    Types: Methamphetamines    Review of Systems   Review of Systems  Unable to perform ROS: Mental status change   Physical Exam Updated Vital Signs BP (!) 94/50 (BP Location: Right Arm)   Pulse 91   Resp 14   Ht 5\' 7"  (1.702 m)   Wt 69.8 kg   SpO2 100%   BMI 24.10 kg/m   Physical Exam Vitals and nursing note reviewed.  Constitutional:      General: He is not in acute distress.    Appearance: Normal appearance.     Comments: Patient is sleeping comfortably  HENT:     Head: Normocephalic and atraumatic.  Eyes:     General: No scleral icterus.    Conjunctiva/sclera: Conjunctivae normal.  Cardiovascular:     Rate and Rhythm: Normal rate and regular rhythm.  Pulmonary:     Effort: Pulmonary effort is normal. No respiratory distress.     Breath sounds: Normal breath sounds. No wheezing.  Abdominal:     General: Abdomen is flat. There is no distension.     Tenderness: There is no abdominal tenderness. There is no guarding.  Musculoskeletal:        General: No deformity or signs of injury.  Cervical back: Normal range of motion.  Skin:    Coloration: Skin is not jaundiced or pale.     Comments: Several superficial sores on the face, mild crusting, no significant cellulitis  No open wounds on the bilateral upper and lower extremities noted  Neurological:     Comments: Patient is protecting his airway, sedated sleeping comfortably, does not respond to voice or light physical stimulus  Psychiatric:     Comments: Unable to assess while sedated     LABS (all labs ordered are listed, but only abnormal results are displayed)  Labs Reviewed  CBC WITH DIFFERENTIAL/PLATELET - Abnormal; Notable for the following components:      Result Value   WBC 12.0 (*)    Neutro Abs 7.9 (*)     Monocytes Absolute 1.1 (*)    All other components within normal limits  COMPREHENSIVE METABOLIC PANEL - Abnormal; Notable for the following components:   BUN 29 (*)    AST 48 (*)    ALT 82 (*)    Alkaline Phosphatase 36 (*)    Total Bilirubin 2.5 (*)    All other components within normal limits  ACETAMINOPHEN LEVEL - Abnormal; Notable for the following components:   Acetaminophen (Tylenol), Serum <10 (*)    All other components within normal limits  CULTURE, BLOOD (ROUTINE X 2)  CULTURE, BLOOD (ROUTINE X 2)  CK  SALICYLATE LEVEL  ETHANOL  HIV ANTIBODY (ROUTINE TESTING W REFLEX)  URINE DRUG SCREEN, QUALITATIVE ([facility] ONLY)   ____________________________________________  EKG  N/a ____________________________________________  RADIOLOGY Ky Barban, personally viewed and evaluated these images (plain radiographs) as part of my medical decision making, as well as reviewing the written report by the radiologist.  ED MD interpretation:  n/a    ____________________________________________   PROCEDURES  Procedure(s) performed (including Critical Care):  Procedures   ____________________________________________   INITIAL IMPRESSION / ASSESSMENT AND PLAN / ED COURSE     31 year old male presenting with concern for abscesses notably significantly agitated in triage requiring sedation.  Vital signs notable for some mild tachycardia.  Initially he was quite agitated and received droperidol and Versed prior to my evaluation.  By the time I saw the patient he was significantly sedated.  Does have some superficial sores on his face but no concern for abscess or cellulitis.  Will continue to monitor.  Suspect substance induced agitation.  Will reassess when he is less sedated any need for ongoing psychiatric or medical evaluation.   Patient's labs are overall reassuring.  He has not yet urinated.  On reassessment he is much more awake and calm.  He does complain of being  depressed and "would like to get his mind right."  He denies suicidal homicidal ideation.  We will consult psychiatry.  No indication for IVC at this time.  He is pending psychiatry eval at time of signout.   ____________________________________________   FINAL CLINICAL IMPRESSION(S) / ED DIAGNOSES  Final diagnoses:  Depression, unspecified depression type  Agitation     ED Discharge Orders     None        Note:  This document was prepared using Dragon voice recognition software and may include unintentional dictation errors.    Georga Hacking, MD 10/13/21 [phone]

## 2021-10-13 NOTE — ED Triage Notes (Signed)
Pt to ED via POV states sores on either side of his face x 2 days, states feels like they are abscesses, pt also feels like has bumps on his hands that are bleeding. Pt notably anxious, unable to stop moving at triage desk.

## 2021-10-13 NOTE — ED Notes (Signed)
Pt in triage room 1 with writer and MD. Pt unable to sit still and unable to stop talking. Pt sts he has to make a phone call right now to his probation officer due to ankle bracelet. Writer called due to no service on pts phone. Left message with pts information and current status in hospital. Pt continuously touching and moving. Pt refusing to answering questions asked. Pt sts, "Come on man, just type and dont ask questions. 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 Just give me medicine."

## 2021-10-13 NOTE — ED Notes (Signed)
Upon trying to discharge this pt , he began to manipulate this nurse and the charge nurse into staying longer for psychiatric help. Pt had been explained to that he has been medically and psychiatrically cleared by the physicians here at [facility]. The pt began to state "nah fuck this I wanted to get better" pt was reassured that he had been cleared and was told that getting better mentally starts with the [facility] out patient follow up that he was referred to. Pt did not want that and continued to argue with both nurses stating we did nothing to help him. Pt discharged upon review by Dr. Derrill Kay .

## 2021-10-13 NOTE — Consult Note (Signed)
[facility] Face-to-Face Psychiatry Consult   Reason for Consult:  Psych consult for behavior Referring Physician:  EDP Patient Identification: PACER DORN MRN:  [id number] Principal Diagnosis: <principal problem not specified> Diagnosis:  Active Problems:   * No active hospital problems. *   Total Time spent with patient:  5 min  Subjective:   Danny Turner is a 30 y.o. male patient admitted with "I think I have HIV".  HPI:  Chart reviewed. Had presented to ED with concerns of "HIV coming out of his sores." He was given droperidol and midazolam at 0037 this morning due to his agitation/continuous movement. Attempted to interview patient x 2 this morning, about 0930 and again at 1145; he is unable to stay awake. He is refusing to give urine sample. Mumbles. He is wearing ankle bracelet, states he "got out of jail a few days ago." Will attempt evaluation later.   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 Medically admitted to [facility] for apparent intentional hanging while in jail  08/01/21 -08/03/21.  Risk to Self:   Risk to Others:   Prior Inpatient Therapy:   Prior Outpatient Therapy:    Past Medical History: History reviewed. No pertinent past medical history.  Past Surgical History:  Procedure Laterality Date   APPENDECTOMY     LEG SURGERY Left    Family History: History reviewed. No pertinent family history. Family Psychiatric  History: unknown Social History:  Social History   Substance and Sexual Activity  Alcohol Use Not Currently   Comment: daily use     Social History   Substance and Sexual Activity  Drug Use Yes   Types: Methamphetamines    Social History   Socioeconomic History   Marital status: Single    Spouse name: Not on file   Number of children: Not on file   Years of education: Not on file   Highest education level: Not on file  Occupational History   Not on file   Tobacco Use   Smoking status: Every Day    Packs/day: 1.00    Types: Cigarettes   Smokeless tobacco: Never  Substance and Sexual Activity   Alcohol use: Not Currently    Comment: daily use   Drug use: Yes    Types: Methamphetamines   Sexual activity: Not on file  Other Topics Concern   Not on file  Social History Narrative   Not on file   Social Determinants of Health   Financial Resource Strain: Not on file  Food Insecurity: Not on file  Transportation Needs: Not on file  Physical Activity: Not on file  Stress: Not on file  Social Connections: Not on file   Additional Social History:    Allergies:   Allergies  Allergen Reactions   Penicillins     Labs:  Results for orders placed or performed during the hospital encounter of 10/13/21 (from the past 48 hour(s))  CBC with Differential/Platelet     Status: Abnormal   Collection Time: 10/13/21 12:14 AM  Result Value Ref Range   WBC 12.0 (H) 4.0 - 10.5 K/uL   RBC 4.57 4.22 - 5.81 MIL/uL   Hemoglobin 13.7 13.0 - 17.0 g/dL   HCT 16.9 45.0 - 38.8 %   MCV 85.6 80.0 - 100.0 fL   MCH 30.0 26.0 - 34.0 pg   MCHC 35.0 30.0 - 36.0 g/dL   RDW 82.8 00.3 - 49.1 %   Platelets 255 150 -  400 K/uL   nRBC 0.0 0.0 - 0.2 %   Neutrophils Relative % 66 %   Neutro Abs 7.9 (H) 1.7 - 7.7 K/uL   Lymphocytes Relative 24 %   Lymphs Abs 2.8 0.7 - 4.0 K/uL   Monocytes Relative 9 %   Monocytes Absolute 1.1 (H) 0.1 - 1.0 K/uL   Eosinophils Relative 1 %   Eosinophils Absolute 0.2 0.0 - 0.5 K/uL   Basophils Relative 0 %   Basophils Absolute 0.1 0.0 - 0.1 K/uL   Immature Granulocytes 0 %   Abs Immature Granulocytes 0.03 0.00 - 0.07 K/uL    Comment: Performed at [facility], [address] [place], [place] [address]  Comprehensive metabolic panel     Status: Abnormal   Collection Time: 10/13/21 12:14 AM  Result Value Ref Range   Sodium 141 135 - 145 mmol/L   Potassium 3.5 3.5 - 5.1 mmol/L   Chloride 106 98 - 111 mmol/L   CO2  24 22 - 32 mmol/L   Glucose, Bld 96 70 - 99 mg/dL    Comment: Glucose reference range applies only to samples taken after fasting for at least 8 hours.   BUN 29 (H) 6 - 20 mg/dL   Creatinine, Ser 7.79 0.61 - 1.24 mg/dL   Calcium 9.7 8.9 - 39.0 mg/dL   Total Protein 8.0 6.5 - 8.1 g/dL   Albumin 4.7 3.5 - 5.0 g/dL   AST 48 (H) 15 - 41 U/L   ALT 82 (H) 0 - 44 U/L   Alkaline Phosphatase 36 (L) 38 - 126 U/L   Total Bilirubin 2.5 (H) 0.3 - 1.2 mg/dL   GFR, Estimated >30 >09 mL/min    Comment: (NOTE) Calculated using the CKD-EPI Creatinine Equation (2021)    Anion gap 11 5 - 15    Comment: Performed at [facility], [address] [place], [place] [address]  CK     Status: None   Collection Time: 10/13/21 12:14 AM  Result Value Ref Range   Total CK 354 49 - 397 U/L    Comment: Performed at [facility], [address] [place], [place] [address]  HIV Antibody (routine testing w rflx)     Status: None   Collection Time: 10/13/21 12:14 AM  Result Value Ref Range   HIV Screen 4th Generation wRfx Non Reactive Non Reactive    Comment: Performed at [facility], [address] [place], [place] [address]  Acetaminophen level     Status: Abnormal   Collection Time: 10/13/21 12:14 AM  Result Value Ref Range   Acetaminophen (Tylenol), Serum <10 (L) 10 - 30 ug/mL    Comment: (NOTE) Therapeutic concentrations vary significantly. A range of 10-30 ug/mL  may be an effective concentration for many patients. However, some  are best treated at concentrations outside of this range. Acetaminophen concentrations >150 ug/mL at 4 hours after ingestion  and >50 ug/mL at 12 hours after ingestion are often associated with  toxic reactions.  Performed at [facility], [address] [place], [place] [address]   Salicylate level     Status: None   Collection Time: 10/13/21 12:14 AM  Result Value Ref Range   Salicylate Lvl 10.4 7.0 - 30.0 mg/dL    Comment: Performed  at [facility], [address] [place], [place] [address]  Ethanol     Status: None   Collection Time: 10/13/21 12:14 AM  Result Value Ref Range  Alcohol, Ethyl (B) <10 <10 mg/dL    Comment: (NOTE) Lowest detectable limit for serum alcohol is 10 mg/dL.  For medical purposes only. Performed at [facility], [address] [place], [place] [address]   Blood culture (routine x 2)     Status: None (Preliminary result)   Collection Time: 10/13/21 12:44 AM   Specimen: BLOOD  Result Value Ref Range   Specimen Description BLOOD RIGHT ANTECUBITAL    Special Requests      BOTTLES DRAWN AEROBIC AND ANAEROBIC Blood Culture adequate volume   Culture      NO GROWTH < 12 HOURS Performed at [facility], [address], [place], [place] [address]    Report Status PENDING   Blood culture (routine x 2)     Status: None (Preliminary result)   Collection Time: 10/13/21 12:44 AM   Specimen: BLOOD  Result Value Ref Range   Specimen Description BLOOD BLOOD RIGHT FOREARM    Special Requests      BOTTLES DRAWN AEROBIC AND ANAEROBIC Blood Culture results may not be optimal due to an inadequate volume of blood received in culture bottles   Culture      NO GROWTH < 12 HOURS Performed at [facility], [address] [place], [place] [address]    Report Status PENDING     No current facility-administered medications for this encounter.   Current Outpatient Medications  Medication Sig Dispense Refill   feeding supplement (ENSURE ENLIVE / ENSURE PLUS) LIQD Take 237 mLs by mouth 2 (two) times daily between meals. 237 mL 12   folic acid (FOLVITE) 1 MG tablet Take 1 tablet (1 mg total) by mouth daily. (Patient not taking: Reported on 10/13/2021) 30 tablet 11   thiamine 100 MG tablet Take 1 tablet (100 mg total) by mouth daily. (Patient not taking: Reported on 10/13/2021) 30 tablet 0    Musculoskeletal: Strength & Muscle Tone: within normal limits Gait &  Station: normal Patient leans: N/A            Psychiatric Specialty Exam:  Presentation  General Appearance:  Eye Contact:  Speech: Speech Volume:  Handedness:  Mood and Affect  Mood:  Affect:   Thought Process  Thought Processes:  Descriptions of Associations:  Orientation:  Thought Content: History of Schizophrenia/Schizoaffective disorder: Duration of Psychotic Symptoms:No data recorded Hallucinations:No data recorded Ideas of Reference:None  Suicidal Thoughts:No data recorded Homicidal Thoughts:No data recorded  Sensorium  Memory:  Judgment:  Insight:   Executive Functions  Concentration:  Attention Span: Recall:  Fund of Knowledge:  Language:   Psychomotor Activity  Psychomotor Activity:No data recorded  Assets  Assets:   Sleep  Sleep:No data recorded  Physical Exam: Physical Exam Vitals and nursing note reviewed.  Skin:    Findings: Lesion present.     Comments: Lesions/scabbed, not currently draining. Right side of face near chin   ROS Blood pressure 121/69, pulse 74, temperature 97.9 F (36.6 C), temperature source Oral, resp. rate 15, height 5\' 7"  (1.702 m), weight 69.8 kg, SpO2 97 %. Body mass index is 24.1 kg/m.  Treatment Plan Summary: Plan Needs full assessment. Unable to assess due to somnolence   Disposition:  Needs Assessment  , NP 10/13/2021 7:37 PM

## 2021-10-13 NOTE — Discharge Instructions (Signed)
Please seek medical attention for any high fevers, chest pain, shortness of breath, change in behavior, persistent vomiting, bloody stool or any other new or concerning symptoms.  

## 2021-10-13 NOTE — ED Notes (Addendum)
Back to stretcher 19H. Unable to sit still. Mumbling repetitively. Unable to redirect for any length of time. Cooperative but very agitated.

## 2021-10-13 NOTE — ED Notes (Signed)
Pt given 2 cups of water a bag of chips and 2 cookies

## 2021-10-18 ENCOUNTER — Emergency Department: Admission: EM | Admit: 2021-10-18 | Discharge: 2021-10-18 | Discharge status: Against Medical Advice | Payer: Self-pay

## 2021-10-18 LAB — CULTURE, BLOOD (ROUTINE X 2)
Culture: NO GROWTH
Culture: NO GROWTH
Special Requests: ADEQUATE

## 2021-10-18 NOTE — ED Triage Notes (Signed)
Pt called x's 3, no response ?

## 2021-10-18 NOTE — ED Notes (Signed)
Called x2

## 2021-10-18 NOTE — ED Triage Notes (Signed)
Pt called x's 2, no response 

## 2021-10-18 NOTE — ED Triage Notes (Signed)
Pt called for triage, no response. 

## 2022-03-09 ENCOUNTER — Other Ambulatory Visit: Payer: Self-pay

## 2022-03-09 ENCOUNTER — Emergency Department (HOSPITAL_COMMUNITY)
Admission: EM | Admit: 2022-03-09 | Discharge: 2022-03-09 | Discharge status: Against Medical Advice | Payer: Self-pay | Attending: Emergency Medicine | Admitting: Emergency Medicine

## 2022-03-09 ENCOUNTER — Encounter (HOSPITAL_COMMUNITY): Payer: Self-pay | Admitting: Emergency Medicine

## 2022-03-09 DIAGNOSIS — B999 Unspecified infectious disease: Secondary | ICD-10-CM | POA: Insufficient documentation

## 2022-03-09 DIAGNOSIS — Z5321 Procedure and treatment not carried out due to patient leaving prior to being seen by health care provider: Secondary | ICD-10-CM | POA: Insufficient documentation

## 2022-03-09 NOTE — ED Triage Notes (Signed)
Pt brought to ED by [facility] for evaluation of nasal infection. Pt endorses methamphetamine usage yesterday.  ?

## 2022-03-09 NOTE — ED Notes (Signed)
Called pt to come back to room to be seen by the dr, he stated "he was leaving because he had been waiting too long as it is and he's already called his ride." I did ask him if he was sure and he stated he was. Moving pt OTF. ?

## 2022-03-09 NOTE — ED Notes (Signed)
Patient noted to be in bathroom since 4am. When asked to open the door patient states he has parasites and pus coming out his nose and if we weren't going to put him in a f*ing room then he would stay in the bathroom and pick the pus out because he was dying. At this time, patient still in bathroom with door completely shut. ?

## 2022-03-09 NOTE — ED Provider Triage Note (Addendum)
Emergency Medicine Provider Triage Evaluation Note ? ?Danny Turner , a 31 y.o. male  was evaluated in triage.  Pt complains of nose infection.  Reportedly used  intranasal methamphetamines yesterday and feels like his "nose is rotting off".  He is also convinced that there is a bug in his left hand and continues to pick at the skin and there.  He states that his "nose is gone and he feels the infection going into his throat". He states he needs antibiotics now so he "doesn't lose his nose". Denies fevers. Denies IV drug use    ? ?Review of Systems  ?Positive: As above ?Negative: As above  ? ?Physical Exam  ?BP (!) 134/98   Pulse (!) 122   Temp 98.6 ?F (37 ?C) (Oral)   Resp 20   SpO2 100%  ?Gen:   Awake, no distress   ?Resp:  Normal effort  ?MSK:   Moves extremities without difficulty  ?Other:      Tip of nose erythematous, no fluctuance. No palpable abscess. Left hand with area of broken skin, no evidence of insect or foreign objects  ? ?Medical Decision Making  ?Medically screening exam initiated at 2:58 AM.  Appropriate orders placed.  Tkai Killey Seney was informed that the remainder of the evaluation will be completed by another provider, this initial triage assessment does not replace that evaluation, and the importance of remaining in the ED until their evaluation is complete. ? ? ? ? ?  ?Garald Balding, PA-C ?03/09/22 0304 ? ?

## 2022-03-11 ENCOUNTER — Emergency Department
Admission: EM | Admit: 2022-03-11 | Discharge: 2022-03-11 | Discharge status: Against Medical Advice | Payer: Self-pay | Attending: Emergency Medicine | Admitting: Emergency Medicine

## 2022-03-11 ENCOUNTER — Other Ambulatory Visit: Payer: Self-pay

## 2022-03-11 ENCOUNTER — Emergency Department: Payer: Self-pay

## 2022-03-11 DIAGNOSIS — L03116 Cellulitis of left lower limb: Secondary | ICD-10-CM | POA: Insufficient documentation

## 2022-03-11 DIAGNOSIS — R000 Tachycardia, unspecified: Secondary | ICD-10-CM | POA: Insufficient documentation

## 2022-03-11 DIAGNOSIS — L0100 Impetigo, unspecified: Secondary | ICD-10-CM | POA: Insufficient documentation

## 2022-03-11 LAB — BASIC METABOLIC PANEL
Anion gap: 6 (ref 5–15)
BUN: 18 mg/dL (ref 6–20)
CO2: 29 mmol/L (ref 22–32)
Calcium: 8.8 mg/dL — ABNORMAL LOW (ref 8.9–10.3)
Chloride: 102 mmol/L (ref 98–111)
Creatinine, Ser: 0.79 mg/dL (ref 0.61–1.24)
GFR, Estimated: >60 mL/min (ref >60)
Glucose, Bld: 93 mg/dL (ref 70–99)
Potassium: 3.7 mmol/L (ref 3.5–5.1)
Sodium: 137 mmol/L (ref 135–145)

## 2022-03-11 LAB — LACTIC ACID, PLASMA
Lactic Acid, Venous: 2.8 mmol/L (ref 0.5–1.9)
Lactic Acid, Venous: 0.6 mmol/L (ref 0.5–1.9)

## 2022-03-11 LAB — CBC
HCT: 44.1 % (ref 39.0–52.0)
Hemoglobin: 14.1 g/dL (ref 13.0–17.0)
MCH: 28.5 pg (ref 26.0–34.0)
MCHC: 32 g/dL (ref 30.0–36.0)
MCV: 89.3 fL (ref 80.0–100.0)
Platelets: 314 10*3/uL (ref 150–400)
RBC: 4.94 MIL/uL (ref 4.22–5.81)
RDW: 12.9 % (ref 11.5–15.5)
WBC: 8.9 10*3/uL (ref 4.0–10.5)
nRBC: 0 % (ref 0.0–0.2)

## 2022-03-11 IMAGING — US US EXTREM LOW VENOUS*L*
1 series · 13 of 24 positions shown · non-contrast
Comparison: None.

CLINICAL DATA: Left lower extremity pain and edema. History of
smoking. Evaluate for DVT.



[Series 1: us venous img lower uni left (dvt) · portal-venous · 13 of 33 slices shown]
[im 1/33]
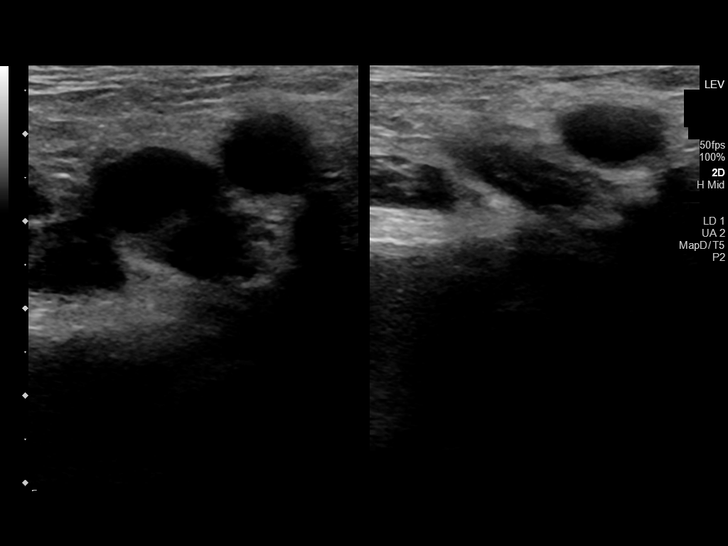
[im 3/33]
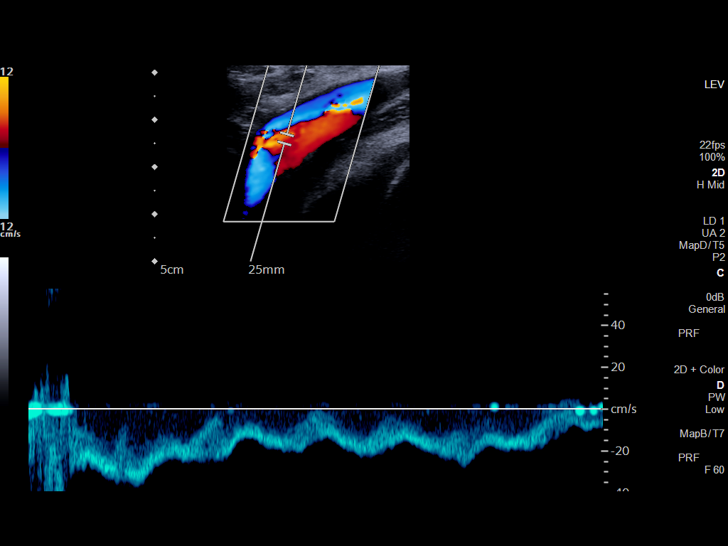
[im 6/33]
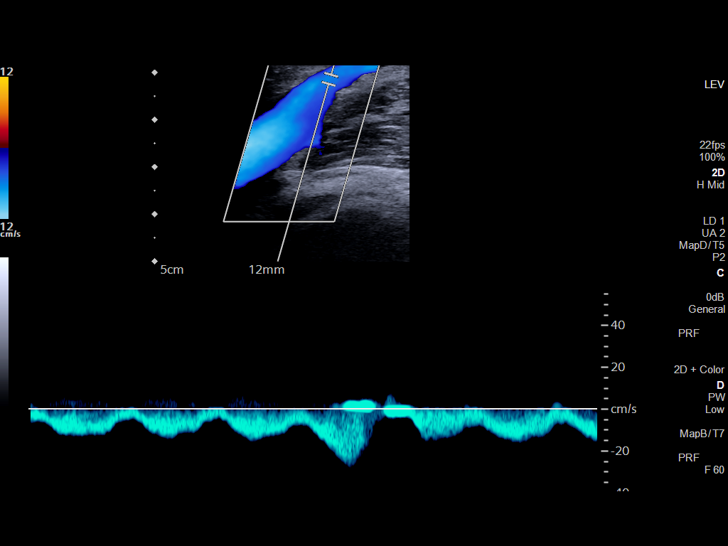
[im 9/33]
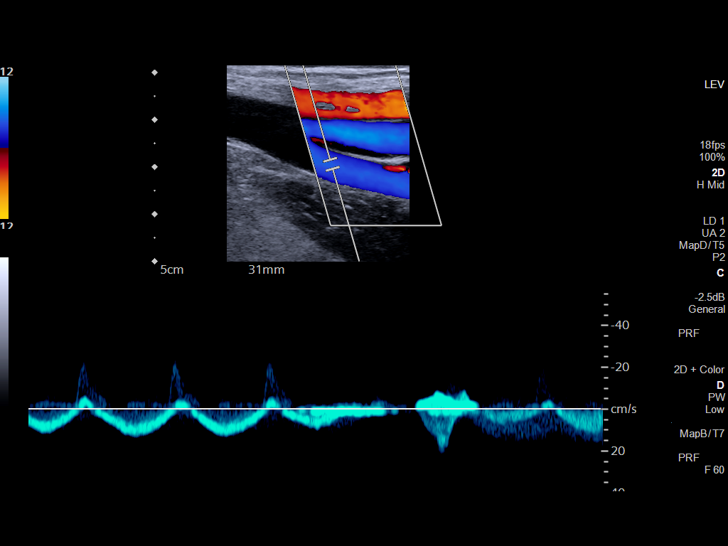
[im 12/33]
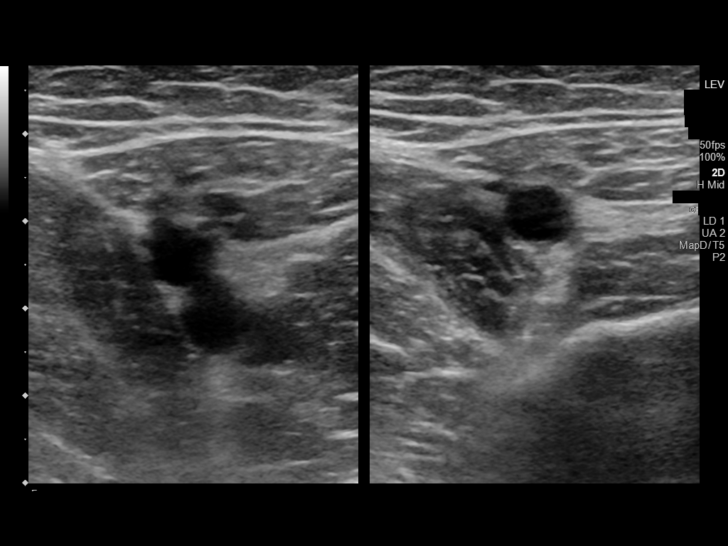
[im 14/33]
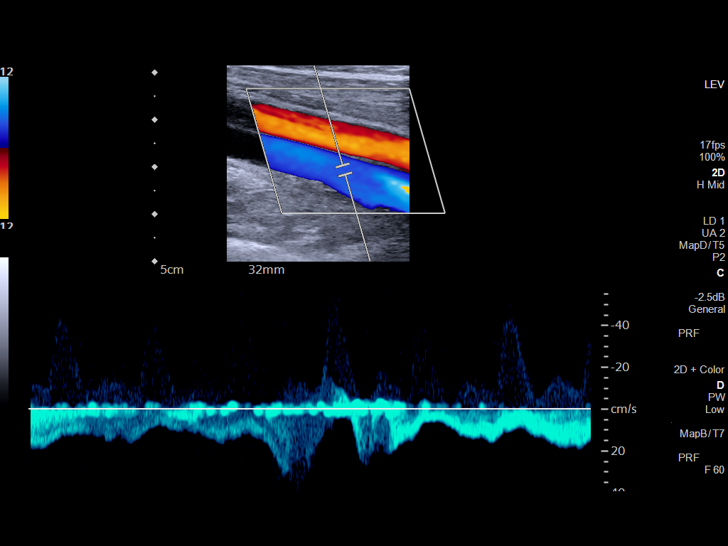
[im 17/33]
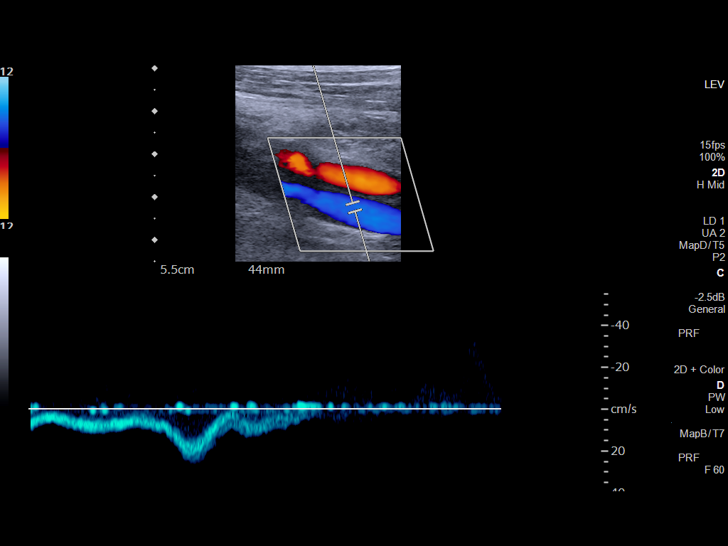
[im 19/33]
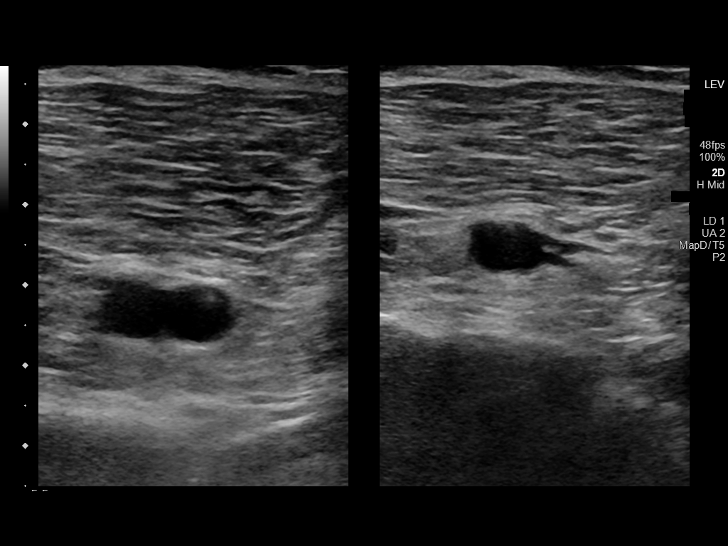
[im 21/33]
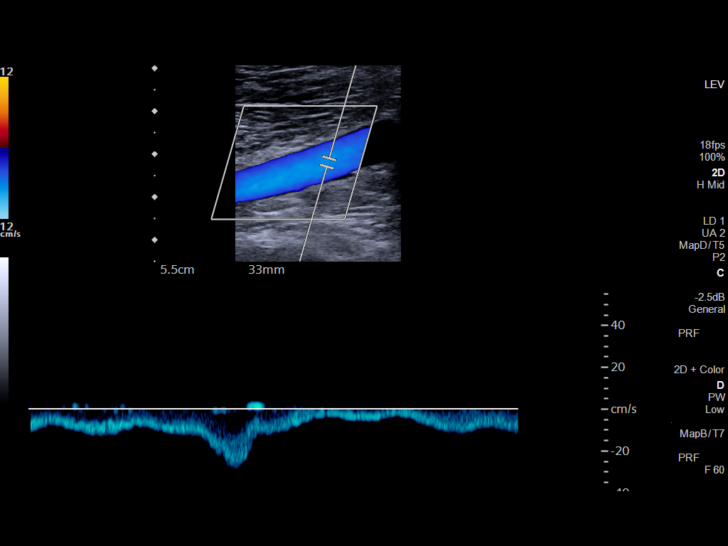
[im 24/33]
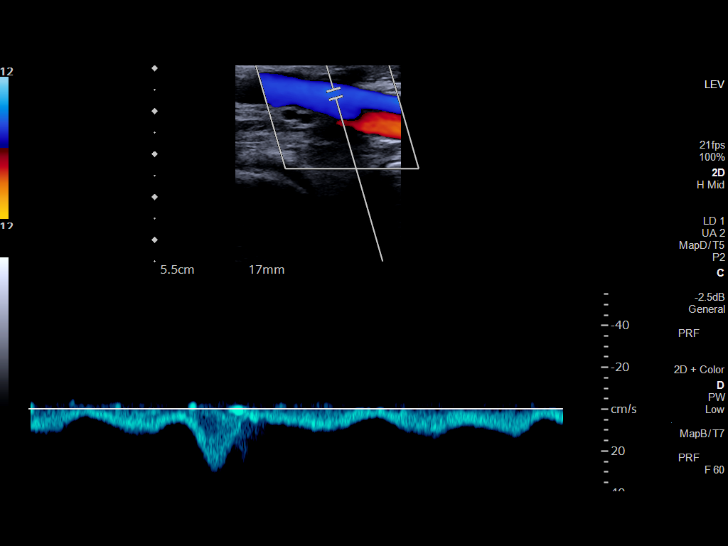
[im 27/33]
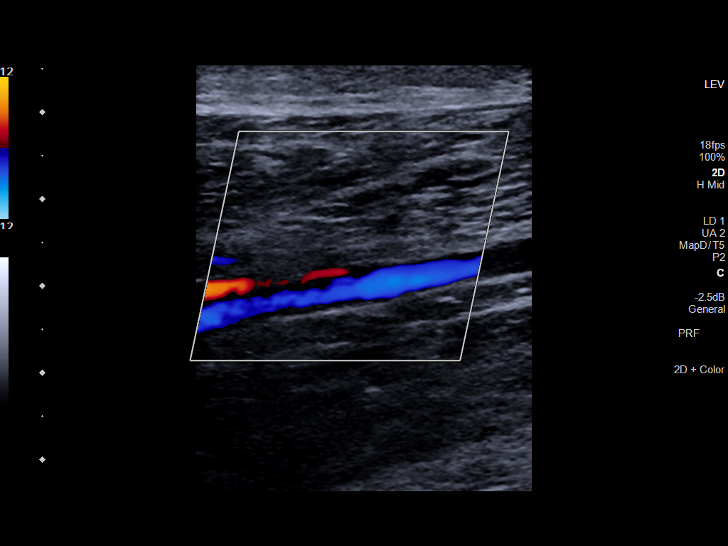
[im 30/33]
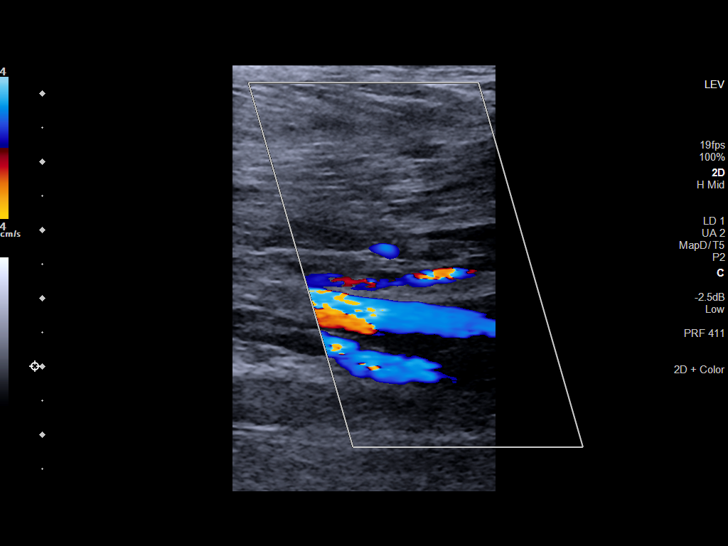
[im 33/33]
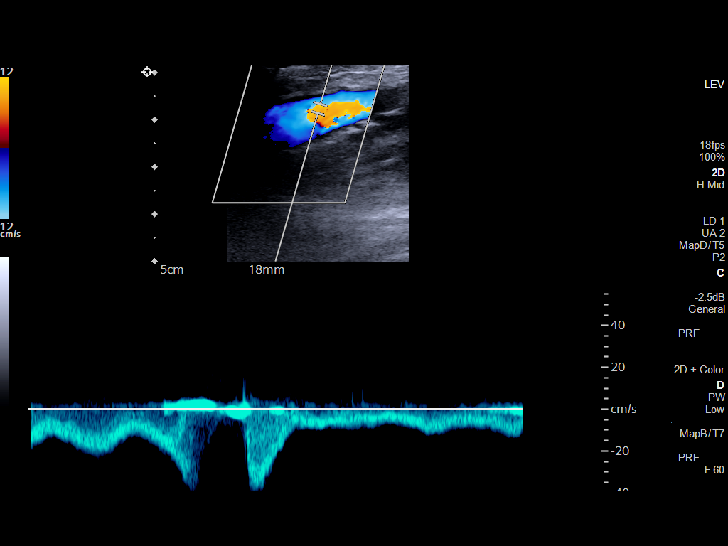

[13 of 24 positions shown; findings below may reference images not displayed]

FINDINGS: Contralateral Common Femoral Vein: Respiratory phasicity is normal
and symmetric with the symptomatic side. No evidence of thrombus.
Normal compressibility.

Common Femoral Vein: No evidence of thrombus. Normal
compressibility, respiratory phasicity and response to augmentation.

Saphenofemoral Junction: No evidence of thrombus. Normal
compressibility and flow on color Doppler imaging.

Profunda Femoral Vein: No evidence of thrombus. Normal
compressibility and flow on color Doppler imaging.

Femoral Vein: No evidence of thrombus. Normal compressibility,
respiratory phasicity and response to augmentation.

Popliteal Vein: No evidence of thrombus. Normal compressibility,
respiratory phasicity and response to augmentation.

Calf Veins: No evidence of thrombus. Normal compressibility and flow
on color Doppler imaging.

Superficial Great Saphenous Vein: No evidence of thrombus. Normal
compressibility.

Venous Reflux:  None.

Other Findings:  None.
IMPRESSION: No evidence of DVT within the left lower extremity.

## 2022-03-11 MED ORDER — MUPIROCIN 2 % EX OINT
1.0000 "application " | TOPICAL_OINTMENT | Freq: Three times a day (TID) | CUTANEOUS | 0 refills | Status: DC
  Filled 2022-03-11: qty 22, 7d supply, fill #0

## 2022-03-11 MED ORDER — MUPIROCIN 2 % EX OINT: 1.0000 "application " | TOPICAL_OINTMENT | Freq: Three times a day (TID) | CUTANEOUS | 0 refills | Status: DC

## 2022-03-11 MED ORDER — CEPHALEXIN 500 MG PO CAPS
500.0000 mg | ORAL_CAPSULE | Freq: Two times a day (BID) | ORAL | 0 refills | Status: AC
  Filled 2022-03-11: qty 14, 7d supply, fill #0

## 2022-03-11 MED ORDER — CEPHALEXIN 500 MG PO CAPS: 500.0000 mg | ORAL_CAPSULE | Freq: Two times a day (BID) | ORAL | 0 refills | Status: DC

## 2022-03-11 MED ORDER — SODIUM CHLORIDE 0.9 % IV SOLN
2.0000 g | Freq: Once | INTRAVENOUS | Status: AC
Start: 2022-03-11 — End: 2022-03-11
  Administered 2022-03-11: 2 g via INTRAVENOUS
  Filled 2022-03-11: qty 20

## 2022-03-11 MED ORDER — SODIUM CHLORIDE 0.9 % IV BOLUS
1000.0000 mL | Freq: Once | INTRAVENOUS | Status: AC
  Administered 2022-03-11: 1000 mL via INTRAVENOUS

## 2022-03-11 NOTE — ED Notes (Signed)
VOL/pending psych consult 

## 2022-03-11 NOTE — Discharge Instructions (Addendum)
Take the antibiotics to help with an infection and use the cream on your face.  Return to ER if develop fevers, worsening redness that spreading or any other concerns. ?

## 2022-03-11 NOTE — BH Assessment (Signed)
Comprehensive Clinical Assessment (CCA) Screening, Triage and Referral Note ? ?03/11/2022 ?Kota Ciancio Johann ?[id number] ? ?Cheral Marker, 31 year old male who presents to [facility] ED voluntarily for treatment. Per triage note, Pt here with a nasal and foot infx. Pt states infx is not getting any better. Pt ambulatory to tx room. Pt here for rehab tx services.  ? ?During TTS assessment pt presents alert and oriented x 4, anxious but cooperative, and mood-congruent with affect. The pt does not appear to be responding to internal or external stimuli. Neither is the pt presenting with any delusional thinking. Pt verified the information provided to triage RN.  ? ?Pt identifies his main complaint to be that he has an infection on his nose and he has auditory and visual hallucinations. Patient reports the voices are ?making him get into trouble.? ?They tell me to do things that I know are wrong but if I don?t do them, they will hurt me.? Patient states the voices are taunting him and they won?t go away. Patient reports he was just released from prison 1 week ago where he spent 2 months. Patient has a Engineer, drilling. Patient states he was living with his mom which was a bad idea. Patient reports he is homeless. Patient denies using alcohol but admits to drug use when he first got out of jail. Patient is not currently taking any psych medications but was given Zoloft while in jail due to depression. Patient states his son passed away 8 years ago which prompted the diagnosis. Pt endorsed passive SI and denies HI/AH/VH. Patient expressed having poor eating and sleeping habits.   ? ?Disposition pending Provider assessment.  ? ?Chief Complaint:  ?Chief Complaint  ?Patient presents with  ? Wound Infection  ? ?Visit Diagnosis: Hallucinations ? ?Patient Reported Information ?How did you hear about [place]? Self ? ?What Is the Reason for Your Visit/Call Today? Patient came to the ED because he has an infection on his nose and reports  having auditory hallucinations. ? ?How Long Has This Been Causing You Problems? <Week ? ?What Do You Feel Would Help You the Most Today? Medication(s) ? ? ?Have You Recently Had Any Thoughts About Hurting Yourself? Yes ? ?Are You Planning to Commit Suicide/Harm Yourself At This time? No ? ? ?Have you Recently Had Thoughts About Hurting Someone Karolee Ohs? No ? ?Are You Planning to Harm Someone at This Time? No ? ?Explanation: No data recorded ? ?Have You Used Any Alcohol or Drugs in the Past 24 Hours? No ? ?How Long Ago Did You Use Drugs or Alcohol? No data recorded ?What Did You Use and How Much? No data recorded ? ?Do You Currently Have a Therapist/Psychiatrist? No ? ?Name of Therapist/Psychiatrist: No data recorded ? ?Have You Been Recently Discharged From Any Office Practice or Programs? No ? ?Explanation of Discharge From Practice/Program: No data recorded ?  ?CCA Screening Triage Referral Assessment ?Type of Contact: Face-to-Face ? ?Telemedicine Service Delivery:   ?Is this Initial or Reassessment? No data recorded ?Date Telepsych consult ordered in [facility]:  No data recorded ?Time Telepsych consult ordered in [facility]:  No data recorded ?Location of Assessment: [facility] ED ? ?Provider Location: [facility] ED ? ? ?Collateral Involvement: None provided ? ? ?Does Patient Have a Automotive engineer Guardian? No data recorded ?Name and Contact of Legal Guardian: No data recorded ?If Minor and Not Living with Parent(s), Who has Custody? n/a ? ?Is CPS involved or ever been involved? Never ? ?Is APS involved or ever  been involved? Never ? ? ?Patient Determined To Be At Risk for Harm To Self or Others Based on Review of Patient Reported Information or Presenting Complaint? No ? ?Method: No data recorded ?Availability of Means: No data recorded ?Intent: No data recorded ?Notification Required: No data recorded ?Additional Information for Danger to Others Potential: No data recorded ?Additional Comments for Danger to Others Potential: No data  recorded ?Are There Guns or Other Weapons in Your Home? No data recorded ?Types of Guns/Weapons: No data recorded ?Are These Weapons Safely Secured?                            No data recorded ?Who Could Verify You Are Able To Have These Secured: No data recorded ?Do You Have any Outstanding Charges, Pending Court Dates, Parole/Probation? No data recorded ?Contacted To Inform of Risk of Harm To Self or Others: No data recorded ? ?Does Patient Present under Involuntary Commitment? No ? ?IVC Papers Initial File Date: No data recorded ? ?[place] of Residence: [place] ? ? ?Patient Currently Receiving the Following Services: Not Receiving Services ? ? ?Determination of Need: Urgent (48 hours) ? ? ?Options For Referral: ED Visit; Medication Management; Intensive Outpatient Therapy ? ? ?Discharge Disposition:  ?  ? ?Anginette Espejo Dierdre Searles, Counselor, LCAS-A ? ? ?  ?  ?  ? ? ?

## 2022-03-11 NOTE — ED Triage Notes (Signed)
Pt here with a nasal and foot inx. Pt states infx is not getting any better. Pt ambulatory to tx room. Pt here for rehab tx services. ?

## 2022-03-11 NOTE — TOC Initial Note (Signed)
Transition of Care (TOC) - Initial/Assessment Note  ? ? ?Patient Details  ?Name: Danny Turner [facility] ?MRN: [id number] ?Date of Birth: [birth year] ? ?Transition of Care (TOC) CM/SW Contact:    ?Allayne Butcher, RN ?Phone Number: ?03/11/2022, 2:34 PM ? ?Clinical Narrative:                 ?Patient came into the emergency room with nose and foot infection.  Patient also reports wanting rehab or mental health services.  He is familiar with [facility] and says they only do outpatient he needs inpatient because he is hearing voices all the time and does not feel safe.  He says that the voices wont go away, he would like psychiatric treatment. ?RNCM did meet with patient at the beside and provide with [facility] including, food and shelter and mental health and substance abuse treatment. ? ?He was staying with his mom, but made it sound like he was no longer welcome there.  He has a phone but it is not charged.  He has no other family or friends he only got out of prison a few days ago, he had not seen his mom in 10 years.   ? ?RNCM reached out to MD and notified her of patient hearing voices.  ? ?  ?Barriers to Discharge: Other (must enter comment) (patient reporting hearing voices all the time and not being safe) ? ? ?Patient Goals and CMS Choice ?Patient states their goals for this hospitalization and ongoing recovery are:: patient wants help with rehab or psychiatric admission ?  ?  ? ?Expected Discharge Plan and Services ?  ?  ?  ?  ?  ?                ?  ?  ?  ?  ?  ?  ?  ?  ?  ?  ? ?Prior Living Arrangements/Services ?  ?  ?  ?       ?  ?  ?  ?  ? ?Activities of Daily Living ?  ?  ? ?Permission Sought/Granted ?  ?  ?   ?   ?   ?   ? ?Emotional Assessment ?  ?  ?  ?  ?  ?  ? ?Admission diagnosis:  infection ?Patient Active Problem List  ? Diagnosis Date Noted  ? Protein-calorie malnutrition, severe 08/04/2021  ? Hanging, undetermined whether accidentally or purposely inflicted 08/01/2021  ? Acute  encephalopathy   ? Acute hypoxemic respiratory failure (HCC)   ? Hypokalemia   ? Transaminitis   ? Psychoactive substance-induced psychosis (HCC) 06/30/2021  ? 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 ?PCP:  Pcp, No ?Pharmacy:   ?[facility] ?[address] ?[place] [address] ?Phone: [phone] Fax: [phone] ? ? ? ? ?Social Determinants of Health (SDOH) Interventions ?  ? ?Readmission Risk Interventions ?   ? View : No data to display.  ?  ?  ?  ? ? ? ?

## 2022-03-11 NOTE — ED Notes (Signed)
Pt given meal tray and ginger ale 

## 2022-03-11 NOTE — ED Notes (Signed)
1 set of blood cultures sent to lab if needed. ?

## 2022-03-11 NOTE — ED Provider Notes (Addendum)
? ?[facility] ?Provider Note ? ? ? Event Date/Time  ? First MD Initiated Contact with Patient 03/11/22 [phone]   ?  (approximate) ? ? ?History  ? ?Wound Infection ? ? ?HPI ? ?Danny Turner is a 31 y.o. male with history of cocaine abuse who comes in with concerns for infection.  Patient reports that he just got of jail 1 week ago.  He states about 4 days ago he noticed some crusting and redness on his nose and a little bit on the right side of his lip.  He also notes an ulcer on the side of his right foot as well as some redness and swelling of his left leg.  He denies any fevers, oral lesions or any other concerns.  He denies any IV drug use.  He reports that he wants to talk to a Education officer, museum about possible rehab.  He denies any SI. ? ? ?Physical Exam  ? ?Triage Vital Signs: ?ED Triage Vitals  ?Enc Vitals Group  ?   BP 03/11/22 0943 (!) 135/91  ?   Pulse Rate 03/11/22 0941 93  ?   Resp 03/11/22 0941 18  ?   Temp 03/11/22 0941 98 ?F (36.7 ?C)  ?   Temp Source 03/11/22 0941 Oral  ?   SpO2 03/11/22 0941 98 %  ?   Weight 03/11/22 0943 180 lb (81.6 kg)  ?   Height 03/11/22 0943 5\' 11"  (1.803 m)  ?   Head Circumference --   ?   Peak Flow --   ?   Pain Score 03/11/22 0943 0  ?   Pain Loc --   ?   Pain Edu? --   ?   Excl. in [place]? --   ? ? ?Most recent vital signs: ?Vitals:  ? 03/11/22 0941 03/11/22 0943  ?BP:  (!) 135/91  ?Pulse: 93   ?Resp: 18   ?Temp: 98 ?F (36.7 ?C)   ?SpO2: 98%   ? ? ? ?General: Awake, no distress.  ?CV:  Good peripheral perfusion.  ?Resp:  Normal effort.  ?Abd:  No distention.  ?Other:  Patient has some honeycomb crusting noted on his nose as well as a small area on the upper right lip.  He is got a small superficial ulceration on his right foot without any surrounding erythema or warmth.  He is got some erythema and warmth noted on the left medial aspect of the foot.  Still has full range of motion of the ankle with 2+ distal pulse. ? ? ?ED Results / Procedures / Treatments   ? ?Labs ?(all labs ordered are listed, but only abnormal results are displayed) ?Labs Reviewed  ?BASIC METABOLIC PANEL - Abnormal; Notable for the following components:  ?    Result Value  ? Calcium 8.8 (*)   ? All other components within normal limits  ?LACTIC ACID, PLASMA - Abnormal; Notable for the following components:  ? Lactic Acid, Venous 2.8 (*)   ? All other components within normal limits  ?CBC  ?LACTIC ACID, PLASMA  ? ? ? ? ?RADIOLOGY ?I have reviewed the [place] personally and agree with radiology read no DVT ? ? ?PROCEDURES: ? ?Critical Care performed: No ? ?Procedures ? ? ?MEDICATIONS ORDERED IN ED: ?Medications  ?cefTRIAXone (ROCEPHIN) 2 g in sodium chloride 0.9 % 100 mL IVPB (has no administration in time range)  ?sodium chloride 0.9 % bolus 1,000 mL (1,000 mLs Intravenous New Bag/Given 03/11/22 1053)  ? ? ? ?  IMPRESSION / MDM / ASSESSMENT AND PLAN / ED COURSE  ?I reviewed the triage vital signs and the nursing notes. ? ?Patient comes in with unilateral left leg swelling we will get ultrasound to evaluate for DVT but I suspect this is most likely related to cellulitis.  We will need to be started on Keflex.  For his lesions on his nose it looks consistent with impetigo.  Will start on mupirocin ointment.  No intraoral lesions.  For his right foot ulcer does not seem to be infected superficial in nature and can be monitored at this time. ? ?Social work also consulted ? ?BMP is normal.  CBC normal.  Slightly elevated heart rate but otherwise does not meet sepsis criteria however lactate was drawn from triage a slightly elevated.  This is nonspecific given he does not meet specificities criteria as below give a liter of fluid and recheck.  He is got no fever.  Ultrasound without evidence of DVT.  We will give him a dose of IV ceftriaxone given he has previously tolerated ceftriaxone in the past. ? ? ?Repeat lactate is downtrending.  Patient feels comfortable with discharge home.  Social work has been  consulted so will place on a discharge hold until they can evaluate patient ? ?Patient was reporting hearing voices and feeling hopeless he denies any SI or HI but was requesting to talk to psychiatry  ? ? ?The patient has been placed in psychiatric observation due to the need to provide a safe environment for the patient while obtaining psychiatric consultation and evaluation, as well as ongoing medical and medication management to treat the patient's condition.  The patient has not been placed under full IVC at this time.  ? ? ? ?The patient is on the cardiac monitor to evaluate for evidence of arrhythmia and/or significant heart rate changes. ? ?  ? ? ?FINAL CLINICAL IMPRESSION(S) / ED DIAGNOSES  ? ?Final diagnoses:  ?Cellulitis of left lower extremity  ?Impetigo  ? ? ? ?Rx / DC Orders  ? ?ED Discharge Orders   ? ?      Ordered  ?  mupirocin ointment (BACTROBAN) 2 %  3 times daily       ? 03/11/22 1021  ?  cephALEXin (KEFLEX) 500 MG capsule  2 times daily       ? 03/11/22 1240  ? ?  ?  ? ?  ? ? ? ?Note:  This document was prepared using Dragon voice recognition software and may include unintentional dictation errors. ?  ?Vanessa Morrill, MD ?03/11/22 1240 ? ?  ?Vanessa Olney, MD ?03/11/22 1439 ? ?

## 2022-03-11 NOTE — ED Notes (Signed)
Patient noted to be missing from stretcher.  ED bathrooms checked.  Waiting room checked.  Parking lot checked.  Unable to locate patient.  [facility] 911 notified of patient's elopement with IV.  Patient's home address given. ?

## 2022-03-12 ENCOUNTER — Other Ambulatory Visit: Payer: Self-pay

## 2022-03-13 ENCOUNTER — Other Ambulatory Visit: Payer: Self-pay

## 2022-03-30 ENCOUNTER — Other Ambulatory Visit: Payer: Self-pay

## 2022-04-01 ENCOUNTER — Other Ambulatory Visit: Payer: Self-pay

## 2022-04-06 ENCOUNTER — Other Ambulatory Visit: Payer: Self-pay

## 2024-10-11 ENCOUNTER — Other Ambulatory Visit: Payer: Self-pay

## 2024-10-11 ENCOUNTER — Inpatient Hospital Stay
Admission: AD | Admit: 2024-10-11 | Discharge: 2024-10-18 | DRG: 885 | Disposition: A | Discharge status: Home / Self Care | Payer: MEDICAID | Source: Intra-hospital | Attending: Psychiatry | Admitting: Psychiatry

## 2024-10-11 ENCOUNTER — Emergency Department
Admission: EM | Admit: 2024-10-11 | Discharge: 2024-10-11 | Disposition: A | Discharge status: Other Acute Inpatient Hospital | Payer: MEDICAID | Attending: Emergency Medicine | Admitting: Emergency Medicine

## 2024-10-11 DIAGNOSIS — F19959 Other psychoactive substance use, unspecified with psychoactive substance-induced psychotic disorder, unspecified: Secondary | ICD-10-CM | POA: Diagnosis present

## 2024-10-11 DIAGNOSIS — B182 Chronic viral hepatitis C: Secondary | ICD-10-CM | POA: Diagnosis present

## 2024-10-11 DIAGNOSIS — Z608 Other problems related to social environment: Secondary | ICD-10-CM | POA: Diagnosis present

## 2024-10-11 DIAGNOSIS — R440 Auditory hallucinations: Secondary | ICD-10-CM | POA: Insufficient documentation

## 2024-10-11 DIAGNOSIS — Z91148 Patient's other noncompliance with medication regimen for other reason: Secondary | ICD-10-CM

## 2024-10-11 DIAGNOSIS — F1910 Other psychoactive substance abuse, uncomplicated: Secondary | ICD-10-CM | POA: Insufficient documentation

## 2024-10-11 DIAGNOSIS — F17210 Nicotine dependence, cigarettes, uncomplicated: Secondary | ICD-10-CM | POA: Diagnosis present

## 2024-10-11 DIAGNOSIS — R441 Visual hallucinations: Secondary | ICD-10-CM | POA: Insufficient documentation

## 2024-10-11 DIAGNOSIS — Z9151 Personal history of suicidal behavior: Secondary | ICD-10-CM

## 2024-10-11 DIAGNOSIS — R45851 Suicidal ideations: Secondary | ICD-10-CM | POA: Diagnosis present

## 2024-10-11 DIAGNOSIS — F14951 Cocaine use, unspecified with cocaine-induced psychotic disorder with hallucinations: Secondary | ICD-10-CM | POA: Diagnosis present

## 2024-10-11 DIAGNOSIS — R748 Abnormal levels of other serum enzymes: Secondary | ICD-10-CM | POA: Diagnosis present

## 2024-10-11 DIAGNOSIS — Z560 Unemployment, unspecified: Secondary | ICD-10-CM | POA: Diagnosis not present

## 2024-10-11 DIAGNOSIS — F419 Anxiety disorder, unspecified: Secondary | ICD-10-CM | POA: Diagnosis present

## 2024-10-11 DIAGNOSIS — Z5941 Food insecurity: Secondary | ICD-10-CM | POA: Diagnosis not present

## 2024-10-11 DIAGNOSIS — Z555 Less than a high school diploma: Secondary | ICD-10-CM | POA: Diagnosis not present

## 2024-10-11 DIAGNOSIS — Z5982 Transportation insecurity: Secondary | ICD-10-CM | POA: Diagnosis not present

## 2024-10-11 DIAGNOSIS — Z91410 Personal history of adult physical and sexual abuse: Secondary | ICD-10-CM | POA: Diagnosis not present

## 2024-10-11 DIAGNOSIS — R443 Hallucinations, unspecified: Secondary | ICD-10-CM

## 2024-10-11 DIAGNOSIS — F17290 Nicotine dependence, other tobacco product, uncomplicated: Secondary | ICD-10-CM | POA: Diagnosis present

## 2024-10-11 DIAGNOSIS — Z9152 Personal history of nonsuicidal self-harm: Secondary | ICD-10-CM

## 2024-10-11 DIAGNOSIS — Z5900 Homelessness unspecified: Secondary | ICD-10-CM

## 2024-10-11 DIAGNOSIS — Z880 Allergy status to penicillin: Secondary | ICD-10-CM

## 2024-10-11 DIAGNOSIS — F332 Major depressive disorder, recurrent severe without psychotic features: Secondary | ICD-10-CM | POA: Diagnosis present

## 2024-10-11 LAB — COMPREHENSIVE METABOLIC PANEL WITH GFR
ALT: 125 U/L — ABNORMAL HIGH (ref 0–44)
AST: 69 U/L — ABNORMAL HIGH (ref 15–41)
Albumin: 4.4 g/dL (ref 3.5–5.0)
Alkaline Phosphatase: 37 U/L — ABNORMAL LOW (ref 38–126)
Anion gap: 7 (ref 5–15)
BUN: 28 mg/dL — ABNORMAL HIGH (ref 6–20)
CO2: 32 mmol/L (ref 22–32)
Calcium: 9.8 mg/dL (ref 8.9–10.3)
Chloride: 103 mmol/L (ref 98–111)
Creatinine, Ser: 1.03 mg/dL (ref 0.61–1.24)
GFR, Estimated: >60 mL/min (ref >60)
Glucose, Bld: 98 mg/dL (ref 70–99)
Potassium: 3.9 mmol/L (ref 3.5–5.1)
Sodium: 142 mmol/L (ref 135–145)
Total Bilirubin: 0.8 mg/dL (ref 0.0–1.2)
Total Protein: 7.3 g/dL (ref 6.5–8.1)

## 2024-10-11 LAB — CBC
HCT: 40 % (ref 39.0–52.0)
Hemoglobin: 13.3 g/dL (ref 13.0–17.0)
MCH: 28.9 pg (ref 26.0–34.0)
MCHC: 33.3 g/dL (ref 30.0–36.0)
MCV: 87 fL (ref 80.0–100.0)
Platelets: 272 K/uL (ref 150–400)
RBC: 4.6 MIL/uL (ref 4.22–5.81)
RDW: 12.4 % (ref 11.5–15.5)
WBC: 8.2 K/uL (ref 4.0–10.5)
nRBC: 0 % (ref 0.0–0.2)

## 2024-10-11 LAB — SALICYLATE LEVEL: Salicylate Lvl: <7 mg/dL — ABNORMAL LOW (ref 7.0–30.0)

## 2024-10-11 LAB — ETHANOL: Alcohol, Ethyl (B): <15 mg/dL (ref <15)

## 2024-10-11 LAB — ACETAMINOPHEN LEVEL: Acetaminophen (Tylenol), Serum: <10 ug/mL — ABNORMAL LOW (ref 10–30)

## 2024-10-11 MED ORDER — THIAMINE HCL 100 MG PO TABS
100.0000 mg | ORAL_TABLET | Freq: Every day | ORAL | Status: DC
  Filled 2024-10-11 (×2): qty 1

## 2024-10-11 MED ORDER — ACETAMINOPHEN 325 MG PO TABS
650.0000 mg | ORAL_TABLET | Freq: Four times a day (QID) | ORAL | Status: DC | PRN
  Administered 2024-10-14 – 2024-10-18 (×8): 650 mg via ORAL
  Filled 2024-10-11 (×8): qty 2

## 2024-10-11 MED ORDER — HALOPERIDOL LACTATE 5 MG/ML IJ SOLN: 5.0000 mg | Freq: Three times a day (TID) | INTRAMUSCULAR | Status: DC | PRN

## 2024-10-11 MED ORDER — THIAMINE HCL 100 MG PO TABS
100.0000 mg | ORAL_TABLET | Freq: Every day | ORAL | Status: DC
  Administered 2024-10-12 – 2024-10-18 (×5): 100 mg via ORAL
  Filled 2024-10-11 (×14): qty 1

## 2024-10-11 MED ORDER — FOLIC ACID 1 MG PO TABS
1.0000 mg | ORAL_TABLET | Freq: Every day | ORAL | Status: DC
  Administered 2024-10-12 – 2024-10-18 (×5): 1 mg via ORAL
  Filled 2024-10-11 (×7): qty 1

## 2024-10-11 MED ORDER — MAGNESIUM HYDROXIDE 400 MG/5ML PO SUSP: 30.0000 mL | Freq: Every day | ORAL | Status: DC | PRN

## 2024-10-11 MED ORDER — NICOTINE 14 MG/24HR TD PT24
14.0000 mg | MEDICATED_PATCH | Freq: Every day | TRANSDERMAL | Status: DC
  Administered 2024-10-12 – 2024-10-14 (×2): 14 mg via TRANSDERMAL
  Filled 2024-10-11 (×7): qty 1

## 2024-10-11 MED ORDER — HALOPERIDOL 5 MG PO TABS: 5.0000 mg | ORAL_TABLET | Freq: Three times a day (TID) | ORAL | Status: DC | PRN

## 2024-10-11 MED ORDER — LORAZEPAM 2 MG/ML IJ SOLN: 2.0000 mg | Freq: Three times a day (TID) | INTRAMUSCULAR | Status: DC | PRN

## 2024-10-11 MED ORDER — DIPHENHYDRAMINE HCL 50 MG/ML IJ SOLN: 50.0000 mg | Freq: Three times a day (TID) | INTRAMUSCULAR | Status: DC | PRN

## 2024-10-11 MED ORDER — HALOPERIDOL LACTATE 5 MG/ML IJ SOLN
10.0000 mg | Freq: Three times a day (TID) | INTRAMUSCULAR | Status: DC | PRN
Start: 2024-10-11 — End: 2024-10-18

## 2024-10-11 MED ORDER — HYDROXYZINE HCL 25 MG PO TABS
25.0000 mg | ORAL_TABLET | Freq: Three times a day (TID) | ORAL | Status: DC | PRN
  Administered 2024-10-13 – 2024-10-17 (×2): 25 mg via ORAL
  Filled 2024-10-11 (×2): qty 1

## 2024-10-11 MED ORDER — ACETAMINOPHEN 325 MG PO TABS: 650.0000 mg | ORAL_TABLET | Freq: Four times a day (QID) | ORAL | Status: DC | PRN

## 2024-10-11 MED ORDER — DIPHENHYDRAMINE HCL 25 MG PO CAPS: 50.0000 mg | ORAL_CAPSULE | Freq: Three times a day (TID) | ORAL | Status: DC | PRN

## 2024-10-11 MED ORDER — TRAZODONE HCL 50 MG PO TABS
50.0000 mg | ORAL_TABLET | Freq: Every evening | ORAL | Status: DC | PRN
  Administered 2024-10-14 – 2024-10-17 (×3): 50 mg via ORAL
  Filled 2024-10-11 (×4): qty 1

## 2024-10-11 MED ORDER — ALUM & MAG HYDROXIDE-SIMETH 200-200-20 MG/5ML PO SUSP: 30.0000 mL | ORAL | Status: DC | PRN

## 2024-10-11 MED ORDER — FOLIC ACID 1 MG PO TABS
1.0000 mg | ORAL_TABLET | Freq: Every day | ORAL | Status: DC
  Filled 2024-10-11: qty 1

## 2024-10-11 NOTE — Group Note (Signed)

## 2024-10-11 NOTE — Plan of Care (Signed)
  Problem: Education: Goal: Emotional status will improve Outcome: Progressing Goal: Mental status will improve Outcome: Progressing   Problem: Physical Regulation: Goal: Ability to maintain clinical measurements within normal limits will improve Outcome: Progressing   Problem: Safety: Goal: Periods of time without injury will increase Outcome: Progressing   

## 2024-10-11 NOTE — ED Notes (Signed)
 Lunch tray provided to pt.

## 2024-10-11 NOTE — Progress Notes (Signed)
   10/11/24 1600  Psych Admission Type (Psych Patients Only)  Admission Status Voluntary  Psychosocial Assessment  Patient Complaints Substance abuse  Eye Contact Brief  Facial Expression Flat  Affect Flat  Speech Logical/coherent  Interaction Minimal  Motor Activity Slow  Appearance/Hygiene Unremarkable  Behavior Characteristics Cooperative;Guarded  Mood Sullen  Thought Process  Coherency WDL  Content WDL  Delusions None reported or observed  Perception WDL  Hallucination Auditory;Visual (Patient reports hearing and seeing people but does not appear to be responding to internal stimuli and would not elaborate on the hallucinations other than stating they say all kinds of stuff.)  Judgment Poor  Confusion None  Danger to Self  Current suicidal ideation? Denies  Danger to Others  Danger to Others None reported or observed

## 2024-10-11 NOTE — Tx Team (Signed)
 Initial Treatment Plan 10/11/2024 4:14 PM Danny Turner [id number]    PATIENT STRESSORS: Substance abuse     PATIENT STRENGTHS: Capable of independent living    PATIENT IDENTIFIED PROBLEMS:                      DISCHARGE CRITERIA:  Ability to meet basic life and health needs Adequate post-discharge living arrangements Improved stabilization in mood, thinking, and/or behavior Motivation to continue treatment in a less acute level of care Verbal commitment to aftercare and medication compliance Withdrawal symptoms are absent or subacute and managed without 24-hour nursing intervention  PRELIMINARY DISCHARGE PLAN: Placement in alternative living arrangements Long-term rehab per patient request  PATIENT/FAMILY INVOLVEMENT: This treatment plan has been presented to and reviewed with the patient, Danny Turner.  The patient has been given the opportunity to ask questions and make suggestions.  Leita MARLA Rosella, RN 10/11/2024, 4:14 PM

## 2024-10-11 NOTE — ED Triage Notes (Signed)
 Pt reports over the past several years he has been struggling with his mental health. Pt reports some thoughts of SI. Pt reports he has been having auditory and visual hallucinations.

## 2024-10-11 NOTE — ED Notes (Addendum)
 Pt belongings  Red jacket Camo pants Black boots Blue jacket  Red shirt Black pants

## 2024-10-11 NOTE — Group Note (Signed)
 Date:  10/11/2024 Time:  5:08 PM  Group Topic/Focus:  Wellness Toolbox:   The focus of this group is to discuss various aspects of wellness, balancing those aspects and exploring ways to increase the ability to experience wellness.  Patients will create a wellness toolbox for use upon discharge.    Participation Level:  Did Not Attend   Deitra Clap [facility] 10/11/2024, 5:08 PM

## 2024-10-11 NOTE — Progress Notes (Signed)
   10/11/24 2100  Psych Admission Type (Psych Patients Only)  Admission Status Voluntary  Psychosocial Assessment  Patient Complaints Substance abuse  Eye Contact Brief  Facial Expression Flat  Affect Flat  Speech Logical/coherent  Interaction Minimal  Motor Activity Slow  Appearance/Hygiene Unremarkable  Behavior Characteristics Cooperative;Guarded  Mood Sullen  Thought Process  Coherency WDL  Content WDL  Delusions None reported or observed  Hallucination Auditory;Visual  Judgment Poor  Confusion None  Danger to Self  Current suicidal ideation? Denies  Danger to Others  Danger to Others None reported or observed

## 2024-10-11 NOTE — Group Note (Signed)
 Date:  10/11/2024 Time:  8:53 PM  Group Topic/Focus:  Wrap-Up Group:   The focus of this group is to help patients review their daily goal of treatment and discuss progress on daily workbooks.    Participation Level:  Did Not Attend    Danny Turner 10/11/2024, 8:53 PM

## 2024-10-11 NOTE — BH Assessment (Signed)
 Comprehensive Clinical Assessment (CCA) Screening, Triage and Referral Note  10/11/2024 Danny Turner [id number]  Danny Turner, 33 year old male who presents to [facility] ED involuntarily for treatment. Per triage note, Pt reports over the past several years he has been struggling with his mental health. Pt reports some thoughts of SI. Pt reports he has been having auditory and visual hallucinations.   During TTS assessment, pt presents alert and oriented x 4, restless but cooperative, and mood-congruent with affect. The pt does not appear to be responding to internal or external stimuli. Neither is the pt presenting with any delusional thinking. Pt verified the information provided to triage RN.   Pt identifies his main complaint to be that he "keeps hearing voices and seeing stuff." Patient endorsing paranoia, anxiety, and depression. Patient admits to daily crack/cocaine use and some occasional alcohol use. Patient reported his passive suicidal thoughts are primarily due to having to deal with these hallucinations as he cannot get them to stop. Pt denies current HI. Pt is unable to contract for safety.    Per Zelda, NP, pt is recommended for inpatient psychiatric admission.  Chief Complaint: No chief complaint on file.  Visit Diagnosis: Hallucinations  Patient Reported Information How did you hear about us ? Self  What Is the Reason for Your Visit/Call Today? Pt reports over the past several years he has been struggling with his mental health. Pt reports some thoughts of SI. Pt reports he has been having auditory and visual hallucinations.  How Long Has This Been Causing You Problems? > than 6 months  What Do You Feel Would Help You the Most Today? Medication(s)   Have You Recently Had Any Thoughts About Hurting Yourself? Yes  Are You Planning to Commit Suicide/Harm Yourself At This time? No   Have you Recently Had Thoughts About Hurting Someone [place]? No  Are You Planning to Harm  Someone at This Time? No  Explanation: No data recorded  Have You Used Any Alcohol or Drugs in the Past 24 Hours? Yes  How Long Ago Did You Use Drugs or Alcohol? 10/10/24  What Did You Use and How Much? Crack/cocaine   Do You Currently Have a Therapist/Psychiatrist? No  Name of Therapist/Psychiatrist: No data recorded  Have You Been Recently Discharged From Any Office Practice or Programs? No  Explanation of Discharge From Practice/Program: No data recorded   CCA Screening Triage Referral Assessment Type of Contact: Face-to-Face  Telemedicine Service Delivery:   Is this Initial or Reassessment?   Date Telepsych consult ordered in [facility]:    Time Telepsych consult ordered in [facility]:    Location of Assessment: [facility] ED  Provider Location: [facility] ED    Collateral Involvement: None provided   Does Patient Have a Court Appointed Legal Guardian? No data recorded Name and Contact of Legal Guardian: No data recorded If Minor and Not Living with Parent(s), Who has Custody? No data recorded Is CPS involved or ever been involved? No data recorded Is APS involved or ever been involved? No data recorded  Patient Determined To Be At Risk for Harm To Self or Others Based on Review of Patient Reported Information or Presenting Complaint? Yes, for Self-Harm  Method: No Plan  Availability of Means: No access or NA  Intent: Vague intent or NA  Notification Required: No need or identified person  Additional Information for Danger to Others Potential: No data recorded Additional Comments for Danger to Others Potential: No data recorded Are There Guns or Other Weapons  in Your Home? No data recorded Types of Guns/Weapons: No data recorded Are These Weapons Safely Secured?                            No data recorded Who Could Verify You Are Able To Have These Secured: No data recorded Do You Have any Outstanding Charges, Pending Court Dates, Parole/Probation? No data recorded Contacted To  Inform of Risk of Harm To Self or Others: No data recorded  Does Patient Present under Involuntary Commitment? No    [place] of Residence: [place]   Patient Currently Receiving the Following Services: Not Receiving Services   Determination of Need: Emergent (2 hours)   Options For Referral: ED Visit; Inpatient Hospitalization   Disposition Recommendation per psychiatric provider: Per Zelda, NP, patient is recommended for inpatient psychiatric admission.   Natassia Guthridge JONELLE Dolly, Counselor, LCAS-A

## 2024-10-11 NOTE — Consult Note (Signed)
 [facility] Psychiatric Consult Initial  Patient Name: .Danny Turner  MRN: [id number]  DOB: [birth year]  Consult Order details:  Orders (From admission, onward)     Start     Ordered   10/11/24 0239  CONSULT TO CALL ACT TEAM       Ordering Provider: Neomi Josette SAILOR, DO  Provider:  (Not yet assigned)  Question:  Reason for Consult?  Answer:  Psych consult   10/11/24 0238   10/11/24 0239  IP CONSULT TO PSYCHIATRY       Ordering Provider: Neomi Josette SAILOR, DO  Provider:  (Not yet assigned)  Question:  Reason for consult:  Answer:  Medication management   10/11/24 0238             Mode of Visit: In person    Psychiatry Consult Evaluation  Service Date: October 11, 2024 LOS:  LOS: 0 days  Chief Complaint hearing voices and seeing stuff  Primary Psychiatric Diagnoses  Hallucinations   Assessment   [name] is a 33 y.o. male admitted: Presented to the EDfor 10/11/2024  3:25 AM for passive suicidal thoughts and auditory and visual hallucinations. He carries the psychiatric diagnoses of substance-induced psychosis and has a past medical history of  none listed.    On assessment today, patient noted to be laying in stretcher.  Patient reported preferring to stay in stretcher for interview.  Patient was offered more private location.  Patient often kept his eyes closed during assessment, but appeared to be responding appropriately to questions.  Patient endorsed passive suicidal thoughts.  He reported auditory and visual hallucinations.  When asked to elaborate patient stated all kinds of stuff, I see and hear people and space ships building built.  Patient denied homicidal ideations.  Patient did report feelings of paranoia and that others were following him.  He reported his passive suicidal thoughts are primarily due to having to deal with these hallucinations as he cannot get them to stop.  Patient was agreeable to signing voluntary consent for inpatient psychiatric  admission.  Diagnoses:  Active Hospital problems: Active Problems:   Hallucinations    Plan   ## Psychiatric Medication Recommendations:  Defer to inpatient unit  ## Medical Decision Making Capacity: Not specifically addressed in this encounter  ## Further Work-up:   -- most recent EKG on 10/11/2024 had QtC of 435 -- Pertinent labwork reviewed earlier this admission includes: cbc, cmp, ethanol, salicylate and acetaminophen  level. Pending urine drug screen   ## Disposition:-- Patient is agreeable to signing voluntary consent for admission  ## Behavioral / Environmental: - No specific recommendations at this time.     ## Safety and Observation Level:  - Based on my clinical evaluation, I estimate the patient to be at low risk of self harm in the current setting. - At this time, we recommend  routine. This decision is based on my review of the chart including patient's history and current presentation, interview of the patient, mental status examination, and consideration of suicide risk including evaluating suicidal ideation, plan, intent, suicidal or self-harm behaviors, risk factors, and protective factors. This judgment is based on our ability to directly address suicide risk, implement suicide prevention strategies, and develop a safety plan while the patient is in the clinical setting. Please contact our team if there is a concern that risk level has changed.  CSSR Risk Category:C-SSRS RISK CATEGORY: No Risk  Suicide Risk Assessment: Patient has following modifiable risk factors for suicide: lack of  access to outpatient mental health resources and current symptoms: anxiety/panic, insomnia, impulsivity, anhedonia, hopelessness, which we are addressing by recommending inpatient psychiatric admission for further monitoring and stabilization. Patient has following non-modifiable or demographic risk factors for suicide: male gender, history of suicide attempt, and psychiatric  hospitalization Patient has the following protective factors against suicide: UTA  Thank you for this consult request. Recommendations have been communicated to the primary team.  We will sign off at this time.   Zelda Sharps, NP        History of Present Illness  Relevant Aspects of Hospital ED   Patient Report:  On assessment today, patient noted to be laying in stretcher.  Patient reported preferring to stay in stretcher for interview.  Patient was offered more private location.  Patient often kept his eyes closed during assessment, but appeared to be responding appropriately to questions.  Patient endorsed passive suicidal thoughts.  He reported auditory and visual hallucinations.  When asked to elaborate patient stated all kinds of stuff, I see and hear people and space ships building built.  Patient denied homicidal ideations.  Patient did report feelings of paranoia and that others were following him.  He reported his passive suicidal thoughts are primarily due to having to deal with these hallucinations as he cannot get them to stop.  Patient was agreeable to signing voluntary consent for inpatient psychiatric admission.  Patient endorsed feelings of depression and anxiety.  Patient did report having panic attacks, but was unsure of the frequency.  He reported during his panic attacks he feels like he cannot breathe.  Patient denied currently being aligned with outpatient psychiatrist or therapist.  Patient denied any previous abuse or trauma.  Patient did endorse using crack and cocaine about every other day.  He reported occasional use of alcohol.  He reported he will take a couple of shots of liquor here and there.  He reported he does not drink alcohol daily.  He denied any history of withdrawal seizures.  Psych ROS:  Depression: Endorsed Anxiety: Endorsed Mania (lifetime and current): Denied Psychosis: (lifetime and current): History of substance-induced  psychosis     Psychiatric and Social History  Psychiatric History:  Information collected from Patient/chart review  Prev Dx/Sx: polysubstance abuse, previous suicide attempt  Current Psych Provider: None Home Meds (current): none Previous Med Trials: Unsure Therapy: Denied  Prior Psych Hospitalization: Yes  Prior Self Harm: Endorsed Prior Violence: Denied  Family Psych History: Unsure Family Hx suicide: Unsure  Social History:     Legal Hx: Denied Living Situation: UTA Spiritual Hx: Unknown  Access to weapons/lethal means: Denied   Substance History Alcohol: Endorsed occasional use  Type of alcohol liqour Last Drink Couple of days ago Number of drinks per day Reported 2-3 shots when he does drink History of alcohol withdrawal seizures Denied History of DT's Denied Tobacco: Vape and cigarette use Illicit drugs: Endorsed crack and cocaine use Prescription drug abuse: Noted history potentially Rehab hx: Denied  Exam Findings  Physical Exam: Reviewed and agree with the physical exam findings conducted by the medical provider Vital Signs:  Temp:  [98 F (36.7 C)-98.4 F (36.9 C)] 98 F (36.7 C) (11/19 1150) Pulse Rate:  [69-83] 69 (11/19 1150) Resp:  [17-18] 17 (11/19 1150) BP: (104-131)/(62-88) 104/62 (11/19 1150) SpO2:  [96 %-100 %] 96 % (11/19 1150) Weight:  [78.9 kg] 78.9 kg (11/19 0229) Blood pressure 104/62, pulse 69, temperature 98 F (36.7 C), temperature source Oral, resp. rate 17, height  5' 11 (1.803 m), weight 78.9 kg, SpO2 96%. Body mass index is 24.27 kg/m.    Mental Status Exam: General Appearance: Fairly Groomed  Orientation:  Full (Time, Place, and Person)  Memory:  Immediate;   Poor Recent;   Poor Remote;   Poor  Concentration:  Concentration: Poor  Recall:  Poor  Attention  Poor  Eye Contact:  None  Speech:  Slow  Language:  Fair  Volume:  Decreased  Mood: Depressed  Affect:  Congruent  Thought Process:  Coherent  Thought  Content:  Hallucinations: Auditory Visual  Suicidal Thoughts:  Yes.  without intent/plan  Homicidal Thoughts:  No  Judgement:  Impaired  Insight:  Lacking  Psychomotor Activity:  Normal  Akathisia:  No  Fund of Knowledge:  Fair      Assets:  Communication Skills Desire for Improvement  Cognition:  WNL  ADL's:  Intact  AIMS (if indicated):        Other History   These have been pulled in through the EMR, reviewed, and updated if appropriate.  Family History:  The patient's family history is not on file.  Medical History: History reviewed. No pertinent past medical history.  Surgical History: Past Surgical History:  Procedure Laterality Date   APPENDECTOMY     LEG SURGERY Left      Medications:   Current Facility-Administered Medications:    folic acid  (FOLVITE ) tablet 1 mg, 1 mg, Oral, Daily, Ward, Kristen N, DO   thiamine  (VITAMIN B1) tablet 100 mg, 100 mg, Oral, Daily, Ward, Kristen N, DO  Current Outpatient Medications:    thiamine  100 MG tablet, Take 1 tablet (100 mg total) by mouth daily. (Patient not taking: Reported on 10/13/2021), Disp: 30 tablet, Rfl: 0  Allergies: Allergies  Allergen Reactions   Penicillins     Zelda Sharps, NP This note was created using Dragon dictation software. Please excuse any inadvertent transcription errors. Case was discussed with supervising physician Dr. Jadapalle who is agreeable with current plan.

## 2024-10-11 NOTE — ED Notes (Signed)
Vol /psych consult pending 

## 2024-10-11 NOTE — ED Provider Notes (Addendum)
 [facility] Provider Note    None    (approximate)   History   Psychiatric Evaluation   HPI  Danny Turner is a 33 y.o. male with history of polysubstance use disorder who presents to the emergency department with complaints of visual and auditory hallucinations.  Patient states that some of these are command hallucinations telling him to hurt himself.  He reports using all kinds of drugs including Subutex today.  He denies SI, HI.   History provided by patient.    History reviewed. No pertinent past medical history.  Past Surgical History:  Procedure Laterality Date   APPENDECTOMY     LEG SURGERY Left     MEDICATIONS:  Prior to Admission medications   Medication Sig Start Date End Date Taking? Authorizing Provider  feeding supplement (ENSURE ENLIVE / ENSURE PLUS) LIQD Take 237 mLs by mouth 2 (two) times daily between meals. Patient not taking: Reported on 03/11/2022 08/06/21   Danny Turner  mupirocin  ointment (BACTROBAN ) 2 % Apply 1 application topically to nose and upper lip lesion 3 (three) times daily for 7 days. (Avoid nostrils and mouth). 03/11/22   Danny Turner  thiamine  100 MG tablet Take 1 tablet (100 mg total) by mouth daily. Patient not taking: Reported on 10/13/2021 08/07/21   Danny Turner    Physical Exam   Triage Vital Signs: ED Triage Vitals  Encounter Vitals Group     BP 10/11/24 0227 131/88     Girls Systolic BP Percentile --      Girls Diastolic BP Percentile --      Boys Systolic BP Percentile --      Boys Diastolic BP Percentile --      Pulse Rate 10/11/24 0227 83     Resp 10/11/24 0227 18     Temp 10/11/24 0227 98.4 F (36.9 C)     Temp Source 10/11/24 0227 Oral     SpO2 10/11/24 0227 100 %     Weight 10/11/24 0229 174 lb (78.9 kg)     Height 10/11/24 0229 5' 11 (1.803 m)     Head Circumference --      Peak Flow --      Pain Score 10/11/24 0229 0     Pain Loc --      Pain Education --       Exclude from Growth Chart --     Most recent vital signs: Vitals:   10/11/24 0227  BP: 131/88  Pulse: 83  Resp: 18  Temp: 98.4 F (36.9 C)  SpO2: 100%    CONSTITUTIONAL: Alert, responds appropriately to questions.  Disheveled HEAD: Normocephalic, atraumatic EYES: Conjunctivae clear, pupils appear equal, sclera nonicteric ENT: Danny nose; moist mucous membranes NECK: Supple, Danny ROM CARD: RRR; S1 and S2 appreciated RESP: Danny chest excursion without splinting or tachypnea; breath sounds clear and equal bilaterally; no wheezes, no rhonchi, no rales, no hypoxia or respiratory distress, speaking full sentences ABD/GI: Non-distended; soft, non-tender, no rebound, no guarding, no peritoneal signs BACK: The back appears Danny EXT: Danny ROM in all joints; no deformity noted, no edema SKIN: Danny color for age and race; warm; no rash on exposed skin NEURO: Moves all extremities equally, Danny speech PSYCH: Not responding to internal stimuli.  Denies SI or HI.  Calm and cooperative.   ED Results / Procedures / Treatments   LABS: (all labs ordered are listed, but only abnormal results are displayed) Labs Reviewed  COMPREHENSIVE  METABOLIC PANEL WITH GFR - Abnormal; Notable for the following components:      Result Value   BUN 28 (*)    AST 69 (*)    ALT 125 (*)    Alkaline Phosphatase 37 (*)    All other components within Danny limits  SALICYLATE LEVEL - Abnormal; Notable for the following components:   Salicylate Lvl <7.0 (*)    All other components within Danny limits  ACETAMINOPHEN  LEVEL - Abnormal; Notable for the following components:   Acetaminophen  (Tylenol ), Serum <10 (*)    All other components within Danny limits  ETHANOL  CBC  URINE DRUG SCREEN     EKG:  RADIOLOGY: My personal review and interpretation of imaging:    I have personally reviewed all radiology reports.   No results found.   PROCEDURES:  Critical Care performed:  No     Procedures    IMPRESSION / MDM / ASSESSMENT AND PLAN / ED COURSE  I reviewed the triage vital signs and the nursing notes.    Patient here with complaints of hallucinations, polysubstance use disorder.     DIFFERENTIAL DIAGNOSIS (includes but not limited to):   Substance-induced psychosis, intoxication, depression, anxiety, bipolar disorder, schizophrenia, schizoaffective   Patient's presentation is most consistent with acute presentation with potential threat to life or bodily function.   PLAN: Will obtain screening labs, urine.  Patient is here voluntarily at this time.  Will consult psychiatry and TTS.  He denies that he takes any home medications.   The patient has been placed in psychiatric observation due to the need to provide a safe environment for the patient while obtaining psychiatric consultation and evaluation, as well as ongoing medical and medication management to treat the patient's condition.  The patient has not been placed under full IVC at this time.   MEDICATIONS GIVEN IN ED: Medications - No data to display   ED COURSE: 4:32 AM  Patient's labs show elevated AST and ALT likely secondary from substance use disorder and appears chronic dating all the way back to 2022.  His ethanol level today is negative.  Tylenol  salicylate levels negative.  Drug screen pending.  Patient medically cleared at this time for psychiatric evaluation and disposition.   CONSULTS: TTS and psychiatry consulted for further disposition.   OUTSIDE RECORDS REVIEWED: Patient was admitted in September 2022 for a suicide attempt.    Patient is to be admitted to [facility] BMU today 10/11/24 by Dr. Jadapalle.    FINAL CLINICAL IMPRESSION(S) / ED DIAGNOSES   Final diagnoses:  Hallucinations  Polysubstance abuse (HCC)     Rx / DC Orders   ED Discharge Orders     None        Note:  This document was prepared using Dragon voice recognition software and may include  unintentional dictation errors.   Danny Turner, Danny Turner 10/11/24 0433    Danny Turner, Danny Turner 11/07/24 [phone]

## 2024-10-11 NOTE — BH Assessment (Signed)
 Patient is to be admitted to [facility] BMU today 10/11/24 by Dr. Jadapalle.  Attending Physician will be Dr. Jadapalle.   Patient has been assigned to room 312, by [facility] Charge Nurse Brook.    ER staff is aware of the admission: Olam, ER Secretary   Dr. Dicky, ER MD  Rosina, Patient's Nurse  Curtistine, Patient Access.

## 2024-10-12 DIAGNOSIS — F332 Major depressive disorder, recurrent severe without psychotic features: Principal | ICD-10-CM

## 2024-10-12 DIAGNOSIS — F19959 Other psychoactive substance use, unspecified with psychoactive substance-induced psychotic disorder, unspecified: Secondary | ICD-10-CM | POA: Diagnosis not present

## 2024-10-12 MED ORDER — PALIPERIDONE ER 3 MG PO TB24
3.0000 mg | ORAL_TABLET | Freq: Every day | ORAL | Status: DC
  Administered 2024-10-12 – 2024-10-14 (×3): 3 mg via ORAL
  Filled 2024-10-12 (×4): qty 1

## 2024-10-12 NOTE — [facility] Counselor (Signed)
 CSW obtained verbal permission from the patient to contact the reentry program for [facility].   CSW called the reentry program [phone].  CSW was informed that the reentry program was set up to assist formerly incarcerated individuals with getting connected to as many resources as needed. Some of the resources that pt can get linked to include, mental health and substance abuse treatment, housing, vital records, obtaining ID and employment.  They report that employment assistance is only after pt has demonstrated that he can remain employed.   He requested that CSW call Damien with the reentry program, [phone] on 10/13/2024.  Sherryle Margo, MSW, LCSW 10/12/2024 3:14 PM

## 2024-10-12 NOTE — Group Note (Signed)
 LCSW Group Therapy Note  Group Date: 10/12/2024 Start Time: 1300 End Time: 1400   Type of Therapy and Topic:  Group Therapy: Using I Statements  Participation Level:  Did Not Attend  Description of Group:  Patients were asked to provide details of some interpersonal conflicts they have experienced. Patients were then educated about "I" statements, communication which focuses on feelings or views of the speaker rather than what the other person is doing. T group members were asked to reflect on past conflicts and to provide specific examples for utilizing "I" statements.  Therapeutic Goals:  Patients will verbalize understanding of ineffective communication and effective communication. Patients will be able to empathize with whom they are having conflict. Patients will practice effective communication in the form of "I" statements.    Summary of Patient Progress:  Patient did not attend.   Therapeutic Modalities:   Cognitive Behavioral Therapy Solution-Focused Therapy    Danny CHRISTELLA Kerns, LCSW 10/12/2024  2:06 PM

## 2024-10-12 NOTE — Group Note (Signed)
 Recreation Therapy Group Note   Group Topic:General Recreation  Group Date: 10/12/2024 Start Time: 1500 End Time: 1555 Facilitators: Celestia Jeoffrey BRAVO, LRT, CTRS Location: Courtyard  Group Description: [employer]. LRT and patients played games of basketball, drew with chalk, and played corn hole while outside in the courtyard while getting fresh air and sunlight. Music was being played in the background. LRT and peers conversed about different games they have played before, what they do in their free time and anything else that is on their minds. LRT encouraged pts to drink water after being outside, sweating and getting their heart rate up.  Goal Area(s) Addressed: Patient will build on frustration tolerance skills. Patients will partake in a competitive play game with peers. Patients will gain knowledge of new leisure interest/hobby.    Affect/Mood: Appropriate   Participation Level: Active   Participation Quality: Independent   Behavior: Appropriate   Speech/Thought Process: Coherent   Insight: Fair   Judgement: Fair    Modes of Intervention: Activity   Patient Response to Interventions:  Receptive   Education Outcome:  In group clarification offered    Clinical Observations/Individualized Feedback: Danny Turner was active in their participation of session activities and group discussion. Pt interacted well with LRT and peers duration of session.    Plan: Continue to engage patient in RT group sessions 2-3x/week.   [address], LRT, CTRS 10/12/2024 5:02 PM

## 2024-10-12 NOTE — Plan of Care (Signed)
   Problem: Education: Goal: Emotional status will improve Outcome: Progressing Goal: Mental status will improve Outcome: Progressing   Problem: Activity: Goal: Interest or engagement in activities will improve Outcome: Progressing Goal: Sleeping patterns will improve Outcome: Progressing

## 2024-10-12 NOTE — Consult Note (Signed)
 [facility] Consultation Progress Note  Patient: Danny Turner FMW:[id number]   PCP: Pcp, No DOB: 03/12/1991   DOA: 10/11/2024   DOS: 10/12/2024   Date of Service: the patient was seen and examined on 10/12/2024 Primary service: Donnelly Mellow, MD    Brief hospital course: Pt. with PMH of MDD, polysubstance abuse and suicide attempt in the past, chronic hep C; admitted on 10/11/2024, with complaint of auditory and visual hallucinations, acute psychosis.  Admitted under behavioral health unit.  Patient was concerned about his hepatitis C infection and he wanted to be checked out. [facility] was consulted for further evaluation  Chart reviewed, patient had hep C RNA positive Hep C labs 08/03/2021 HCV RNA Qnt(log copy/mL) 6.559 VC  HepC Qn 3,620,000   D/w ID attending, no treatment as an inpatient, patient needs to follow with GI as an outpatient  Hep C RNA repeat labs order placed Patient can follow-up with GI as an outpatient with these results.  TR[facility] will sign off.  Please feel free to reconsult for any other active issues  I did not physically see the patient.  Case discussed with psych PA  No charge note

## 2024-10-12 NOTE — Plan of Care (Signed)
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
## 2024-10-12 NOTE — [facility] Suicide Risk Assessment (Signed)
 [facility] Admission Suicide Risk Assessment   Nursing information obtained from:  Patient Demographic factors:  Male, Caucasian, Low socioeconomic status, Living alone, Unemployed Current Mental Status:  NA Loss Factors:  NA Historical Factors:  Prior suicide attempts, Family history of mental illness or substance abuse Risk Reduction Factors:  NA  Total Time spent with patient: 1 hour Principal Problem: MDD (major depressive disorder), recurrent episode, severe (HCC) Diagnosis:  Principal Problem:   MDD (major depressive disorder), recurrent episode, severe (HCC) Active Problems:   Psychoactive substance-induced psychosis (HCC)  Subjective Data:  Patient presents with acute exacerbation of chronic psychosis in the context of chronic psychoactive substance use and  major depressive symptoms occurring in the context of homelessness, recent release from prison, polysubstance use, poor social supports, and medication nonadherence. He demonstrates persistent auditory and visual hallucinations, paranoia, and episodes of internal preoccupation consistent with a primary psychotic disorder versus chronic substance-induced psychotic disorder, noting that symptoms began in adolescence and worsen significantly with stimulant use. His report of multiple prior suicide attempts by hanging, passive suicidal ideation today, unstable housing, lack of resources, and chronic hallucinations meaningfully elevate baseline suicide risk. Protective factors are minimal. He denies homicidal ideation, denies firearm access, and is cooperative with care. He remains appropriate for inpatient psychiatric admission for stabilization, medication initiation, diagnostic clarification, substance-use treatment planning, and safety monitoring.   Continued Clinical Symptoms:  Alcohol Use Disorder Identification Test Final Score (AUDIT): 1 The Alcohol Use Disorders Identification Test, Guidelines for Use in Primary Care, Second Edition.   [employer] [facility]). Score between 0-7:  no or low risk or alcohol related problems. Score between 8-15:  moderate risk of alcohol related problems. Score between 16-19:  high risk of alcohol related problems. Score 20 or above:  warrants further diagnostic evaluation for alcohol dependence and treatment.   CLINICAL FACTORS:   Depression:   Hopelessness Impulsivity Alcohol/Substance Abuse/Dependencies Unstable or Poor Therapeutic Relationship Previous Psychiatric Diagnoses and Treatments   Musculoskeletal: Strength & Muscle Tone: within normal limits Gait & Station: normal Patient leans: N/A  Psychiatric Specialty Exam:  Presentation  General Appearance:  Disheveled  Eye Contact: Minimal  Speech: Clear and Coherent  Speech Volume: Normal  Handedness:No data recorded  Mood and Affect  Mood: Irritable (irritable)  Affect: Congruent   Thought Process  Thought Processes: Goal Directed; Linear  Descriptions of Associations:Intact  Orientation:Full (Time, Place and Person)  Thought Content:Logical  History of Schizophrenia/Schizoaffective disorder:No  Duration of Psychotic Symptoms:Greater than six months  Hallucinations:Hallucinations: Auditory; Visual  Ideas of Reference:Paranoia  Suicidal Thoughts:Suicidal Thoughts: Yes, Passive SI Passive Intent and/or Plan: Without Intent; Without Plan  Homicidal Thoughts:Homicidal Thoughts: No   Sensorium  Memory: Immediate Fair; Recent Fair  Judgment: Fair  Insight: Poor   Executive Functions  Concentration:No data recorded Attention Span: Fair  Recall: [employer] of Knowledge: Fair  Language: Fair   Psychomotor Activity  Psychomotor Activity: Psychomotor Activity: Normal   Assets  Assets: Manufacturing Systems Engineer; Desire for Improvement   Sleep  Sleep: Sleep: Fair    Physical Exam: Physical Exam ROS Blood pressure 108/72, pulse (!) 56, temperature 97.8 F  (36.6 C), temperature source Oral, resp. rate 19, height 5' 11 (1.803 m), weight 75.3 kg, SpO2 100%. Body mass index is 23.15 kg/m.   COGNITIVE FEATURES THAT CONTRIBUTE TO RISK:  Thought constriction (tunnel vision)    SUICIDE RISK:   Moderate:  Frequent suicidal ideation with limited intensity, and duration, some specificity in terms of plans, no associated  intent, good self-control, limited dysphoria/symptomatology, some risk factors present, and identifiable protective factors, including available and accessible social support.  PLAN OF CARE: 1.    Safety and Monitoring:   --  Voluntary admission to inpatient psychiatric unit for safety, stabilization and treatment -- Daily contact with patient to assess and evaluate symptoms and progress in treatment -- Patient's case to be discussed in multi-disciplinary team meeting -- Observation Level : q15 minute checks -- Vital signs:  q12 hours -- Precautions: suicide   I certify that inpatient services furnished can reasonably be expected to improve the patient's condition.   Donnice FORBES Right, PA-C 10/12/2024, 3:15 PM

## 2024-10-12 NOTE — Group Note (Signed)
 Date:  10/12/2024 Time:  8:54 PM  Group Topic/Focus:  Orientation:   The focus of this group is to educate the patient on the purpose and policies of crisis stabilization and provide a format to answer questions about their admission.  The group details unit policies and expectations of patients while admitted. Self Care:   The focus of this group is to help patients understand the importance of self-care in order to improve or restore emotional, physical, spiritual, interpersonal, and financial health.    Participation Level:  Active  Participation Quality:  Appropriate and Attentive  Affect:  Appropriate  Cognitive:  Alert and Appropriate  Insight: Appropriate  Engagement in Group:  Improving  Modes of Intervention:  Clarification, Discussion, Education, Orientation, Rapport Building, and Support  Additional Comments:     Danny Turner 10/12/2024, 8:54 PM

## 2024-10-12 NOTE — Group Note (Signed)
 Recreation Therapy Group Note   Group Topic:Stress Management  Group Date: 10/12/2024 Start Time: 1000 End Time: 1040 Facilitators: Celestia Jeoffrey BRAVO, LRT, CTRS Location: Dayroom  Group Description: PMR (Progressive Muscle Relaxation). LRT educates patients on what PMR is and the benefits that come from it. Patients are asked to sit with their feet flat on the floor while sitting up and all the way back in their chair, if possible. LRT and pts follow a prompt through a speaker that requires you to tense and release different muscles in their body and focus on their breathing. During session, lights are off and soft music is being played. Pts are given a stress ball to use if needed.   Goal Area(s) Addressed:  Patients will be able to describe progressive muscle relaxation.  Patient will practice using relaxation technique. Patient will identify a new coping skill.  Patient will follow multistep directions to reduce anxiety and stress.   Affect/Mood: N/A   Participation Level: Did not attend    Clinical Observations/Individualized Feedback: Patient did not attend group.   Plan: Continue to engage patient in RT group sessions 2-3x/week.   Jeoffrey BRAVO Celestia, LRT, CTRS 10/12/2024 11:44 AM

## 2024-10-12 NOTE — [facility] Suicide Risk Assessment (Signed)
 [facility] INPATIENT:  Family/Significant Other Suicide Prevention Education  Suicide Prevention Education:  Patient Refusal for Family/Significant Other Suicide Prevention Education: The patient Danny Turner has refused to provide written consent for family/significant other to be provided Family/Significant Other Suicide Prevention Education during admission and/or prior to discharge.  Physician notified.  SPE completed with pt, as pt refused to consent to family contact. SPI pamphlet provided to pt and pt was encouraged to share information with support network, ask questions, and talk about any concerns relating to SPE. Pt denies access to guns/firearms and verbalized understanding of information provided. Mobile Crisis information also provided to pt.   Sherryle JINNY Margo 10/12/2024, 10:57 AM

## 2024-10-12 NOTE — Progress Notes (Signed)
   10/12/24 0850  Psych Admission Type (Psych Patients Only)  Admission Status Voluntary  Psychosocial Assessment  Patient Complaints Substance abuse  Eye Contact Brief  Facial Expression Angry;Flat  Affect Flat  Speech Argumentative  Interaction Hostile  Motor Activity Other (Comment) (WDL)  Appearance/Hygiene Unremarkable  Behavior Characteristics Cooperative;Guarded;Impulsive  Thought Process  Coherency WDL  Content WDL  Delusions None reported or observed  Perception Hallucinations  Hallucination Auditory;Visual  Judgment Poor  Confusion None  Danger to Self  Current suicidal ideation? Denies  Danger to Others  Danger to Others None reported or observed

## 2024-10-12 NOTE — Progress Notes (Signed)
   10/12/24 1950  Psych Admission Type (Psych Patients Only)  Admission Status Voluntary  Psychosocial Assessment  Patient Complaints Substance abuse  Eye Contact Brief  Facial Expression Flat  Affect Flat  Speech Logical/coherent  Interaction Minimal  Motor Activity Other (Comment) (WNL)  Appearance/Hygiene Unremarkable;In scrubs  Behavior Characteristics Guarded  Mood Sullen  Thought Process  Coherency WDL  Content WDL  Delusions None reported or observed  Perception WDL  Hallucination None reported or observed (denies)  Judgment Poor  Confusion None  Danger to Self  Current suicidal ideation? Denies  Danger to Others  Danger to Others None reported or observed

## 2024-10-13 DIAGNOSIS — F332 Major depressive disorder, recurrent severe without psychotic features: Secondary | ICD-10-CM | POA: Diagnosis not present

## 2024-10-13 DIAGNOSIS — F19959 Other psychoactive substance use, unspecified with psychoactive substance-induced psychotic disorder, unspecified: Secondary | ICD-10-CM | POA: Diagnosis not present

## 2024-10-13 NOTE — B[facility] Counselor (Signed)
 CSW followed up with the reentry program and contacted [place], [phone].  Damien reports that patient needs to come to a Wednesday meeting at 1PM.  Address: [address] [place] [address].  She reports that patient does not need to bring anything to the appointment.    Sherryle Margo, MSW, LCSW 10/13/2024 3:20 PM

## 2024-10-13 NOTE — Group Note (Signed)
 Recreation Therapy Group Note   Group Topic:Leisure Education  Group Date: 10/13/2024 Start Time: 1010 End Time: 1115 Facilitators: Celestia Jeoffrey BRAVO, LRT, CTRS Location: Craft Room  Group Description: Leisure. Patients were given the option to choose from journaling, coloring, drawing, making origami, playing with playdoh, listening to music or singing karaoke. LRT and pts discussed the meaning of leisure, the importance of participating in leisure during their free time/when they're outside of the hospital, as well as how our leisure interests can also serve as coping skills.   Goal Area(s) Addressed:  Patient will identify a current leisure interest.  Patient will learn the definition of "leisure". Patient will practice making a positive decision. Patient will have the opportunity to try a new leisure activity. Patient will communicate with peers and LRT.    Affect/Mood: N/A   Participation Level: Did not attend    Clinical Observations/Individualized Feedback: Patient did not attend group.   Plan: Continue to engage patient in RT group sessions 2-3x/week.   Jeoffrey BRAVO Celestia, LRT, CTRS 10/13/2024 11:46 AM

## 2024-10-13 NOTE — Plan of Care (Signed)
   Problem: Education: Goal: Emotional status will improve Outcome: Not Progressing Goal: Mental status will improve Outcome: Not Progressing Goal: Verbalization of understanding the information provided will improve Outcome: Not Progressing

## 2024-10-13 NOTE — BH IP Treatment Plan (Signed)
 Interdisciplinary Treatment and Diagnostic Plan Update  10/13/2024 Time of Session: 13:34 Danny Turner MRN: [id number]  Principal Diagnosis: MDD (major depressive disorder), recurrent episode, severe (HCC)  Secondary Diagnoses: Principal Problem:   MDD (major depressive disorder), recurrent episode, severe (HCC) Active Problems:   Psychoactive substance-induced psychosis (HCC)   Current Medications:  Current Facility-Administered Medications  Medication Dose Route Frequency Provider Last Rate Last Admin   acetaminophen  (TYLENOL ) tablet 650 mg  650 mg Oral Q6H PRN Lenon Elsie HERO, RPH       alum & mag hydroxide-simeth (MAALOX/MYLANTA) 200-200-20 MG/5ML suspension 30 mL  30 mL Oral Q4H PRN Smith, Annie B, NP       haloperidol  (HALDOL ) tablet 5 mg  5 mg Oral TID PRN Smith, Annie B, NP       And   diphenhydrAMINE  (BENADRYL ) capsule 50 mg  50 mg Oral TID PRN Smith, Annie B, NP       haloperidol  lactate (HALDOL ) injection 5 mg  5 mg Intramuscular TID PRN Smith, Annie B, NP       And   diphenhydrAMINE  (BENADRYL ) injection 50 mg  50 mg Intramuscular TID PRN Smith, Annie B, NP       And   LORazepam  (ATIVAN ) injection 2 mg  2 mg Intramuscular TID PRN Smith, Annie B, NP       haloperidol  lactate (HALDOL ) injection 10 mg  10 mg Intramuscular TID PRN Smith, Annie B, NP       And   diphenhydrAMINE  (BENADRYL ) injection 50 mg  50 mg Intramuscular TID PRN Smith, Annie B, NP       And   LORazepam  (ATIVAN ) injection 2 mg  2 mg Intramuscular TID PRN Smith, Annie B, NP       folic acid  (FOLVITE ) tablet 1 mg  1 mg Oral Daily Smith, Annie B, NP   1 mg at 10/13/24 0845   hydrOXYzine  (ATARAX ) tablet 25 mg  25 mg Oral TID PRN Smith, Annie B, NP       magnesium  hydroxide (MILK OF MAGNESIA) suspension 30 mL  30 mL Oral Daily PRN Smith, Annie B, NP       nicotine  (NICODERM CQ  - dosed in mg/24 hours) patch 14 mg  14 mg Transdermal Daily Jadapalle, Sree, MD   14 mg at 10/12/24 0848   paliperidone   (INVEGA ) 24 hr tablet 3 mg  3 mg Oral Daily Millington, Matthew E, PA-C   3 mg at 10/13/24 0845   thiamine  (VITAMIN B1) tablet 100 mg  100 mg Oral Daily Smith, Annie B, NP   100 mg at 10/13/24 0845   traZODone  (DESYREL ) tablet 50 mg  50 mg Oral QHS PRN Smith, Annie B, NP       PTA Medications: Medications Prior to Admission  Medication Sig Dispense Refill Last Dose/Taking   thiamine  100 MG tablet Take 1 tablet (100 mg total) by mouth daily. (Patient not taking: Reported on 10/13/2021) 30 tablet 0     Patient Stressors: Substance abuse    Patient Strengths: Capable of independent living   Treatment Modalities: Medication Management, Group therapy, Case management,  1 to 1 session with clinician, Psychoeducation, Recreational therapy.   Physician Treatment Plan for Primary Diagnosis: MDD (major depressive disorder), recurrent episode, severe (HCC) Long Term Goal(s): Improvement in symptoms so as ready for discharge   Short Term Goals: Ability to identify changes in lifestyle to reduce recurrence of condition will improve Ability to verbalize feelings will improve Ability to disclose and  discuss suicidal ideas Ability to demonstrate self-control will improve Ability to identify and develop effective coping behaviors will improve Ability to maintain clinical measurements within normal limits will improve Compliance with prescribed medications will improve Ability to identify triggers associated with substance abuse/mental health issues will improve  Medication Management: Evaluate patient's response, side effects, and tolerance of medication regimen.  Therapeutic Interventions: 1 to 1 sessions, Unit Group sessions and Medication administration.  Evaluation of Outcomes: Not Met  Physician Treatment Plan for Secondary Diagnosis: Principal Problem:   MDD (major depressive disorder), recurrent episode, severe (HCC) Active Problems:   Psychoactive substance-induced psychosis  (HCC)  Long Term Goal(s): Improvement in symptoms so as ready for discharge   Short Term Goals: Ability to identify changes in lifestyle to reduce recurrence of condition will improve Ability to verbalize feelings will improve Ability to disclose and discuss suicidal ideas Ability to demonstrate self-control will improve Ability to identify and develop effective coping behaviors will improve Ability to maintain clinical measurements within normal limits will improve Compliance with prescribed medications will improve Ability to identify triggers associated with substance abuse/mental health issues will improve     Medication Management: Evaluate patient's response, side effects, and tolerance of medication regimen.  Therapeutic Interventions: 1 to 1 sessions, Unit Group sessions and Medication administration.  Evaluation of Outcomes: Not Met   RN Treatment Plan for Primary Diagnosis: MDD (major depressive disorder), recurrent episode, severe (HCC) Long Term Goal(s): Knowledge of disease and therapeutic regimen to maintain health will improve  Short Term Goals: Ability to remain free from injury will improve, Ability to verbalize frustration and anger appropriately will improve, Ability to demonstrate self-control, Ability to participate in decision making will improve, Ability to verbalize feelings will improve, Ability to disclose and discuss suicidal ideas, Ability to identify and develop effective coping behaviors will improve, and Compliance with prescribed medications will improve  Medication Management: RN will administer medications as ordered by provider, will assess and evaluate patient's response and provide education to patient for prescribed medication. RN will report any adverse and/or side effects to prescribing provider.  Therapeutic Interventions: 1 on 1 counseling sessions, Psychoeducation, Medication administration, Evaluate responses to treatment, Monitor vital signs and  CBGs as ordered, Perform/monitor CIWA, COWS, AIMS and Fall Risk screenings as ordered, Perform wound care treatments as ordered.  Evaluation of Outcomes: Not Met   LCSW Treatment Plan for Primary Diagnosis: MDD (major depressive disorder), recurrent episode, severe (HCC) Long Term Goal(s): Safe transition to appropriate next level of care at discharge, Engage patient in therapeutic group addressing interpersonal concerns.  Short Term Goals: Engage patient in aftercare planning with referrals and resources, Increase social support, Increase ability to appropriately verbalize feelings, Increase emotional regulation, Facilitate acceptance of mental health diagnosis and concerns, Facilitate patient progression through stages of change regarding substance use diagnoses and concerns, Identify triggers associated with mental health/substance abuse issues, and Increase skills for wellness and recovery  Therapeutic Interventions: Assess for all discharge needs, 1 to 1 time with Social worker, Explore available resources and support systems, Assess for adequacy in community support network, Educate family and significant other(s) on suicide prevention, Complete Psychosocial Assessment, Interpersonal group therapy.  Evaluation of Outcomes: Not Met   Progress in Treatment: Attending groups: No. Participating in groups: No. Taking medication as prescribed: Yes. Toleration medication: Yes. Family/Significant other contact made: No, will contact:  if given permission.  Patient understands diagnosis: Yes. Discussing patient identified problems/goals with staff: Yes. Medical problems stabilized or resolved: Yes. Denies suicidal/homicidal  ideation: Yes. Issues/concerns per patient self-inventory: No. Other: none.   New problem(s) identified: No, Describe:  none identified.  New Short Term/Long Term Goal(s): medication management for mood stabilization; elimination of SI thoughts; development of  comprehensive mental wellness/sobriety plan.  Patient Goals:  I'm just trying to get into a long-term treatment program.   Discharge Plan or Barriers: CSW will assist pt with development of an appropriate aftercare/discharge plan.   Reason for Continuation of Hospitalization: Delusions  Hallucinations Medication stabilization Suicidal ideation  Estimated Length of Stay: 1-7 days  Last 3 [place] Suicide Severity Risk Score: Flowsheet Row Admission (Current) from 10/11/2024 in [facility]PATIENT BEHAVIORAL MEDICINE Most recent reading at 10/11/2024  3:11 PM ED from 10/11/2024 in [facility] Emergency Department at [facility] Most recent reading at 10/11/2024  2:30 AM ED from 03/11/2022 in [facility] Emergency Department at [facility] Most recent reading at 03/11/2022  9:43 AM  C-SSRS RISK CATEGORY Low Risk No Risk No Risk    Last PHQ 2/9 Scores:     No data to display          Scribe for Treatment Team: Nadara JONELLE Fam, LCSW 10/13/2024 3:58 PM

## 2024-10-13 NOTE — Group Note (Signed)
 Date:  10/13/2024 Time:  10:40 AM  Group Topic/Focus:  Dimensions of Wellness:   The focus of this group is to introduce the topic of wellness and discuss the role each dimension of wellness plays in total health.    Participation Level:  Did Not Attend   Danny Turner L [name] 10/13/2024, 10:40 AM

## 2024-10-13 NOTE — [facility] Counselor (Signed)
 CSW followed up with the patient on reviewing rehab programs that he was interested in substance use rehab facilities.   Pt expressed only being interested in [facility].   CSW pointed out that patient has the number and will need to get nurses station to call for him. CSW informed that interview takes about one hour to complete.  CSW informed that information will be sent to [facility], however, patient must complete the interview.   CSW recommended that the patient review the facilities and identify others he may be interested as identifying only one can be problematic.    Pt stated that he was not interested in others due to them being faith based as well as shorter term than a year, which patient wants.  CSW expressed understanding and pointed out that additional referrals can be made by those agencies if additional care is recommended, and pt may have to do a shorter term.  Pt was not pleased with that option as evidenced by self report.    Pt requested CSW refer him out of state.  CSW pointed out that patient would need transportation out of state and his current insurance may be a barrier for out of state facilities.   Pt expressed understanding and stated that he would review further.   Sherryle Margo, MSW, LCSW 10/13/2024 3:17 PM

## 2024-10-13 NOTE — Group Note (Signed)
 Date:  10/13/2024 Time:  8:42 PM  Group Topic/Focus:  Wrap-Up Group:   The focus of this group is to help patients review their daily goal of treatment and discuss progress on daily workbooks.    Participation Level:  Did Not Attend  Participation Quality:  none  Affect:  none  Cognitive:  none  Insight: None  Engagement in Group:  none  Modes of Intervention:  none  Additional Comments:  none   Kerri Katz 10/13/2024, 8:42 PM

## 2024-10-13 NOTE — Group Note (Signed)
 Recreation Therapy Group Note   Group Topic:Other  Group Date: 10/13/2024 Start Time: 1530 End Time: 1620 Facilitators: Celestia Jeoffrey BRAVO, LRT, CTRS Location: Courtyard  Group Description: [employer]. LRT and patients played games of basketball, drew with chalk, and played corn hole while outside in the courtyard while getting fresh air and sunlight. Music was being played in the background. LRT and peers conversed about different games they have played before, what they do in their free time and anything else that is on their minds. LRT encouraged pts to drink water after being outside, sweating and getting their heart rate up.  Goal Area(s) Addressed: Patient will build on frustration tolerance skills. Patients will partake in a competitive play game with peers. Patients will gain knowledge of new leisure interest/hobby.    Affect/Mood: N/A   Participation Level: Did not attend    Clinical Observations/Individualized Feedback: Patient did not attend group.   Plan: Continue to engage patient in RT group sessions 2-3x/week.   Jeoffrey BRAVO Celestia, LRT, CTRS 10/13/2024 5:21 PM

## 2024-10-13 NOTE — Plan of Care (Signed)
   Problem: Coping: Goal: Ability to verbalize frustrations and anger appropriately will improve Outcome: Progressing

## 2024-10-13 NOTE — Progress Notes (Signed)
 [facility] MD Progress Note  10/13/2024 3:40 PM Danny Turner  MRN:  [id number]   Subjective:  Chart reviewed, case discussed in multidisciplinary meeting, patient seen during rounds.   Patient seen for follow-up today they are alert and oriented.  They are found laying in bed.  They are pleasant today.  They deny hallucinations.  They deny SI and HI.  They report they are tolerating medications well with them without adverse effect.  Discussed adding medication for depression and anxiety and they were not agreeable.  Discussed adjusting medications for sleep and they were not agreeable.  They indicate they are interested in residential treatment however on further questioning they report they have not started working to seek residential treatment they are again encouraged and instructed that they need to call the residential treatment facilities.  Patient is reclusive to room.  They have been medication compliant and have not required PRNs.  They are encouraged to attend group sessions and participate in the unit milieu.   Past Psychiatric History: see h&P Family History: History reviewed. No pertinent family history. Social History:  Social History   Substance and Sexual Activity  Alcohol Use Not Currently   Comment: Patient denies regular use or  binge drinking on 10/11/24     Social History   Substance and Sexual Activity  Drug Use Yes   Types: Methamphetamines, Cocaine   Comment: Patient reports subutex and cocaine a few days ago as of 10/11/24    Social History   Socioeconomic History   Marital status: Single    Spouse name: Not on file   Number of children: Not on file   Years of education: Not on file   Highest education level: Not on file  Occupational History   Not on file  Tobacco Use   Smoking status: Every Day    Current packs/day: 0.50    Types: Cigarettes   Smokeless tobacco: Never  Vaping Use   Vaping status: Some Days  Substance and Sexual Activity   Alcohol  use: Not Currently    Comment: Patient denies regular use or  binge drinking on 10/11/24   Drug use: Yes    Types: Methamphetamines, Cocaine    Comment: Patient reports subutex and cocaine a few days ago as of 10/11/24   Sexual activity: Not on file  Other Topics Concern   Not on file  Social History Narrative   Not on file   Social Drivers of Health   Financial Resource Strain: Not on file  Food Insecurity: Food Insecurity Present (10/11/2024)   Hunger Vital Sign    Worried About Running Out of Food in the Last Year: Sometimes true    Ran Out of Food in the Last Year: Sometimes true  Transportation Needs: Unmet Transportation Needs (10/11/2024)   PRAPARE - Administrator, Civil Service (Medical): Yes    Lack of Transportation (Non-Medical): Yes  Physical Activity: Not on file  Stress: Not on file  Social Connections: Not on file   Past Medical History: History reviewed. No pertinent past medical history.  Past Surgical History:  Procedure Laterality Date   APPENDECTOMY     LEG SURGERY Left     Current Medications: Current Facility-Administered Medications  Medication Dose Route Frequency Provider Last Rate Last Admin   acetaminophen  (TYLENOL ) tablet 650 mg  650 mg Oral Q6H PRN Lenon Elsie HERO, RPH       alum & mag hydroxide-simeth (MAALOX/MYLANTA) 200-200-20 MG/5ML suspension 30 mL  30  mL Oral Q4H PRN Smith, Annie B, NP       haloperidol  (HALDOL ) tablet 5 mg  5 mg Oral TID PRN Smith, Annie B, NP       And   diphenhydrAMINE  (BENADRYL ) capsule 50 mg  50 mg Oral TID PRN Smith, Annie B, NP       haloperidol  lactate (HALDOL ) injection 5 mg  5 mg Intramuscular TID PRN Smith, Annie B, NP       And   diphenhydrAMINE  (BENADRYL ) injection 50 mg  50 mg Intramuscular TID PRN Smith, Annie B, NP       And   LORazepam  (ATIVAN ) injection 2 mg  2 mg Intramuscular TID PRN Smith, Annie B, NP       haloperidol  lactate (HALDOL ) injection 10 mg  10 mg Intramuscular TID PRN  Smith, Annie B, NP       And   diphenhydrAMINE  (BENADRYL ) injection 50 mg  50 mg Intramuscular TID PRN Smith, Annie B, NP       And   LORazepam  (ATIVAN ) injection 2 mg  2 mg Intramuscular TID PRN Smith, Annie B, NP       folic acid  (FOLVITE ) tablet 1 mg  1 mg Oral Daily Smith, Annie B, NP   1 mg at 10/13/24 0845   hydrOXYzine  (ATARAX ) tablet 25 mg  25 mg Oral TID PRN Smith, Annie B, NP       magnesium  hydroxide (MILK OF MAGNESIA) suspension 30 mL  30 mL Oral Daily PRN Smith, Annie B, NP       nicotine  (NICODERM CQ  - dosed in mg/24 hours) patch 14 mg  14 mg Transdermal Daily Jadapalle, Sree, MD   14 mg at 10/12/24 0848   paliperidone  (INVEGA ) 24 hr tablet 3 mg  3 mg Oral Daily Lorraine Cimmino E, PA-C   3 mg at 10/13/24 0845   thiamine  (VITAMIN B1) tablet 100 mg  100 mg Oral Daily Smith, Annie B, NP   100 mg at 10/13/24 0845   traZODone  (DESYREL ) tablet 50 mg  50 mg Oral QHS PRN Smith, Annie B, NP        Lab Results: No results found for this or any previous visit (from the past 48 hours).  Blood Alcohol level:  Lab Results  Component Value Date   [facility] <15 10/11/2024   ETH <10 10/13/2021    Metabolic Disorder Labs: Lab Results  Component Value Date   HGBA1C 5.1 08/01/2021   MPG 99.67 08/01/2021   No results found for: PROLACTIN Lab Results  Component Value Date   TRIG 83 08/02/2021    Physical Findings: AIMS:  , ,  ,  ,    CIWA:    COWS:      Psychiatric Specialty Exam:  Presentation  General Appearance:  Disheveled  Eye Contact: Minimal  Speech: Clear and Coherent  Speech Volume: Normal    Mood and Affect  Mood: Irritable (irritable)  Affect: Congruent   Thought Process  Thought Processes: Goal Directed; Linear  Orientation:Full (Time, Place and Person)  Thought Content:Logical  Hallucinations:Hallucinations: Auditory; Visual  Ideas of Reference:Paranoia  Suicidal Thoughts:Suicidal Thoughts: Yes, Passive SI Passive Intent and/or Plan:  Without Intent; Without Plan  Homicidal Thoughts:Homicidal Thoughts: No   Sensorium  Memory: Immediate Fair; Recent Fair  Judgment: Fair  Insight: Poor   Executive Functions  Concentration:No data recorded Attention Span: Fair  Recall: [employer] of Knowledge: Fair  Language: Fair   Psychomotor Activity  Psychomotor Activity: Psychomotor  Activity: Normal  Musculoskeletal: Strength & Muscle Tone: within normal limits Gait & Station: normal Assets  Assets: Manufacturing Systems Engineer; Desire for Improvement    Physical Exam: Physical Exam Vitals and nursing note reviewed.  HENT:     Head: Atraumatic.  Eyes:     Extraocular Movements: Extraocular movements intact.  Pulmonary:     Effort: Pulmonary effort is normal.  Neurological:     Mental Status: He is alert and oriented to person, place, and time.    Review of Systems  Psychiatric/Behavioral:  Positive for depression. Negative for hallucinations, substance abuse and suicidal ideas. The patient is nervous/anxious. The patient does not have insomnia.    Blood pressure 103/74, pulse 92, temperature 98 F (36.7 C), temperature source Oral, resp. rate 16, height 5' 11 (1.803 m), weight 75.3 kg, SpO2 99%. Body mass index is 23.15 kg/m.  Diagnosis: Principal Problem:   MDD (major depressive disorder), recurrent episode, severe (HCC) Active Problems:   Psychoactive substance-induced psychosis (HCC)   PLAN: Safety and Monitoring:  -- Voluntary admission to inpatient psychiatric unit for safety, stabilization and treatment  -- Daily contact with patient to assess and evaluate symptoms and progress in treatment  -- Patient's case to be discussed in multi-disciplinary team meeting  -- Observation Level : q15 minute checks  -- Vital signs:  q12 hours  -- Precautions: suicide, elopement, and assault -- Encouraged patient to participate in unit milieu and in scheduled group therapies  2. Psychiatric  Treatment:  Scheduled Medications:  Continue Invega  3 mg daily Patient is not interested in further medications discussed medications for anxiety, depression, and sleep and patient declined.   -- The risks/benefits/side-effects/alternatives to this medication were discussed in detail with the patient and time was given for questions. The patient consents to medication trial.  3. Medical Issues Being Addressed:   Hospitalist was consulted given chronic hep C and elevated liver enzymes.  Dr. Von spoke with infectious disease and indicated no acute needs stated that they would order labs and that the patient should establish with a PCP for further follow-up.  Patient was advised of this and verbalized understanding.  4. Discharge Planning:   -- Social work and case management to assist with discharge planning and identification of hospital follow-up needs prior to discharge  -- Estimated LOS: 3-4 days I have reviewed this case with Dr. Jadapalle who is agreeable with this plan.  Donnice FORBES Right, PA-C 10/13/2024, 3:40 PM

## 2024-10-13 NOTE — Progress Notes (Signed)
   10/13/24 0845  Psych Admission Type (Psych Patients Only)  Admission Status Voluntary  Psychosocial Assessment  Patient Complaints Substance abuse  Eye Contact Fair  Facial Expression Flat  Affect Flat  Speech Logical/coherent  Interaction Minimal  Motor Activity Slow  Appearance/Hygiene Disheveled  Behavior Characteristics Cooperative  Mood Anxious  Thought Process  Coherency WDL  Content WDL  Delusions None reported or observed  Perception WDL  Hallucination None reported or observed  Judgment Poor  Confusion None  Danger to Self  Current suicidal ideation? Denies  Danger to Others  Danger to Others None reported or observed

## 2024-10-14 DIAGNOSIS — F332 Major depressive disorder, recurrent severe without psychotic features: Secondary | ICD-10-CM | POA: Diagnosis not present

## 2024-10-14 DIAGNOSIS — F19959 Other psychoactive substance use, unspecified with psychoactive substance-induced psychotic disorder, unspecified: Secondary | ICD-10-CM | POA: Diagnosis not present

## 2024-10-14 LAB — HCV RNA QUANT RFLX ULTRA OR GENOTYP

## 2024-10-14 LAB — URINE DRUG SCREEN
Amphetamines: NEGATIVE
Barbiturates: NEGATIVE
Benzodiazepines: NEGATIVE
Cocaine: NEGATIVE
Fentanyl: NEGATIVE
Methadone Scn, Ur: NEGATIVE
Opiates: NEGATIVE
Tetrahydrocannabinol: NEGATIVE

## 2024-10-14 LAB — HEPATITIS C GENOTYPE: Hepatitis C Genotype: 3

## 2024-10-14 LAB — HCV RNA (INTERNATIONAL UNITS)
HCV log10: 7.201 {Log_IU}/mL
Hcv Rna (International Units): 15900000 [IU]/mL

## 2024-10-14 MED ORDER — NICOTINE POLACRILEX 2 MG MT GUM
2.0000 mg | CHEWING_GUM | OROMUCOSAL | Status: DC | PRN
  Administered 2024-10-17 – 2024-10-18 (×3): 2 mg via ORAL
  Filled 2024-10-14 (×3): qty 1

## 2024-10-14 NOTE — Progress Notes (Signed)
 [facility] MD Progress Note  10/14/2024 2:53 PM KEANON BEVINS  MRN:  [id number]   Subjective:  Chart reviewed, case discussed in multidisciplinary meeting, patient seen during rounds.   11/22: Patient seen for follow-up today they are alert and oriented.  They are found laying in bed.  They continue to be interested in residential treatment.  We continued to encourage him to seek placement they are observed calling residential treatment facilities.  They are optimistic that they will get excepted to [facility].  They deny SI HI and AVH.  They have been medication compliant and have not required behavioral PRNs.  They continue to be irritable.  They continue to be reclusive to room.  We continue to encourage him to attend group.  They declined further medication adjustment at this time.  11/21 Patient seen for follow-up today they are alert and oriented.  They are found laying in bed.  They are pleasant today.  They deny hallucinations.  They deny SI and HI.  They report they are tolerating medications well with them without adverse effect.  Discussed adding medication for depression and anxiety and they were not agreeable.  Discussed adjusting medications for sleep and they were not agreeable.  They indicate they are interested in residential treatment however on further questioning they report they have not started working to seek residential treatment they are again encouraged and instructed that they need to call the residential treatment facilities.  Patient is reclusive to room.  They have been medication compliant and have not required PRNs.  They are encouraged to attend group sessions and participate in the unit milieu.   Past Psychiatric History: see h&P Family History: History reviewed. No pertinent family history. Social History:  Social History   Substance and Sexual Activity  Alcohol Use Not Currently   Comment: Patient denies regular use or  binge drinking on 10/11/24     Social History    Substance and Sexual Activity  Drug Use Yes   Types: Methamphetamines, Cocaine   Comment: Patient reports subutex and cocaine a few days ago as of 10/11/24    Social History   Socioeconomic History   Marital status: Single    Spouse name: Not on file   Number of children: Not on file   Years of education: Not on file   Highest education level: Not on file  Occupational History   Not on file  Tobacco Use   Smoking status: Every Day    Current packs/day: 0.50    Types: Cigarettes   Smokeless tobacco: Never  Vaping Use   Vaping status: Some Days  Substance and Sexual Activity   Alcohol use: Not Currently    Comment: Patient denies regular use or  binge drinking on 10/11/24   Drug use: Yes    Types: Methamphetamines, Cocaine    Comment: Patient reports subutex and cocaine a few days ago as of 10/11/24   Sexual activity: Not on file  Other Topics Concern   Not on file  Social History Narrative   Not on file   Social Drivers of Health   Financial Resource Strain: Not on file  Food Insecurity: Food Insecurity Present (10/11/2024)   Hunger Vital Sign    Worried About Running Out of Food in the Last Year: Sometimes true    Ran Out of Food in the Last Year: Sometimes true  Transportation Needs: Unmet Transportation Needs (10/11/2024)   PRAPARE - Administrator, Civil Service (Medical): Yes  Lack of Transportation (Non-Medical): Yes  Physical Activity: Not on file  Stress: Not on file  Social Connections: Not on file   Past Medical History: History reviewed. No pertinent past medical history.  Past Surgical History:  Procedure Laterality Date   APPENDECTOMY     LEG SURGERY Left     Current Medications: Current Facility-Administered Medications  Medication Dose Route Frequency Provider Last Rate Last Admin   acetaminophen  (TYLENOL ) tablet 650 mg  650 mg Oral Q6H PRN Lenon Elsie HERO, RPH   650 mg at 10/14/24 9185   alum & mag hydroxide-simeth  (MAALOX/MYLANTA) 200-200-20 MG/5ML suspension 30 mL  30 mL Oral Q4H PRN Smith, Annie B, NP       haloperidol  (HALDOL ) tablet 5 mg  5 mg Oral TID PRN Smith, Annie B, NP       And   diphenhydrAMINE  (BENADRYL ) capsule 50 mg  50 mg Oral TID PRN Smith, Annie B, NP       haloperidol  lactate (HALDOL ) injection 5 mg  5 mg Intramuscular TID PRN Smith, Annie B, NP       And   diphenhydrAMINE  (BENADRYL ) injection 50 mg  50 mg Intramuscular TID PRN Smith, Annie B, NP       And   LORazepam  (ATIVAN ) injection 2 mg  2 mg Intramuscular TID PRN Smith, Annie B, NP       haloperidol  lactate (HALDOL ) injection 10 mg  10 mg Intramuscular TID PRN Smith, Annie B, NP       And   diphenhydrAMINE  (BENADRYL ) injection 50 mg  50 mg Intramuscular TID PRN Smith, Annie B, NP       And   LORazepam  (ATIVAN ) injection 2 mg  2 mg Intramuscular TID PRN Smith, Annie B, NP       folic acid  (FOLVITE ) tablet 1 mg  1 mg Oral Daily Smith, Annie B, NP   1 mg at 10/14/24 9186   hydrOXYzine  (ATARAX ) tablet 25 mg  25 mg Oral TID PRN Smith, Annie B, NP   25 mg at 10/13/24 1849   magnesium  hydroxide (MILK OF MAGNESIA) suspension 30 mL  30 mL Oral Daily PRN Smith, Annie B, NP       nicotine  (NICODERM CQ  - dosed in mg/24 hours) patch 14 mg  14 mg Transdermal Daily Jadapalle, Sree, MD   14 mg at 10/14/24 9185   paliperidone  (INVEGA ) 24 hr tablet 3 mg  3 mg Oral Daily Salahuddin Arismendez E, PA-C   3 mg at 10/14/24 9186   thiamine  (VITAMIN B1) tablet 100 mg  100 mg Oral Daily Smith, Annie B, NP   100 mg at 10/14/24 0813   traZODone  (DESYREL ) tablet 50 mg  50 mg Oral QHS PRN Smith, Annie B, NP        Lab Results: No results found for this or any previous visit (from the past 48 hours).  Blood Alcohol level:  Lab Results  Component Value Date   [facility] <15 10/11/2024   ETH <10 10/13/2021    Metabolic Disorder Labs: Lab Results  Component Value Date   HGBA1C 5.1 08/01/2021   MPG 99.67 08/01/2021   No results found for: PROLACTIN Lab  Results  Component Value Date   TRIG 83 08/02/2021    Physical Findings: AIMS:  , ,  ,  ,    CIWA:    COWS:      Psychiatric Specialty Exam:  Presentation  General Appearance:  Disheveled  Eye Contact: Minimal  Speech:  Clear and Coherent  Speech Volume: Normal    Mood and Affect  Mood: Irritable (irritable)  Affect: Congruent   Thought Process  Thought Processes: Goal Directed; Linear  Orientation:Full (Time, Place and Person)  Thought Content:Logical  Hallucinations:No data recorded  Ideas of Reference:Paranoia  Suicidal Thoughts:No data recorded  Homicidal Thoughts:No data recorded   Sensorium  Memory: Immediate Fair; Recent Fair  Judgment: Fair  Insight: Poor   Executive Functions  Concentration:No data recorded Attention Span: Fair  Recall: [employer] of Knowledge: Fair  Language: Fair   Psychomotor Activity  Psychomotor Activity: No data recorded  Musculoskeletal: Strength & Muscle Tone: within normal limits Gait & Station: normal Assets  Assets: Manufacturing Systems Engineer; Desire for Improvement    Physical Exam: Physical Exam Vitals and nursing note reviewed.  HENT:     Head: Atraumatic.  Eyes:     Extraocular Movements: Extraocular movements intact.  Pulmonary:     Effort: Pulmonary effort is normal.  Neurological:     Mental Status: He is alert and oriented to person, place, and time.    Review of Systems  Psychiatric/Behavioral:  Positive for depression. Negative for hallucinations, substance abuse and suicidal ideas. The patient is nervous/anxious. The patient does not have insomnia.    Blood pressure 106/71, pulse 69, temperature 98.7 F (37.1 C), temperature source Oral, resp. rate 20, height 5' 11 (1.803 m), weight 75.3 kg, SpO2 100%. Body mass index is 23.15 kg/m.  Diagnosis: Principal Problem:   MDD (major depressive disorder), recurrent episode, severe (HCC) Active Problems:   Psychoactive  substance-induced psychosis (HCC)   PLAN: Safety and Monitoring:  -- Voluntary admission to inpatient psychiatric unit for safety, stabilization and treatment  -- Daily contact with patient to assess and evaluate symptoms and progress in treatment  -- Patient's case to be discussed in multi-disciplinary team meeting  -- Observation Level : q15 minute checks  -- Vital signs:  q12 hours  -- Precautions: suicide, elopement, and assault -- Encouraged patient to participate in unit milieu and in scheduled group therapies  2. Psychiatric Treatment:  Scheduled Medications:  Continue Invega  3 mg daily Patient is not interested in further medications discussed medications for anxiety, depression, and sleep and patient declined.   -- The risks/benefits/side-effects/alternatives to this medication were discussed in detail with the patient and time was given for questions. The patient consents to medication trial.  3. Medical Issues Being Addressed:   Hospitalist was consulted given chronic hep C and elevated liver enzymes.  Dr. Von spoke with infectious disease and indicated no acute needs stated that they would order labs and that the patient should establish with a PCP for further follow-up.  Patient was advised of this and verbalized understanding.  4. Discharge Planning:   -- Social work and case management to assist with discharge planning and identification of hospital follow-up needs prior to discharge  -- Estimated LOS: 3-4 days  Donnice FORBES Right, PA-C 10/14/2024, 2:53 PM

## 2024-10-14 NOTE — Group Note (Signed)
 [facility] LCSW Group Therapy Note   Group Date: 10/14/2024 Start Time: 1330 End Time: 1430   Type of Therapy/Topic:  Group Therapy:  Emotion Regulation  Participation Level:  Active   Mood:  Description of Group:    The purpose of this group is to assist patients in learning to regulate negative emotions and experience positive emotions. Patients will be guided to discuss ways in which they have been vulnerable to their negative emotions. These vulnerabilities will be juxtaposed with experiences of positive emotions or situations, and patients challenged to use positive emotions to combat negative ones. Special emphasis will be placed on coping with negative emotions in conflict situations, and patients will process healthy conflict resolution skills.  Therapeutic Goals: Patient will identify two positive emotions or experiences to reflect on in order to balance out negative emotions:  Patient will label two or more emotions that they find the most difficult to experience:  Patient will be able to demonstrate positive conflict resolution skills through discussion or role plays:   Summary of Patient Progress:   Patient was attentive and engaged during today's group session    Therapeutic Modalities:   Cognitive Behavioral Therapy Feelings Identification Dialectical Behavioral Therapy   [name], LCSW

## 2024-10-14 NOTE — Group Note (Signed)
 Date:  10/14/2024 Time:  8:33 PM  Group Topic/Focus:  Making Healthy Choices:   The focus of this group is to help patients identify negative/unhealthy choices they were using prior to admission and identify positive/healthier coping strategies to replace them upon discharge. Self Care:   The focus of this group is to help patients understand the importance of self-care in order to improve or restore emotional, physical, spiritual, interpersonal, and financial health. Wrap-Up Group:   The focus of this group is to help patients review their daily goal of treatment and discuss progress on daily workbooks.    Participation Level:  Active  Participation Quality:  Appropriate and Attentive  Affect:  Appropriate  Cognitive:  Appropriate and Oriented  Insight: Appropriate  Engagement in Group:  Improving  Modes of Intervention:  Discussion and Support  Additional Comments:  N/A  Danny Turner 10/14/2024, 8:33 PM

## 2024-10-14 NOTE — Plan of Care (Signed)

## 2024-10-14 NOTE — Progress Notes (Signed)
   10/14/24 2100  Psych Admission Type (Psych Patients Only)  Admission Status Voluntary  Psychosocial Assessment  Patient Complaints Anxiety;Depression  Eye Contact Brief  Facial Expression Flat  Affect Flat  Speech Logical/coherent  Interaction Assertive  Motor Activity Slow  Appearance/Hygiene Disheveled  Behavior Characteristics Cooperative  Mood Sullen  Thought Process  Coherency WDL  Content WDL  Delusions None reported or observed  Perception WDL  Hallucination None reported or observed  Judgment Poor  Confusion None  Danger to Self  Current suicidal ideation? Denies  Danger to Others  Danger to Others None reported or observed

## 2024-10-14 NOTE — Plan of Care (Cosign Needed)

## 2024-10-15 DIAGNOSIS — F19959 Other psychoactive substance use, unspecified with psychoactive substance-induced psychotic disorder, unspecified: Secondary | ICD-10-CM | POA: Diagnosis not present

## 2024-10-15 DIAGNOSIS — F332 Major depressive disorder, recurrent severe without psychotic features: Secondary | ICD-10-CM | POA: Diagnosis not present

## 2024-10-15 NOTE — Progress Notes (Addendum)
 [facility] MD Progress Note  10/15/2024 10:35 AM Danny Turner  MRN:  [id number]   Subjective:  Chart reviewed, case discussed in multidisciplinary meeting, patient seen during rounds.  11/23: Pt reports he was told by [name] that he was accepted pending his paperwork. He requested to be discontinued off of Invega  as they will not allow him to be on this medication. An extensive review of the risk of discontinuing this medication was had with the patient he verbalized understanding of the risk and affirmed that he wished to discontinue this medication and was not agreeable with starting any other psychotropic medications. He Denied SI/HI/AVH. He was cooperative on exam. He was linear, logical, and future oriented. Noted stable mood, appetite, and sleep. Rated depression 2/10 and anxiety 3/10. Planned DC Tuesday pending [facility] admission  11/22: Patient seen for follow-up today they are alert and oriented.  They are found laying in bed.  They continue to be interested in residential treatment.  We continued to encourage him to seek placement they are observed calling residential treatment facilities.  They are optimistic that they will get excepted to [facility].  They deny SI HI and AVH.  They have been medication compliant and have not required behavioral PRNs.  They continue to be irritable.  They continue to be reclusive to room.  We continue to encourage him to attend group.  They declined further medication adjustment at this time.  11/21 Patient seen for follow-up today they are alert and oriented.  They are found laying in bed.  They are pleasant today.  They deny hallucinations.  They deny SI and HI.  They report they are tolerating medications well with them without adverse effect.  Discussed adding medication for depression and anxiety and they were not agreeable.  Discussed adjusting medications for sleep and they were not agreeable.  They indicate they are interested in residential treatment however on  further questioning they report they have not started working to seek residential treatment they are again encouraged and instructed that they need to call the residential treatment facilities.  Patient is reclusive to room.  They have been medication compliant and have not required PRNs.  They are encouraged to attend group sessions and participate in the unit milieu.   Past Psychiatric History: see h&P Family History: History reviewed. No pertinent family history. Social History:  Social History   Substance and Sexual Activity  Alcohol Use Not Currently   Comment: Patient denies regular use or  binge drinking on 10/11/24     Social History   Substance and Sexual Activity  Drug Use Yes   Types: Methamphetamines, Cocaine   Comment: Patient reports subutex and cocaine a few days ago as of 10/11/24    Social History   Socioeconomic History   Marital status: Single    Spouse name: Not on file   Number of children: Not on file   Years of education: Not on file   Highest education level: Not on file  Occupational History   Not on file  Tobacco Use   Smoking status: Every Day    Current packs/day: 0.50    Types: Cigarettes   Smokeless tobacco: Never  Vaping Use   Vaping status: Some Days  Substance and Sexual Activity   Alcohol use: Not Currently    Comment: Patient denies regular use or  binge drinking on 10/11/24   Drug use: Yes    Types: Methamphetamines, Cocaine    Comment: Patient reports subutex and cocaine a  few days ago as of 10/11/24   Sexual activity: Not on file  Other Topics Concern   Not on file  Social History Narrative   Not on file   Social Drivers of Health   Financial Resource Strain: Not on file  Food Insecurity: Food Insecurity Present (10/11/2024)   Hunger Vital Sign    Worried About Running Out of Food in the Last Year: Sometimes true    Ran Out of Food in the Last Year: Sometimes true  Transportation Needs: Unmet Transportation Needs  (10/11/2024)   PRAPARE - Administrator, Civil Service (Medical): Yes    Lack of Transportation (Non-Medical): Yes  Physical Activity: Not on file  Stress: Not on file  Social Connections: Not on file   Past Medical History: History reviewed. No pertinent past medical history.  Past Surgical History:  Procedure Laterality Date   APPENDECTOMY     LEG SURGERY Left     Current Medications: Current Facility-Administered Medications  Medication Dose Route Frequency Provider Last Rate Last Admin   acetaminophen  (TYLENOL ) tablet 650 mg  650 mg Oral Q6H PRN Lenon Elsie HERO, RPH   650 mg at 10/14/24 2102   alum & mag hydroxide-simeth (MAALOX/MYLANTA) 200-200-20 MG/5ML suspension 30 mL  30 mL Oral Q4H PRN Smith, Annie B, NP       haloperidol  (HALDOL ) tablet 5 mg  5 mg Oral TID PRN Smith, Annie B, NP       And   diphenhydrAMINE  (BENADRYL ) capsule 50 mg  50 mg Oral TID PRN Smith, Annie B, NP       haloperidol  lactate (HALDOL ) injection 5 mg  5 mg Intramuscular TID PRN Smith, Annie B, NP       And   diphenhydrAMINE  (BENADRYL ) injection 50 mg  50 mg Intramuscular TID PRN Smith, Annie B, NP       And   LORazepam  (ATIVAN ) injection 2 mg  2 mg Intramuscular TID PRN Smith, Annie B, NP       haloperidol  lactate (HALDOL ) injection 10 mg  10 mg Intramuscular TID PRN Smith, Annie B, NP       And   diphenhydrAMINE  (BENADRYL ) injection 50 mg  50 mg Intramuscular TID PRN Smith, Annie B, NP       And   LORazepam  (ATIVAN ) injection 2 mg  2 mg Intramuscular TID PRN Smith, Annie B, NP       folic acid  (FOLVITE ) tablet 1 mg  1 mg Oral Daily Smith, Annie B, NP   1 mg at 10/14/24 0813   hydrOXYzine  (ATARAX ) tablet 25 mg  25 mg Oral TID PRN Smith, Annie B, NP   25 mg at 10/13/24 1849   magnesium  hydroxide (MILK OF MAGNESIA) suspension 30 mL  30 mL Oral Daily PRN Smith, Annie B, NP       nicotine  (NICODERM CQ  - dosed in mg/24 hours) patch 14 mg  14 mg Transdermal Daily Jadapalle, Sree, MD   14 mg at  10/14/24 9185   nicotine  polacrilex (NICORETTE ) gum 2 mg  2 mg Oral PRN Madaram, Kondal R, MD       thiamine  (VITAMIN B1) tablet 100 mg  100 mg Oral Daily Smith, Annie B, NP   100 mg at 10/14/24 0813   traZODone  (DESYREL ) tablet 50 mg  50 mg Oral QHS PRN Smith, Annie B, NP   50 mg at 10/14/24 2102    Lab Results:  Results for orders placed or performed during the hospital  encounter of 10/11/24 (from the past 48 hours)  Urine Drug Screen     Status: None   Collection Time: 10/14/24  5:28 PM  Result Value Ref Range   Opiates NEGATIVE NEGATIVE   Cocaine NEGATIVE NEGATIVE   Benzodiazepines NEGATIVE NEGATIVE   Amphetamines NEGATIVE NEGATIVE   Tetrahydrocannabinol NEGATIVE NEGATIVE   Barbiturates NEGATIVE NEGATIVE   Methadone Scn, Ur NEGATIVE NEGATIVE   Fentanyl  NEGATIVE NEGATIVE    Comment: (NOTE) Drug screen is for Medical Purposes only. Positive results are preliminary only. If confirmation is needed, notify lab within 5 days.  Drug Class                 Cutoff (ng/mL) Amphetamine and metabolites 1000 Barbiturate and metabolites 200 Benzodiazepine              200 Opiates and metabolites     300 Cocaine and metabolites     300 THC                         50 Fentanyl                     5 Methadone                   300  Trazodone  is metabolized in vivo to several metabolites,  including pharmacologically active m-CPP, which is excreted in the  urine.  Immunoassay screens for amphetamines and MDMA have potential  cross-reactivity with these compounds and may provide false positive  result.  Performed at [facility], [address] [place], [place] [address]     Blood Alcohol level:  Lab Results  Component Value Date   [facility] <15 10/11/2024   ETH <10 10/13/2021    Metabolic Disorder Labs: Lab Results  Component Value Date   HGBA1C 5.1 08/01/2021   MPG 99.67 08/01/2021   No results found for: PROLACTIN Lab Results  Component Value Date   TRIG 83  08/02/2021    Physical Findings: AIMS:  , ,  ,  ,    CIWA:    COWS:      Psychiatric Specialty Exam:  Presentation  General Appearance:  Disheveled  Eye Contact: Minimal  Speech: Clear and Coherent  Speech Volume: Normal    Mood and Affect  Mood: Irritable (irritable)  Affect: Congruent   Thought Process  Thought Processes: Goal Directed; Linear  Orientation:Full (Time, Place and Person)  Thought Content:Logical  Hallucinations:No data recorded  Ideas of Reference:Paranoia  Suicidal Thoughts:No data recorded  Homicidal Thoughts:No data recorded   Sensorium  Memory: Immediate Fair; Recent Fair  Judgment: Fair  Insight: Poor   Executive Functions  Concentration:No data recorded Attention Span: Fair  Recall: [employer] of Knowledge: Fair  Language: Fair   Psychomotor Activity  Psychomotor Activity: No data recorded  Musculoskeletal: Strength & Muscle Tone: within normal limits Gait & Station: normal Assets  Assets: Manufacturing Systems Engineer; Desire for Improvement    Physical Exam: Physical Exam Vitals and nursing note reviewed.  HENT:     Head: Atraumatic.  Eyes:     Extraocular Movements: Extraocular movements intact.  Pulmonary:     Effort: Pulmonary effort is normal.  Neurological:     Mental Status: He is alert and oriented to person, place, and time.    Review of Systems  Psychiatric/Behavioral:  Positive for depression. Negative for hallucinations, substance abuse and suicidal ideas. The patient is nervous/anxious. The patient does  not have insomnia.    Blood pressure 116/68, pulse 68, temperature (!) 97.2 F (36.2 C), resp. rate 20, height 5' 11 (1.803 m), weight 75.3 kg, SpO2 98%. Body mass index is 23.15 kg/m.  Diagnosis: Principal Problem:   MDD (major depressive disorder), recurrent episode, severe (HCC) Active Problems:   Psychoactive substance-induced psychosis (HCC)   PLAN: Safety and  Monitoring:  -- Voluntary admission to inpatient psychiatric unit for safety, stabilization and treatment  -- Daily contact with patient to assess and evaluate symptoms and progress in treatment  -- Patient's case to be discussed in multi-disciplinary team meeting  -- Observation Level : q15 minute checks  -- Vital signs:  q12 hours  -- Precautions: suicide, elopement, and assault -- Encouraged patient to participate in unit milieu and in scheduled group therapies  2. Psychiatric Treatment:  Scheduled Medications:  D/c Invega  3 mg daily per pt request, extensive conversation about the risk was had and he affirmed wish to discontinue, and verbalized understanding of risk.   Patient is not interested in further medications discussed medications for anxiety, depression, and sleep and patient declined.   -- The risks/benefits/side-effects/alternatives to this medication were discussed in detail with the patient and time was given for questions. The patient consents to medication trial.  3. Medical Issues Being Addressed:   Hospitalist was consulted given chronic hep C and elevated liver enzymes.  Dr. Von spoke with infectious disease and indicated no acute needs stated that they would order labs and that the patient should establish with a PCP for further follow-up.  Patient was advised of this and verbalized understanding.  4. Discharge Planning:   --Planned Tuesday   -- Social work and case management to assist with discharge planning and identification of hospital follow-up needs prior to discharge  -- Estimated LOS: 3-4 days  Donnice FORBES Right, PA-C 10/15/2024, 10:35 AM

## 2024-10-15 NOTE — Progress Notes (Signed)
   10/15/24 2100  Psych Admission Type (Psych Patients Only)  Admission Status Voluntary  Psychosocial Assessment  Patient Complaints Depression  Eye Contact Brief  Facial Expression Flat  Affect Flat  Speech Logical/coherent  Interaction Minimal  Motor Activity Slow  Appearance/Hygiene Body odor;Disheveled;Poor hygiene  Behavior Characteristics Cooperative  Mood Sullen;Depressed  Thought Process  Coherency WDL  Content WDL  Delusions None reported or observed  Perception WDL  Hallucination None reported or observed  Judgment Poor  Confusion None  Danger to Self  Current suicidal ideation? Denies  Danger to Others  Danger to Others None reported or observed

## 2024-10-15 NOTE — Group Note (Signed)
 Date:  10/15/2024 Time:  9:28 PM  Group Topic/Focus:  Activity Group: The focus of the group is to encourage patients to go outside to the courtyard and get some fresh air and some exercise.    Participation Level:  Active  Participation Quality:  Appropriate  Affect:  Appropriate  Cognitive:  Appropriate  Insight: Appropriate  Engagement in Group:  Engaged  Modes of Intervention:  Activity  Additional Comments:    Danny Turner 10/15/2024, 9:28 PM

## 2024-10-15 NOTE — Group Note (Signed)
 Date:  10/15/2024 Time:  9:37 PM  Group Topic/Focus:  Spirituality:   The focus of this group is to discuss how one's spirituality can aide in recovery.    Participation Level:  Did Not Attend   Camellia HERO Dianna Ewald 10/15/2024, 9:37 PM

## 2024-10-15 NOTE — [facility] Counselor (Signed)
 Patient expressed that he was accepted to [facility] program.   CSW attempted to touch base with program to confirmed acceptance.   CSW unable to make successful contact.   CSW sent over patient's recent drug screening to program at patient's request.   CSW to continue to assess.   Danny Turner, MSW, LCSWA 10/15/2024 12:15 PM

## 2024-10-15 NOTE — Plan of Care (Signed)
   Problem: Physical Regulation: Goal: Ability to maintain clinical measurements within normal limits will improve Outcome: Progressing   Problem: Safety: Goal: Periods of time without injury will increase Outcome: Progressing

## 2024-10-15 NOTE — Plan of Care (Signed)

## 2024-10-15 NOTE — Group Note (Signed)
 Date:  10/15/2024 Time:  8:35 PM    Additional Comments:  Did not attend group.  Danny Turner 10/15/2024, 8:35 PM

## 2024-10-15 NOTE — Progress Notes (Signed)
 Pt refused to come to med room for am meds; stated he just wants to sleep

## 2024-10-15 NOTE — Progress Notes (Signed)
   10/15/24 0800  Psych Admission Type (Psych Patients Only)  Admission Status Voluntary  Psychosocial Assessment  Patient Complaints Anxiety;Depression  Eye Contact Brief  Facial Expression Flat  Affect Flat  Speech Logical/coherent  Interaction Assertive  Motor Activity Slow  Appearance/Hygiene Disheveled  Behavior Characteristics Cooperative  Mood Sullen  Aggressive Behavior  Effect No apparent injury  Thought Process  Coherency WDL  Content WDL  Delusions None reported or observed  Perception WDL  Hallucination None reported or observed  Judgment Poor  Confusion None  Danger to Self  Current suicidal ideation? Denies  Danger to Others  Danger to Others None reported or observed

## 2024-10-16 NOTE — Progress Notes (Signed)
   10/16/24 1200  Psych Admission Type (Psych Patients Only)  Admission Status Voluntary  Psychosocial Assessment  Patient Complaints None  Eye Contact Fair  Facial Expression Flat  Affect Appropriate to circumstance  Speech Logical/coherent  Interaction Superficial  Motor Activity Slow  Appearance/Hygiene Disheveled  Behavior Characteristics Cooperative  Mood Sullen  Thought Process  Coherency WDL  Content WDL  Delusions None reported or observed  Perception WDL  Hallucination None reported or observed  Judgment Limited  Confusion None  Danger to Self  Current suicidal ideation? Denies  Danger to Others  Danger to Others None reported or observed

## 2024-10-16 NOTE — Group Note (Signed)
 Recreation Therapy Group Note   Group Topic:General Recreation  Group Date: 10/16/2024 Start Time: 1400 End Time: 1455 Facilitators: Celestia Jeoffrey BRAVO, LRT, CTRS Location: Courtyard  Group Description: [employer]. LRT and patients played games of basketball, drew with chalk, and played corn hole while outside in the courtyard while getting fresh air and sunlight. Music was being played in the background. LRT and peers conversed about different games they have played before, what they do in their free time and anything else that is on their minds. LRT encouraged pts to drink water after being outside, sweating and getting their heart rate up.  Goal Area(s) Addressed: Patient will build on frustration tolerance skills. Patients will partake in a competitive play game with peers. Patients will gain knowledge of new leisure interest/hobby.   Affect/Mood: N/A   Participation Level: Did not attend    Clinical Observations/Individualized Feedback: Patient did not attend group.   Plan: Continue to engage patient in RT group sessions 2-3x/week.   Jeoffrey BRAVO Celestia, LRT, CTRS 10/16/2024 4:45 PM

## 2024-10-16 NOTE — Progress Notes (Signed)
 [facility] MD Progress Note  10/16/2024 2:56 PM Danny Turner  MRN:  [id number]   Subjective:  Chart reviewed, case discussed in multidisciplinary meeting, patient seen during rounds.   11/24: On interview today, patient is found resting in bed.  He is calm and cooperative, alert and oriented.  Patient reports he is hoping to be accepted at [facility].  He is tolerating current medication regimen well without adverse effects.  He is able to discuss support system, coping skills, and crisis resources.  He denies SI/HI/plan and denies hallucinations.  He rates depression as 4-5 out of 10 and endorses situational anxiety.  He denies access to guns or other lethal means.  He remains linear, logical, and future oriented.  11/23: Pt reports he was told by [name] that he was accepted pending his paperwork. He requested to be discontinued off of Invega  as they will not allow him to be on this medication. An extensive review of the risk of discontinuing this medication was had with the patient he verbalized understanding of the risk and affirmed that he wished to discontinue this medication and was not agreeable with starting any other psychotropic medications. He Denied SI/HI/AVH. He was cooperative on exam. He was linear, logical, and future oriented. Noted stable mood, appetite, and sleep. Rated depression 2/10 and anxiety 3/10. Planned DC Tuesday pending [facility] admission  11/22: Patient seen for follow-up today they are alert and oriented.  They are found laying in bed.  They continue to be interested in residential treatment.  We continued to encourage him to seek placement they are observed calling residential treatment facilities.  They are optimistic that they will get excepted to [facility].  They deny SI HI and AVH.  They have been medication compliant and have not required behavioral PRNs.  They continue to be irritable.  They continue to be reclusive to room.  We continue to encourage him to attend group.  They  declined further medication adjustment at this time.  11/21 Patient seen for follow-up today they are alert and oriented.  They are found laying in bed.  They are pleasant today.  They deny hallucinations.  They deny SI and HI.  They report they are tolerating medications well with them without adverse effect.  Discussed adding medication for depression and anxiety and they were not agreeable.  Discussed adjusting medications for sleep and they were not agreeable.  They indicate they are interested in residential treatment however on further questioning they report they have not started working to seek residential treatment they are again encouraged and instructed that they need to call the residential treatment facilities.  Patient is reclusive to room.  They have been medication compliant and have not required PRNs.  They are encouraged to attend group sessions and participate in the unit milieu.   Past Psychiatric History: see h&P Family History: History reviewed. No pertinent family history. Social History:  Social History   Substance and Sexual Activity  Alcohol Use Not Currently   Comment: Patient denies regular use or  binge drinking on 10/11/24     Social History   Substance and Sexual Activity  Drug Use Yes   Types: Methamphetamines, Cocaine   Comment: Patient reports subutex and cocaine a few days ago as of 10/11/24    Social History   Socioeconomic History   Marital status: Single    Spouse name: Not on file   Number of children: Not on file   Years of education: Not on file  Highest education level: Not on file  Occupational History   Not on file  Tobacco Use   Smoking status: Every Day    Current packs/day: 0.50    Types: Cigarettes   Smokeless tobacco: Never  Vaping Use   Vaping status: Some Days  Substance and Sexual Activity   Alcohol use: Not Currently    Comment: Patient denies regular use or  binge drinking on 10/11/24   Drug use: Yes    Types:  Methamphetamines, Cocaine    Comment: Patient reports subutex and cocaine a few days ago as of 10/11/24   Sexual activity: Not on file  Other Topics Concern   Not on file  Social History Narrative   Not on file   Social Drivers of Health   Financial Resource Strain: Not on file  Food Insecurity: Food Insecurity Present (10/11/2024)   Hunger Vital Sign    Worried About Running Out of Food in the Last Year: Sometimes true    Ran Out of Food in the Last Year: Sometimes true  Transportation Needs: Unmet Transportation Needs (10/11/2024)   PRAPARE - Administrator, Civil Service (Medical): Yes    Lack of Transportation (Non-Medical): Yes  Physical Activity: Not on file  Stress: Not on file  Social Connections: Not on file   Past Medical History: History reviewed. No pertinent past medical history.  Past Surgical History:  Procedure Laterality Date   APPENDECTOMY     LEG SURGERY Left     Current Medications: Current Facility-Administered Medications  Medication Dose Route Frequency Provider Last Rate Last Admin   acetaminophen  (TYLENOL ) tablet 650 mg  650 mg Oral Q6H PRN Lenon Elsie HERO, RPH   650 mg at 10/15/24 1618   alum & mag hydroxide-simeth (MAALOX/MYLANTA) 200-200-20 MG/5ML suspension 30 mL  30 mL Oral Q4H PRN Smith, Annie B, NP       haloperidol  (HALDOL ) tablet 5 mg  5 mg Oral TID PRN Smith, Annie B, NP       And   diphenhydrAMINE  (BENADRYL ) capsule 50 mg  50 mg Oral TID PRN Smith, Annie B, NP       haloperidol  lactate (HALDOL ) injection 5 mg  5 mg Intramuscular TID PRN Smith, Annie B, NP       And   diphenhydrAMINE  (BENADRYL ) injection 50 mg  50 mg Intramuscular TID PRN Smith, Annie B, NP       And   LORazepam  (ATIVAN ) injection 2 mg  2 mg Intramuscular TID PRN Smith, Annie B, NP       haloperidol  lactate (HALDOL ) injection 10 mg  10 mg Intramuscular TID PRN Smith, Annie B, NP       And   diphenhydrAMINE  (BENADRYL ) injection 50 mg  50 mg Intramuscular  TID PRN Smith, Annie B, NP       And   LORazepam  (ATIVAN ) injection 2 mg  2 mg Intramuscular TID PRN Smith, Annie B, NP       folic acid  (FOLVITE ) tablet 1 mg  1 mg Oral Daily Smith, Annie B, NP   1 mg at 10/16/24 9186   hydrOXYzine  (ATARAX ) tablet 25 mg  25 mg Oral TID PRN Smith, Annie B, NP   25 mg at 10/13/24 1849   magnesium  hydroxide (MILK OF MAGNESIA) suspension 30 mL  30 mL Oral Daily PRN Smith, Annie B, NP       nicotine  (NICODERM CQ  - dosed in mg/24 hours) patch 14 mg  14 mg Transdermal Daily  Jadapalle, Sree, MD   14 mg at 10/14/24 9185   nicotine  polacrilex (NICORETTE ) gum 2 mg  2 mg Oral PRN Madaram, Kondal R, MD       thiamine  (VITAMIN B1) tablet 100 mg  100 mg Oral Daily Smith, Annie B, NP   100 mg at 10/16/24 0813   traZODone  (DESYREL ) tablet 50 mg  50 mg Oral QHS PRN Smith, Annie B, NP   50 mg at 10/14/24 2102    Lab Results:  Results for orders placed or performed during the hospital encounter of 10/11/24 (from the past 48 hours)  Urine Drug Screen     Status: None   Collection Time: 10/14/24  5:28 PM  Result Value Ref Range   Opiates NEGATIVE NEGATIVE   Cocaine NEGATIVE NEGATIVE   Benzodiazepines NEGATIVE NEGATIVE   Amphetamines NEGATIVE NEGATIVE   Tetrahydrocannabinol NEGATIVE NEGATIVE   Barbiturates NEGATIVE NEGATIVE   Methadone Scn, Ur NEGATIVE NEGATIVE   Fentanyl  NEGATIVE NEGATIVE    Comment: (NOTE) Drug screen is for Medical Purposes only. Positive results are preliminary only. If confirmation is needed, notify lab within 5 days.  Drug Class                 Cutoff (ng/mL) Amphetamine and metabolites 1000 Barbiturate and metabolites 200 Benzodiazepine              200 Opiates and metabolites     300 Cocaine and metabolites     300 THC                         50 Fentanyl                     5 Methadone                   300  Trazodone  is metabolized in vivo to several metabolites,  including pharmacologically active m-CPP, which is excreted in the  urine.   Immunoassay screens for amphetamines and MDMA have potential  cross-reactivity with these compounds and may provide false positive  result.  Performed at [facility], [address] [place], [place] [address]     Blood Alcohol level:  Lab Results  Component Value Date   [facility] <15 10/11/2024   ETH <10 10/13/2021    Metabolic Disorder Labs: Lab Results  Component Value Date   HGBA1C 5.1 08/01/2021   MPG 99.67 08/01/2021   No results found for: PROLACTIN Lab Results  Component Value Date   TRIG 83 08/02/2021    Physical Findings: AIMS:  , ,  ,  ,    CIWA:    COWS:      Psychiatric Specialty Exam:  Presentation  General Appearance:  Disheveled  Eye Contact: Minimal  Speech: Clear and Coherent  Speech Volume: Normal    Mood and Affect  Mood: Depressed  Affect: Congruent   Thought Process  Thought Processes: Goal Directed; Linear  Orientation:Full (Time, Place and Person)  Thought Content:Logical  Hallucinations: None  Ideas of Reference: None  Suicidal Thoughts: No  Homicidal Thoughts: No   Sensorium  Memory: Immediate Fair; Recent Fair  Judgment: Fair  Insight: Fair   Art Therapist  Concentration: Fair Attention Span: Fair  Recall: [employer] of Knowledge: Fair  Language: Fair   Psychomotor Activity  Psychomotor Activity: Normal  Musculoskeletal: Strength & Muscle Tone: within normal limits Gait & Station: normal Assets  Assets: Manufacturing Systems Engineer; Desire for  Improvement    Physical Exam: Physical Exam Vitals and nursing note reviewed.  HENT:     Head: Atraumatic.  Eyes:     Extraocular Movements: Extraocular movements intact.  Pulmonary:     Effort: Pulmonary effort is normal.  Neurological:     Mental Status: He is alert and oriented to person, place, and time.    Review of Systems  Psychiatric/Behavioral:  Positive for depression. Negative for hallucinations, substance  abuse and suicidal ideas. The patient is nervous/anxious. The patient does not have insomnia.    Blood pressure 111/78, pulse 68, temperature 98.6 F (37 C), temperature source Oral, resp. rate 20, height 5' 11 (1.803 m), weight 75.3 kg, SpO2 100%. Body mass index is 23.15 kg/m.  Diagnosis: Principal Problem:   MDD (major depressive disorder), recurrent episode, severe (HCC) Active Problems:   Psychoactive substance-induced psychosis (HCC)   PLAN: Safety and Monitoring:  -- Voluntary admission to inpatient psychiatric unit for safety, stabilization and treatment  -- Daily contact with patient to assess and evaluate symptoms and progress in treatment  -- Patient's case to be discussed in multi-disciplinary team meeting  -- Observation Level : q15 minute checks  -- Vital signs:  q12 hours  -- Precautions: suicide, elopement, and assault -- Encouraged patient to participate in unit milieu and in scheduled group therapies  2. Psychiatric Treatment:  Scheduled Medications:  D/c Invega  3 mg daily per pt request, extensive conversation about the risk was had and he affirmed wish to discontinue, and verbalized understanding of risk.   Patient is not interested in further medications discussed medications for anxiety, depression, and sleep and patient declined.   -- The risks/benefits/side-effects/alternatives to this medication were discussed in detail with the patient and time was given for questions. The patient consents to medication trial.  3. Medical Issues Being Addressed:   Hospitalist was consulted given chronic hep C and elevated liver enzymes.  Dr. Von spoke with infectious disease and indicated no acute needs stated that they would order labs and that the patient should establish with a PCP for further follow-up.  Patient was advised of this and verbalized understanding.  4. Discharge Planning:   -- Planned Wednesday  -- Social work and case management to assist with discharge  planning and identification of hospital follow-up needs prior to discharge  -- Estimated LOS: 5-7 days  Camelia LITTIE Lukes, PA-C 10/16/2024, 2:56 PM

## 2024-10-16 NOTE — BHH Counselor (Signed)
 CSW called [facility] to follow up on patient's application.   CSW spoke with Mimi, per her the pt's application has NOT been reviewed at this time.  She asked that this writer call to follow up tomorrow, 10/17/2024.  Sherryle Margo, MSW, LCSW 10/16/2024 3:02 PM

## 2024-10-16 NOTE — Plan of Care (Signed)
   Problem: Education: Goal: Emotional status will improve Outcome: Progressing

## 2024-10-16 NOTE — Plan of Care (Signed)
  Problem: Education: Goal: Emotional status will improve Outcome: Progressing   Problem: Education: Goal: Mental status will improve Outcome: Progressing   Problem: Activity: Goal: Interest or engagement in activities will improve Outcome: Progressing Goal: Sleeping patterns will improve Outcome: Progressing   Problem: Coping: Goal: Ability to verbalize frustrations and anger appropriately will improve Outcome: Progressing Goal: Ability to demonstrate self-control will improve Outcome: Progressing   Problem: Safety: Goal: Periods of time without injury will increase Outcome: Progressing

## 2024-10-16 NOTE — Group Note (Deleted)
 Date:  10/16/2024 Time:  5:51 PM  Group Topic/Focus:  Building Self Esteem:   The Focus of this group is helping patients become aware of the effects of self-esteem on their lives, the things they and others do that enhance or undermine their self-esteem, seeing the relationship between their level of self-esteem and the choices they make and learning ways to enhance self-esteem.     Participation Level:  {BHH PARTICIPATION OZCZO:77735}  Participation Quality:  {BHH PARTICIPATION QUALITY:22265}  Affect:  {BHH AFFECT:22266}  Cognitive:  {BHH COGNITIVE:22267}  Insight: {BHH Insight2:20797}  Engagement in Group:  {BHH ENGAGEMENT IN HMNLE:77731}  Modes of Intervention:  {BHH MODES OF INTERVENTION:22269}  Additional Comments:  ***  Camellia HERO Michi Herrmann 10/16/2024, 5:51 PM

## 2024-10-16 NOTE — Progress Notes (Signed)
   10/16/24 2000  Psych Admission Type (Psych Patients Only)  Admission Status Voluntary  Psychosocial Assessment  Patient Complaints None  Eye Contact Fair  Facial Expression Anxious  Affect Appropriate to circumstance  Speech Logical/coherent  Interaction Assertive  Motor Activity Slow  Appearance/Hygiene Disheveled  Behavior Characteristics Cooperative  Mood Sullen  Thought Process  Coherency WDL  Content WDL  Delusions None reported or observed  Perception WDL  Hallucination None reported or observed  Judgment Limited  Confusion None  Danger to Self  Current suicidal ideation? Denies  Danger to Others  Danger to Others None reported or observed

## 2024-10-16 NOTE — Group Note (Signed)
 Date:  10/16/2024 Time:  6:03 PM  Group Topic/Focus:  Goals Group:   The focus of this group is to help patients establish daily goals to achieve during treatment and discuss how the patient can incorporate goal setting into their daily lives to aide in recovery.     Danny Turner 10/16/2024, 6:03 PM

## 2024-10-16 NOTE — Group Note (Signed)
 LCSW Group Therapy Note  Group Date: 10/16/2024 Start Time: 1300 End Time: 1400   Type of Therapy and Topic:  Group Therapy: Anger Cues and Responses  Participation Level:  Did Not Attend   Description of Group:   In this group, patients learned how to recognize the physical, cognitive, emotional, and behavioral responses they have to anger-provoking situations.  They identified a recent time they became angry and how they reacted.  They analyzed how their reaction was possibly beneficial and how it was possibly unhelpful.  The group discussed a variety of healthier coping skills that could help with such a situation in the future.  Focus was placed on how helpful it is to recognize the underlying emotions to our anger, because working on those can lead to a more permanent solution as well as our ability to focus on the important rather than the urgent.  Therapeutic Goals: Patients will remember their last incident of anger and how they felt emotionally and physically, what their thoughts were at the time, and how they behaved. Patients will identify how their behavior at that time worked for them, as well as how it worked against them. Patients will explore possible new behaviors to use in future anger situations. Patients will learn that anger itself is normal and cannot be eliminated, and that healthier reactions can assist with resolving conflict rather than worsening situations.  Summary of Patient Progress:   Patient declined to attend group.   Therapeutic Modalities:   Cognitive Behavioral Therapy    Sherryle JINNY Margo, LCSW 10/16/2024  3:17 PM

## 2024-10-17 NOTE — Group Note (Signed)
 Recreation Therapy Group Note   Group Topic:Coping Skills  Group Date: 10/17/2024 Start Time: 1000 End Time: 1050 Facilitators: Celestia Jeoffrey BRAVO, LRT, CTRS Location: Craft Room  Group Description: Mind Map.  Patient was provided a blank template of a diagram with 32 blank boxes in a tiered system, branching from the center (similar to a bubble chart). LRT directed patients to label the middle of the diagram Coping Skills. LRT and patients then came up with 8 different coping skills as examples. Pt were directed to record their coping skills in the 2nd tier boxes closest to the center.  Patients would then share their coping skills with the group as LRT wrote them out. LRT gave a handout of 99 different coping skills at the end of group.   Goal Area(s) Addressed: Patients will be able to define "coping skills". Patient will identify new coping skills.  Patient will increase communication.   Affect/Mood: N/A   Participation Level: Did not attend    Clinical Observations/Individualized Feedback: Patient did not attend group.   Plan: Continue to engage patient in RT group sessions 2-3x/week.   Jeoffrey BRAVO Celestia, LRT, CTRS 10/17/2024 12:44 PM

## 2024-10-17 NOTE — Plan of Care (Signed)
  Problem: Education: Goal: Knowledge of [place] [facility] Education information/materials will improve Outcome: Progressing   Problem: Education: Goal: Emotional status will improve Outcome: Progressing   Problem: Education: Goal: Mental status will improve Outcome: Progressing   Problem: Education: Goal: Verbalization of understanding the information provided will improve Outcome: Progressing   Problem: Activity: Goal: Sleeping patterns will improve Outcome: Progressing   Problem: Coping: Goal: Ability to verbalize frustrations and anger appropriately will improve Outcome: Progressing

## 2024-10-17 NOTE — BHH Counselor (Addendum)
 ADDENDUM Patient has been ACCEPTED to [facility] for 10/17/2024.  [employer]  [facility]: [address] [place], [place] [address]  Sherryle Margo, MSW, LCSW 10/17/2024 3:51 PM     CSW spoke with [facility] regarding the patient's referral.  CSW was informed that pt was tentatively ACCEPTED with no medications at discharge.   Intake staff requested that patient call back on 10/18/2024 at 8AM to determine facility that patient will be going to.  CSW to update the patient.    Sherryle Margo, MSW, LCSW 10/17/2024 3:26 PM

## 2024-10-17 NOTE — Progress Notes (Signed)
   10/16/24 2000  Psych Admission Type (Psych Patients Only)  Admission Status Voluntary  Psychosocial Assessment  Patient Complaints None  Eye Contact Fair  Facial Expression Anxious  Affect Appropriate to circumstance  Speech Logical/coherent  Interaction Assertive  Motor Activity Slow  Appearance/Hygiene Disheveled  Behavior Characteristics Cooperative  Mood Sullen  Thought Process  Coherency WDL  Content WDL  Delusions None reported or observed  Perception WDL  Hallucination None reported or observed  Judgment Limited  Confusion None  Danger to Self  Current suicidal ideation? Denies  Danger to Others  Danger to Others None reported or observed

## 2024-10-17 NOTE — BHH Counselor (Addendum)
 ADDENDUM CSW contacted [facility].  At this time no updates to patient's chart.  Sherryle Margo, MSW, LCSW 10/17/2024 1:40 PM   CSW followed up with [facility].   Per staff the information has not been reviewed.   Sherryle Margo, MSW, LCSW 10/17/2024 8:59 AM

## 2024-10-17 NOTE — Group Note (Signed)
 Date:  10/17/2024 Time:  1:57 AM  Group Topic/Focus:  Emotional Education:   The focus of this group is to discuss what feelings/emotions are, and how they are experienced. Wrap-Up Group:   The focus of this group is to help patients review their daily goal of treatment and discuss progress on daily workbooks.    Participation Level:  Active  Participation Quality:  Appropriate and Attentive  Affect:  Appropriate  Cognitive:  Alert and Appropriate  Insight: Appropriate and Good  Engagement in Group:  Engaged  Modes of Intervention:  Education  Additional Comments:     Arlester CHRISTELLA Servant 10/17/2024, 1:57 AM

## 2024-10-17 NOTE — Group Note (Signed)
 Date:  10/17/2024 Time:  10:07 AM  Group Topic/Focus:  Coping With Mental Health Crisis:   The purpose of this group is to help patients identify strategies for coping with mental health crisis.  Group discusses possible causes of crisis and ways to manage them effectively.    Participation Level:  Did Not Attend  Leigh VEAR Pais 10/17/2024, 10:07 AM

## 2024-10-17 NOTE — Group Note (Signed)
 [facility] LCSW Group Therapy Note   Group Date: 10/17/2024 Start Time: 1300 End Time: 1400  Type of Therapy/Topic:  Group Therapy:  Feelings about Diagnosis  Participation Level:  Did Not Attend   Description of Group:    This group will allow patients to explore their thoughts and feelings about diagnoses they have received. Patients will be guided to explore their level of understanding and acceptance of these diagnoses. Facilitator will encourage patients to process their thoughts and feelings about the reactions of others to their diagnosis, and will guide patients in identifying ways to discuss their diagnosis with significant others in their lives. This group will be process-oriented, with patients participating in exploration of their own experiences as well as giving and receiving support and challenge from other group members.   Therapeutic Goals: 1. Patient will demonstrate understanding of diagnosis as evidence by identifying two or more symptoms of the disorder:  2. Patient will be able to express two feelings regarding the diagnosis 3. Patient will demonstrate ability to communicate their needs through discussion and/or role plays  Summary of Patient Progress: Patient did not attend group.   Therapeutic Modalities:   Cognitive Behavioral Therapy Brief Therapy Feelings Identification    Nadara JONELLE Fam, LCSW

## 2024-10-17 NOTE — Progress Notes (Signed)
 [facility] MD Progress Note  10/17/2024 5:08 PM Danny Turner  MRN:  [id number]   Subjective:  Chart reviewed, case discussed in multidisciplinary meeting, patient seen during rounds.   11/25: On interview today, patient noted to be calm and cooperative, alert and oriented.  He denies current depressive symptoms and reports situational anxiety about upcoming transition to [facility].  He has been accepted to [facility].  He denies SI/HI/plan and denies hallucinations.  He denies worsening of symptoms since discontinuing Invega .  He reports stable sleep and appetite.  He remains linear, logical, and future oriented.  He is able to discuss coping skills and crisis resources.  He has maintained safe behaviors on the unit.  He does not voice any concerns or complaints at this time.  11/24: On interview today, patient is found resting in bed.  He is calm and cooperative, alert and oriented.  Patient reports he is hoping to be accepted at [facility]  He is tolerating current medication regimen well without adverse effects.  He is able to discuss support system, coping skills, and crisis resources.  He denies SI/HI/plan and denies hallucinations.  He rates depression as 4-5 out of 10 and endorses situational anxiety.  He denies access to guns or other lethal means.  He remains linear, logical, and future oriented.  11/23: Pt reports he was told by [name] that he was accepted pending his paperwork. He requested to be discontinued off of Invega  as they will not allow him to be on this medication. An extensive review of the risk of discontinuing this medication was had with the patient he verbalized understanding of the risk and affirmed that he wished to discontinue this medication and was not agreeable with starting any other psychotropic medications. He Denied SI/HI/AVH. He was cooperative on exam. He was linear, logical, and future oriented. Noted stable mood, appetite, and sleep. Rated depression 2/10 and anxiety 3/10. Planned  DC Tuesday pending [facility] admission  11/22: Patient seen for follow-up today they are alert and oriented.  They are found laying in bed.  They continue to be interested in residential treatment.  We continued to encourage him to seek placement they are observed calling residential treatment facilities.  They are optimistic that they will get excepted to [facility].  They deny SI HI and AVH.  They have been medication compliant and have not required behavioral PRNs.  They continue to be irritable.  They continue to be reclusive to room.  We continue to encourage him to attend group.  They declined further medication adjustment at this time.  11/21 Patient seen for follow-up today they are alert and oriented.  They are found laying in bed.  They are pleasant today.  They deny hallucinations.  They deny SI and HI.  They report they are tolerating medications well with them without adverse effect.  Discussed adding medication for depression and anxiety and they were not agreeable.  Discussed adjusting medications for sleep and they were not agreeable.  They indicate they are interested in residential treatment however on further questioning they report they have not started working to seek residential treatment they are again encouraged and instructed that they need to call the residential treatment facilities.  Patient is reclusive to room.  They have been medication compliant and have not required PRNs.  They are encouraged to attend group sessions and participate in the unit milieu.   Past Psychiatric History: see h&P Family History: History reviewed. No pertinent family history. Social History:  Social History  Substance and Sexual Activity  Alcohol Use Not Currently   Comment: Patient denies regular use or  binge drinking on 10/11/24     Social History   Substance and Sexual Activity  Drug Use Yes   Types: Methamphetamines, Cocaine   Comment: Patient reports subutex and cocaine a few days ago as of  10/11/24    Social History   Socioeconomic History   Marital status: Single    Spouse name: Not on file   Number of children: Not on file   Years of education: Not on file   Highest education level: Not on file  Occupational History   Not on file  Tobacco Use   Smoking status: Every Day    Current packs/day: 0.50    Types: Cigarettes   Smokeless tobacco: Never  Vaping Use   Vaping status: Some Days  Substance and Sexual Activity   Alcohol use: Not Currently    Comment: Patient denies regular use or  binge drinking on 10/11/24   Drug use: Yes    Types: Methamphetamines, Cocaine    Comment: Patient reports subutex and cocaine a few days ago as of 10/11/24   Sexual activity: Not on file  Other Topics Concern   Not on file  Social History Narrative   Not on file   Social Drivers of Health   Financial Resource Strain: Not on file  Food Insecurity: Food Insecurity Present (10/11/2024)   Hunger Vital Sign    Worried About Running Out of Food in the Last Year: Sometimes true    Ran Out of Food in the Last Year: Sometimes true  Transportation Needs: Unmet Transportation Needs (10/11/2024)   PRAPARE - Administrator, Civil Service (Medical): Yes    Lack of Transportation (Non-Medical): Yes  Physical Activity: Not on file  Stress: Not on file  Social Connections: Not on file   Past Medical History: History reviewed. No pertinent past medical history.  Past Surgical History:  Procedure Laterality Date   APPENDECTOMY     LEG SURGERY Left     Current Medications: Current Facility-Administered Medications  Medication Dose Route Frequency Provider Last Rate Last Admin   acetaminophen  (TYLENOL ) tablet 650 mg  650 mg Oral Q6H PRN Lenon Elsie HERO, RPH   650 mg at 10/17/24 1331   alum & mag hydroxide-simeth (MAALOX/MYLANTA) 200-200-20 MG/5ML suspension 30 mL  30 mL Oral Q4H PRN Smith, Annie B, NP       haloperidol  (HALDOL ) tablet 5 mg  5 mg Oral TID PRN Smith,  Annie B, NP       And   diphenhydrAMINE  (BENADRYL ) capsule 50 mg  50 mg Oral TID PRN Smith, Annie B, NP       haloperidol  lactate (HALDOL ) injection 5 mg  5 mg Intramuscular TID PRN Smith, Annie B, NP       And   diphenhydrAMINE  (BENADRYL ) injection 50 mg  50 mg Intramuscular TID PRN Smith, Annie B, NP       And   LORazepam  (ATIVAN ) injection 2 mg  2 mg Intramuscular TID PRN Smith, Annie B, NP       haloperidol  lactate (HALDOL ) injection 10 mg  10 mg Intramuscular TID PRN Smith, Annie B, NP       And   diphenhydrAMINE  (BENADRYL ) injection 50 mg  50 mg Intramuscular TID PRN Smith, Annie B, NP       And   LORazepam  (ATIVAN ) injection 2 mg  2 mg Intramuscular TID PRN  Smith, Annie B, NP       folic acid  (FOLVITE ) tablet 1 mg  1 mg Oral Daily Smith, Annie B, NP   1 mg at 10/16/24 0813   hydrOXYzine  (ATARAX ) tablet 25 mg  25 mg Oral TID PRN Smith, Annie B, NP   25 mg at 10/13/24 1849   magnesium  hydroxide (MILK OF MAGNESIA) suspension 30 mL  30 mL Oral Daily PRN Smith, Annie B, NP       nicotine  (NICODERM CQ  - dosed in mg/24 hours) patch 14 mg  14 mg Transdermal Daily Jadapalle, Sree, MD   14 mg at 10/14/24 9185   nicotine  polacrilex (NICORETTE ) gum 2 mg  2 mg Oral PRN Madaram, Kondal R, MD   2 mg at 10/17/24 1544   thiamine  (VITAMIN B1) tablet 100 mg  100 mg Oral Daily Smith, Annie B, NP   100 mg at 10/16/24 9186   traZODone  (DESYREL ) tablet 50 mg  50 mg Oral QHS PRN Smith, Annie B, NP   50 mg at 10/16/24 2128    Lab Results:  No results found for this or any previous visit (from the past 48 hours).   Blood Alcohol level:  Lab Results  Component Value Date   [facility] <15 10/11/2024   ETH <10 10/13/2021    Metabolic Disorder Labs: Lab Results  Component Value Date   HGBA1C 5.1 08/01/2021   MPG 99.67 08/01/2021   No results found for: PROLACTIN Lab Results  Component Value Date   TRIG 83 08/02/2021    Physical Findings: AIMS:  , ,  ,  ,    CIWA:    COWS:      Psychiatric  Specialty Exam:  Presentation  General Appearance:  Casual  Eye Contact: Fair  Speech: Clear and Coherent  Speech Volume: Normal    Mood and Affect  Mood: Euthymic  Affect: Appropriate   Thought Process  Thought Processes: Goal Directed; Linear  Orientation:Full (Time, Place and Person)  Thought Content:Logical  Hallucinations: None  Ideas of Reference: None  Suicidal Thoughts: No  Homicidal Thoughts: No   Sensorium  Memory: Immediate Fair; Recent Fair  Judgment: Fair  Insight: Fair   Art Therapist  Concentration: Fair Attention Span: Fair  Recall: [employer] of Knowledge: Fair  Language: Fair   Psychomotor Activity  Psychomotor Activity: Normal  Musculoskeletal: Strength & Muscle Tone: within normal limits Gait & Station: normal Assets  Assets: Manufacturing Systems Engineer; Desire for Improvement    Physical Exam: Physical Exam Vitals and nursing note reviewed.  HENT:     Head: Atraumatic.  Eyes:     Extraocular Movements: Extraocular movements intact.  Pulmonary:     Effort: Pulmonary effort is normal.  Neurological:     Mental Status: He is alert and oriented to person, place, and time.    Review of Systems  Psychiatric/Behavioral:  Negative for depression, hallucinations, substance abuse and suicidal ideas. The patient is nervous/anxious. The patient does not have insomnia.    Blood pressure 124/68, pulse 83, temperature 98.9 F (37.2 C), temperature source Oral, resp. rate 18, height 5' 11 (1.803 m), weight 75.3 kg, SpO2 98%. Body mass index is 23.15 kg/m.  Diagnosis: Principal Problem:   MDD (major depressive disorder), recurrent episode, severe (HCC) Active Problems:   Psychoactive substance-induced psychosis (HCC)   PLAN: Safety and Monitoring:  -- Voluntary admission to inpatient psychiatric unit for safety, stabilization and treatment  -- Daily contact with patient to assess and evaluate symptoms  and progress in treatment  -- Patient's case to be discussed in multi-disciplinary team meeting  -- Observation Level : q15 minute checks  -- Vital signs:  q12 hours  -- Precautions: suicide, elopement, and assault -- Encouraged patient to participate in unit milieu and in scheduled group therapies  2. Psychiatric Treatment:  Scheduled Medications:  D/c Invega  3 mg daily per pt request, extensive conversation about the risk was had and he affirmed wish to discontinue, and verbalized understanding of risk.   Patient is not interested in further medications discussed medications for anxiety, depression, and sleep and patient declined.   -- The risks/benefits/side-effects/alternatives to this medication were discussed in detail with the patient and time was given for questions. The patient consents to medication trial.  3. Medical Issues Being Addressed:   Hospitalist was consulted given chronic hep C and elevated liver enzymes.  Dr. Von spoke with infectious disease and indicated no acute needs stated that they would order labs and that the patient should establish with a PCP for further follow-up.  Patient was advised of this and verbalized understanding.  4. Discharge Planning:   -- Planned Wednesday  -- Social work and case management to assist with discharge planning and identification of hospital follow-up needs prior to discharge  -- Estimated LOS: 5-7 days  Camelia LITTIE Lukes, PA-C 10/17/2024, 5:08 PM

## 2024-10-17 NOTE — Progress Notes (Signed)
   10/17/24 0825  Psych Admission Type (Psych Patients Only)  Admission Status Voluntary  Psychosocial Assessment  Patient Complaints None  Eye Contact Brief  Facial Expression Flat  Affect Irritable  Speech Logical/coherent  Interaction Minimal  Motor Activity Other (Comment) (WDL)  Appearance/Hygiene Disheveled  Behavior Characteristics Irritable  Mood Irritable  Thought Process  Coherency WDL  Content WDL  Delusions None reported or observed  Perception WDL  Hallucination None reported or observed  Judgment Poor  Confusion None  Danger to Self  Current suicidal ideation? Denies  Agreement Not to Harm Self Yes  Description of Agreement Verbal

## 2024-10-17 NOTE — Group Note (Signed)
 Date:  10/17/2024 Time:  8:41 PM  Group Topic/Focus:  Goals Group:   The focus of this group is to help patients establish daily goals to achieve during treatment and discuss how the patient can incorporate goal setting into their daily lives to aide in recovery.    Participation Level:  Active  Participation Quality:  Appropriate, Attentive, and Sharing  Affect:  Appropriate  Cognitive:  Alert and Appropriate  Insight: Appropriate, Good, and Improving  Engagement in Group:  Developing/Improving and Engaged  Modes of Intervention:  Clarification, Discussion, Education, Rapport Building, and Support  Additional Comments:     Tiquan Bouch 10/17/2024, 8:41 PM

## 2024-10-17 NOTE — Plan of Care (Signed)

## 2024-10-17 NOTE — BHH Counselor (Signed)
 CSW received call from [facility] who had questions on patient's medications.  CSW explained the medications with the assistance of RN.  However [facility] remained confused.   [facility] requested email with the prescribed medications.    CSW has sent the requested information.   Sherryle Margo, MSW, LCSW 10/17/2024 1:59 PM

## 2024-10-18 MED ORDER — FOLIC ACID 1 MG PO TABS: 1.0000 mg | ORAL_TABLET | Freq: Every day | ORAL | Status: AC

## 2024-10-18 MED ORDER — THIAMINE HCL 100 MG PO TABS: 100.0000 mg | ORAL_TABLET | Freq: Every day | ORAL | Status: AC

## 2024-10-18 NOTE — Discharge Summary (Signed)
 Physician Discharge Summary Note  Patient:  Danny Turner is an 33 y.o., male MRN:  [id number] DOB:  September 22, 1991 Patient phone:  [phone] (home)  Patient address:   [address] [place] [address],   Total time spent: 40 min Date of Admission:  10/11/2024 Date of Discharge: 10/18/2024  Reason for Admission: Suicidal ideation, worsening psychosis and depression in the context of recent release from prison and subsequent homelessness  Principal Problem: MDD (major depressive disorder), recurrent episode, severe (HCC) Discharge Diagnoses: Principal Problem:   MDD (major depressive disorder), recurrent episode, severe (HCC) Active Problems:   Psychoactive substance-induced psychosis (HCC)   Past Psychiatric History: see h&p  Family Psychiatric  History: see h&p Social History:  Social History   Substance and Sexual Activity  Alcohol Use Not Currently   Comment: Patient denies regular use or  binge drinking on 10/11/24     Social History   Substance and Sexual Activity  Drug Use Yes   Types: Methamphetamines, Cocaine   Comment: Patient reports subutex and cocaine a few days ago as of 10/11/24    Social History   Socioeconomic History   Marital status: Single    Spouse name: Not on file   Number of children: Not on file   Years of education: Not on file   Highest education level: Not on file  Occupational History   Not on file  Tobacco Use   Smoking status: Every Day    Current packs/day: 0.50    Types: Cigarettes   Smokeless tobacco: Never  Vaping Use   Vaping status: Some Days  Substance and Sexual Activity   Alcohol use: Not Currently    Comment: Patient denies regular use or  binge drinking on 10/11/24   Drug use: Yes    Types: Methamphetamines, Cocaine    Comment: Patient reports subutex and cocaine a few days ago as of 10/11/24   Sexual activity: Not on file  Other Topics Concern   Not on file  Social History Narrative   Not on file    Social Drivers of Health   Financial Resource Strain: Not on file  Food Insecurity: Food Insecurity Present (10/11/2024)   Hunger Vital Sign    Worried About Running Out of Food in the Last Year: Sometimes true    Ran Out of Food in the Last Year: Sometimes true  Transportation Needs: Unmet Transportation Needs (10/11/2024)   PRAPARE - Administrator, Civil Service (Medical): Yes    Lack of Transportation (Non-Medical): Yes  Physical Activity: Not on file  Stress: Not on file  Social Connections: Not on file   Past Medical History: History reviewed. No pertinent past medical history.  Past Surgical History:  Procedure Laterality Date   APPENDECTOMY     LEG SURGERY Left    Family History: History reviewed. No pertinent family history.  Hospital Course:    During hospitalization, the patient remained alert, oriented, and cooperative, though initially reclusive to his room and intermittently irritable. He consistently denied suicidal or homicidal ideation, hallucinations, and psychotic symptoms on serial interviews, and he maintained safe behaviors on the unit. The patient reported stable sleep and appetite throughout the stay and described depressive and anxiety symptoms that improved over time. He expressed strong interest in transitioning to a residential recovery program and became increasingly engaged in this plan. He was accepted to [facility]. After requesting discontinuation of Invega  due to [facility] acceptance, a thorough discussion of risks and alternatives was completed; the  patient demonstrated understanding and declined initiation of other psychotropic medications. Over subsequent days, he continued to show stable mood, linear and logical thought processes, future orientation, and appropriate insight, denying any worsening of symptoms off Invega . By discharge, he exhibited safe behavior, effective use of coping strategies, and readiness for step-down to residential recovery  treatment. He was able to discuss crisis resources and denied access to firearms or other lethal means.   Patient declines prescriptions for medications upon discharge, scheduled medications upon discharge include thiamine , folic acid , and nicotine  replacement.   Detailed risk assessment is complete based on clinical exam and individual risk factors and acute suicide risk is low and acute violence risk is low.    On the day of discharge, patient denies SI/HI/plan and denies hallucinations.  Patient remains future oriented and is willing to participate in outpatient mental health services.  Currently, all modifiable risk of harm to self/harm to others have been addressed and patient is no longer appropriate for the acute inpatient setting and is able to continue treatment for mental health needs in the community with the supports as indicated below.  Patient is educated and verbalized understanding of discharge plan of care including medications, follow-up appointments, mental health resources and further crisis services in the community.  He is instructed to call 911 or present to the nearest emergency room should he experience any decompensation in mood, disturbance of bowel or return of suicidal/homicidal ideations.  Patient verbalizes understanding of this education and agrees to this plan of care  Physical Findings: AIMS:  , ,  ,  ,    CIWA:    COWS:      Psychiatric Specialty Exam:  Presentation  General Appearance:  Appropriate for Environment  Eye Contact: Good  Speech: Clear and Coherent  Speech Volume: Normal    Mood and Affect  Mood: Euthymic  Affect: Appropriate   Thought Process  Thought Processes: Coherent; Linear  Descriptions of Associations:Intact  Orientation:Full (Time, Place and Person)  Thought Content:Logical  Hallucinations:Hallucinations: None  Ideas of Reference:None  Suicidal Thoughts:Suicidal Thoughts: No  Homicidal Thoughts:Homicidal  Thoughts: No   Sensorium  Memory: Immediate Fair; Recent Fair  Judgment: Fair  Insight: Fair   Art Therapist  Concentration: Good  Attention Span: Good  Recall: Good  Fund of Knowledge: Fair  Language: Fair   Psychomotor Activity  Psychomotor Activity: Psychomotor Activity: Normal  Musculoskeletal: Strength & Muscle Tone: within normal limits Gait & Station: normal Assets  Assets: Manufacturing Systems Engineer; Desire for Improvement   Sleep  Sleep: Sleep: Good    Physical Exam: Physical Exam Vitals and nursing note reviewed.  Constitutional:      Appearance: Normal appearance.  HENT:     Head: Normocephalic and atraumatic.     Nose: Nose normal.  Eyes:     Conjunctiva/sclera: Conjunctivae normal.  Pulmonary:     Effort: Pulmonary effort is normal.  Neurological:     Mental Status: He is alert and oriented to person, place, and time.  Psychiatric:        Attention and Perception: Attention and perception normal. He does not perceive auditory or visual hallucinations.        Mood and Affect: Mood and affect normal.        Speech: Speech normal.        Behavior: Behavior is cooperative.        Thought Content: Thought content normal. Thought content is not paranoid or delusional. Thought content does not include homicidal or suicidal  ideation. Thought content does not include homicidal or suicidal plan.        Cognition and Memory: Cognition normal.        Judgment: Judgment normal.    Review of Systems  Psychiatric/Behavioral:  Positive for depression. Negative for hallucinations and suicidal ideas. The patient is not nervous/anxious and does not have insomnia.    Blood pressure 120/80, pulse (!) 53, temperature 97.9 F (36.6 C), temperature source Oral, resp. rate 16, height 5' 11 (1.803 m), weight 75.3 kg, SpO2 100%. Body mass index is 23.15 kg/m.   Social History   Tobacco Use  Smoking Status Every Day   Current packs/day: 0.50    Types: Cigarettes  Smokeless Tobacco Never   Tobacco Cessation:  A prescription for an FDA-approved tobacco cessation medication was offered at discharge and the patient refused   Blood Alcohol level:  Lab Results  Component Value Date   [facility] <15 10/11/2024   ETH <10 10/13/2021    Metabolic Disorder Labs:  Lab Results  Component Value Date   HGBA1C 5.1 08/01/2021   MPG 99.67 08/01/2021   No results found for: PROLACTIN Lab Results  Component Value Date   TRIG 83 08/02/2021    See Psychiatric Specialty Exam and Suicide Risk Assessment completed by Attending Physician prior to discharge.  Discharge destination:  Other:  [facility]  Is patient on multiple antipsychotic therapies at discharge:  No   Has Patient had three or more failed trials of antipsychotic monotherapy by history:  No  Recommended Plan for Multiple Antipsychotic Therapies: NA  Discharge Instructions     Diet - low sodium heart healthy   Complete by: As directed    Increase activity slowly   Complete by: As directed       Allergies as of 10/18/2024       Reactions   Penicillins         Medication List     TAKE these medications      Indication  folic acid  1 MG tablet Commonly known as: FOLVITE  Take 1 tablet (1 mg total) by mouth daily. Start taking on: October 19, 2024  Indication: Folate deficiency   thiamine  100 MG tablet Commonly known as: VITAMIN B1 Take 1 tablet (100 mg total) by mouth daily.  Indication: Deficiency of Vitamin B1        Follow-up Information     [facility] Follow up.   Why: You have been accepted to [facility] at thier [place] location for 10/18/2024 Contact information: [address]  [place], [place] [address] Phone: [phone]                Follow-up recommendations:  Activity:  as tolerated    Signed: Camelia LITTIE Lukes, PA-C 10/18/2024, 9:27 AM

## 2024-10-18 NOTE — Progress Notes (Signed)
   10/18/24 0750  Psych Admission Type (Psych Patients Only)  Admission Status Voluntary  Psychosocial Assessment  Patient Complaints None  Eye Contact Brief  Facial Expression Flat  Affect Irritable  Speech Logical/coherent  Interaction Minimal  Motor Activity Other (Comment) (WDL)  Appearance/Hygiene Disheveled  Behavior Characteristics Cooperative  Mood Sullen  Aggressive Behavior  Effect No apparent injury  Thought Process  Coherency WDL  Content WDL  Delusions None reported or observed  Perception WDL  Hallucination None reported or observed  Judgment Poor  Confusion None  Danger to Self  Current suicidal ideation? Denies  Agreement Not to Harm Self Yes  Description of Agreement Verbal  Danger to Others  Danger to Others None reported or observed

## 2024-10-18 NOTE — Plan of Care (Signed)
  Problem: Education: Goal: Knowledge of [facility] Education information/materials will improve Outcome: Progressing   Problem: Activity: Goal: Sleeping patterns will improve Outcome: Progressing   Problem: Coping: Goal: Ability to verbalize frustrations and anger appropriately will improve Outcome: Progressing   Problem: Coping: Goal: Ability to demonstrate self-control will improve Outcome: Progressing

## 2024-10-18 NOTE — [facility] Suicide Risk Assessment (Signed)
 [facility] Discharge Suicide Risk Assessment   Principal Problem: MDD (major depressive disorder), recurrent episode, severe (HCC) Discharge Diagnoses: Principal Problem:   MDD (major depressive disorder), recurrent episode, severe (HCC) Active Problems:   Psychoactive substance-induced psychosis (HCC)   Total Time spent with patient: 30 minutes  Musculoskeletal: Strength & Muscle Tone: within normal limits Gait & Station: normal Patient leans: N/A  Psychiatric Specialty Exam  Presentation  General Appearance:  Appropriate for Environment  Eye Contact: Good  Speech: Clear and Coherent  Speech Volume: Normal  Handedness:No data recorded  Mood and Affect  Mood: Euthymic  Duration of Depression Symptoms: Greater than two weeks  Affect: Appropriate   Thought Process  Thought Processes: Coherent; Linear  Descriptions of Associations:Intact  Orientation:Full (Time, Place and Person)  Thought Content:Logical  History of Schizophrenia/Schizoaffective disorder:No  Duration of Psychotic Symptoms:Greater than six months  Hallucinations:Hallucinations: None  Ideas of Reference:None  Suicidal Thoughts:Suicidal Thoughts: No  Homicidal Thoughts:Homicidal Thoughts: No   Sensorium  Memory: Immediate Fair; Recent Fair  Judgment: Fair  Insight: Fair   Art Therapist  Concentration: Good  Attention Span: Good  Recall: Good  Fund of Knowledge: Fair  Language: Fair   Psychomotor Activity  Psychomotor Activity: Psychomotor Activity: Normal   Assets  Assets: Communication Skills; Desire for Improvement   Sleep  Sleep: Sleep: Good  Estimated Sleeping Duration (Last 24 Hours): 6.50-7.75 hours  Physical Exam: Physical Exam ROS Blood pressure 120/80, pulse (!) 53, temperature 97.9 F (36.6 C), temperature source Oral, resp. rate 16, height 5' 11 (1.803 m), weight 75.3 kg, SpO2 100%. Body mass index is 23.15 kg/m.  Mental Status  Per Nursing Assessment::   On Admission:  NA  Demographic Factors:  Male, Caucasian, Low socioeconomic status, and Unemployed  Loss Factors: NA  Historical Factors: Prior suicide attempts and Family history of mental illness or substance abuse  Risk Reduction Factors:   Positive coping skills or problem solving skills  Continued Clinical Symptoms:  Depression:   Impulsivity  Cognitive Features That Contribute To Risk:  None    Suicide Risk:  Minimal: No identifiable suicidal ideation.  Patients presenting with no risk factors but with morbid ruminations; may be classified as minimal risk based on the severity of the depressive symptoms   Follow-up Information     [facility] Follow up.   Why: You have been accepted to [facility] at thier [place] location for 10/18/2024 Contact information: [address]  [place], [place] [address] Phone: [phone]                Plan Of Care/Follow-up recommendations:  Activity:  as tolerated   Patsie Mccardle L Fotios Amos, PA-C 10/18/2024, 9:22 AM

## 2024-10-18 NOTE — Plan of Care (Signed)
   Problem: Education: Goal: Knowledge of [facility] Education information/materials will improve Outcome: Completed/Met Goal: Emotional status will improve Outcome: Completed/Met Goal: Mental status will improve Outcome: Completed/Met Goal: Verbalization of understanding the information provided will improve Outcome: Completed/Met   Problem: Activity: Goal: Interest or engagement in activities will improve Outcome: Completed/Met Goal: Sleeping patterns will improve Outcome: Completed/Met   Problem: Coping: Goal: Ability to verbalize frustrations and anger appropriately will improve Outcome: Completed/Met Goal: Ability to demonstrate self-control will improve Outcome: Completed/Met   Problem: Health Behavior/Discharge Planning: Goal: Identification of resources available to assist in meeting health care needs will improve Outcome: Completed/Met Goal: Compliance with treatment plan for underlying cause of condition will improve Outcome: Completed/Met   Problem: Physical Regulation: Goal: Ability to maintain clinical measurements within normal limits will improve Outcome: Completed/Met   Problem: Safety: Goal: Periods of time without injury will increase Outcome: Completed/Met

## 2024-10-18 NOTE — Progress Notes (Addendum)
  [facility] Adult Case Management Discharge Plan :  Will you be returning to the same living situation after discharge:  No.  Pt has been admitted to [facility].  At discharge, do you have transportation home?: Yes,  CSW to assist with transportation needs.  Do you have the ability to pay for your medications: Yes,  [employer]  Release of information consent forms completed and in the chart;  Patient's signature needed at discharge.  Patient to Follow up at:  Follow-up Information     [facility] Follow up.   Why: You have been accepted to [facility] at thier [place] location for 10/18/2024 Contact information: [address]  [place], [place] [address] Phone: [phone]                Next level of care provider has access to [facility] Link:no  Safety Planning and Suicide Prevention discussed: Yes,  SPE completed with the patient.  Patient declined collateral contact     Has patient been referred to the Quitline?: Patient refused referral for treatment  Patient has been referred for addiction treatment: Yes, the patient will follow up with an inpatient provider for substance use disorder. Therapist: appointment made  Sherryle JINNY Margo, LCSW 10/18/2024, 9:10 AM

## 2024-10-18 NOTE — Progress Notes (Signed)
   10/17/24 2100  Psych Admission Type (Psych Patients Only)  Admission Status Voluntary  Psychosocial Assessment  Patient Complaints None  Eye Contact Brief  Facial Expression Flat  Affect Irritable  Speech Logical/coherent  Interaction Minimal  Motor Activity Other (Comment) (wdl)  Appearance/Hygiene Disheveled  Behavior Characteristics Cooperative  Mood Sullen  Aggressive Behavior  Effect No apparent injury  Thought Process  Coherency WDL  Content WDL  Delusions None reported or observed  Perception WDL  Hallucination None reported or observed  Judgment Poor  Confusion None  Danger to Self  Current suicidal ideation? Denies  Agreement Not to Harm Self Yes  Description of Agreement verbal
